# Patient Record
Sex: Female | Born: 1940 | Race: White | Hispanic: No | Smoking: Former smoker
Health system: Southern US, Community
[De-identification: ages and names within clinical notes are randomized; demographics above are authoritative.]

## PROBLEM LIST (undated history)

## (undated) DIAGNOSIS — I219 Acute myocardial infarction, unspecified: Secondary | ICD-10-CM

## (undated) DIAGNOSIS — I10 Essential (primary) hypertension: Secondary | ICD-10-CM

## (undated) DIAGNOSIS — E785 Hyperlipidemia, unspecified: Secondary | ICD-10-CM

## (undated) DIAGNOSIS — T8859XA Other complications of anesthesia, initial encounter: Secondary | ICD-10-CM

## (undated) DIAGNOSIS — M1990 Unspecified osteoarthritis, unspecified site: Secondary | ICD-10-CM

## (undated) DIAGNOSIS — E039 Hypothyroidism, unspecified: Secondary | ICD-10-CM

## (undated) DIAGNOSIS — C801 Malignant (primary) neoplasm, unspecified: Secondary | ICD-10-CM

## (undated) DIAGNOSIS — I2510 Atherosclerotic heart disease of native coronary artery without angina pectoris: Secondary | ICD-10-CM

## (undated) HISTORY — DX: Essential (primary) hypertension: I10

## (undated) HISTORY — DX: Hyperlipidemia, unspecified: E78.5

## (undated) HISTORY — DX: Acute myocardial infarction, unspecified: I21.9

## (undated) HISTORY — PX: EYE SURGERY: SHX253

## (undated) HISTORY — PX: CORONARY ANGIOPLASTY: SHX604

## (undated) HISTORY — PX: TONSILLECTOMY: SUR1361

## (undated) HISTORY — PX: TUBAL LIGATION: SHX77

---

## 1996-08-29 HISTORY — PX: CARPAL TUNNEL RELEASE: SHX101

## 1998-07-27 ENCOUNTER — Ambulatory Visit (HOSPITAL_COMMUNITY): Admission: RE | Admit: 1998-07-27 | Discharge: 1998-07-27 | Payer: Self-pay | Admitting: Obstetrics and Gynecology

## 2004-06-30 ENCOUNTER — Ambulatory Visit (HOSPITAL_BASED_OUTPATIENT_CLINIC_OR_DEPARTMENT_OTHER): Admission: RE | Admit: 2004-06-30 | Discharge: 2004-06-30 | Payer: Self-pay | Admitting: *Deleted

## 2004-06-30 ENCOUNTER — Ambulatory Visit (HOSPITAL_COMMUNITY): Admission: RE | Admit: 2004-06-30 | Discharge: 2004-06-30 | Payer: Self-pay | Admitting: *Deleted

## 2005-08-09 ENCOUNTER — Ambulatory Visit: Payer: Self-pay | Admitting: Sports Medicine

## 2008-08-29 HISTORY — PX: FOOT TENDON SURGERY: SHX958

## 2013-03-13 ENCOUNTER — Telehealth: Payer: Self-pay | Admitting: Cardiology

## 2013-03-13 ENCOUNTER — Other Ambulatory Visit: Payer: Self-pay | Admitting: Cardiology

## 2013-03-13 NOTE — Telephone Encounter (Signed)
Note made in error 03/13/13.  Corine Shelter PA-C 03/13/2013 1:58 PM

## 2017-05-29 ENCOUNTER — Ambulatory Visit (INDEPENDENT_AMBULATORY_CARE_PROVIDER_SITE_OTHER): Payer: Medicare Other | Admitting: Sports Medicine

## 2017-05-29 ENCOUNTER — Encounter: Payer: Self-pay | Admitting: Sports Medicine

## 2017-05-29 DIAGNOSIS — R269 Unspecified abnormalities of gait and mobility: Secondary | ICD-10-CM | POA: Diagnosis not present

## 2017-05-29 DIAGNOSIS — M2170 Unequal limb length (acquired), unspecified site: Secondary | ICD-10-CM | POA: Diagnosis not present

## 2017-05-29 DIAGNOSIS — M4120 Other idiopathic scoliosis, site unspecified: Secondary | ICD-10-CM | POA: Diagnosis not present

## 2017-05-29 NOTE — Progress Notes (Signed)
   Subjective:    Patient ID: Jessica [name], female    DOB: 14-Sep-1940, 76 y.o.   MRN: [id number]  HPI Patient known to this office, previously evaluated for scoliosis and back pain. Now, after seeing PT for several visits, and being diligent with 6 months with back exercises (1 hr per day x 6 days per week x 6 months), she feels like her gait is "off." She does have a known leg length discrepancy, with L longer than R. She notes some L knee lateral knee pain, as well as R ankle weakness, which is not new and she attributes to her hammer toe surgery in the past. She walked with her friend who is a doctor of PT, who noted that the patient was "limping." She has custom lifts on all of her shoes, reports that these were made to correct 3/4".    Feels gait is off Lateral pain on left hip  Review of Systems    no sciatica No back pain Objective:   Physical Exam BP 132/70   Ht 5\' 1"  (1.549 m)   Wt 104 lb (47.2 kg)   BMI 19.65 kg/m  Pleasant very fit older lady Back: scoliosis with curve to patient's right, L SI joint sits higher than R at rest without shoes.  Anterior pelvic rotation on L, posterior rotation on R pelvis.  Leg length: ASIS to med malleolus on L 85.25cm, R 84cm. Gait: L trendenlenberg gait (L hip drops on ambulation) with shoes on (shoes with existing lift).  Note her shift with the correction she has is to her long leg     Assessment & Plan:  Leg length discrepancy Likely 2/2 scoliosis. R leg shortened by approx 1cm. Given corrective lift insoles (patient will need to purchase new shoes without lift) to correct 3/8". Patient to try this with tennis shoes x 1 month and return for additional corrective insoles if this improves gait. Also given strengthening exercises (standing hip rotation, lateral and cross over step ups, and lying lateral leg raises) to strengthen hips.    Ralene Ok, MD  I observed and examined the patient with the resident and agree with  assessment and plan.  Note reviewed and modified by me. Stefanie Libel, MD

## 2017-05-29 NOTE — Assessment & Plan Note (Signed)
Likely 2/2 scoliosis. R leg shortened by approx 1cm. Given corrective lift insoles (patient will need to purchase new shoes without lift) to correct 3/8". Patient to try this with tennis shoes x 1 month and return for additional corrective insoles if this improves gait. Also given strengthening exercises (standing hip rotation, lateral and cross over step ups, and lying lateral leg raises) to strengthen hips.

## 2017-06-29 ENCOUNTER — Ambulatory Visit (INDEPENDENT_AMBULATORY_CARE_PROVIDER_SITE_OTHER): Payer: Medicare Other | Admitting: Sports Medicine

## 2017-06-29 ENCOUNTER — Encounter: Payer: Self-pay | Admitting: Sports Medicine

## 2017-06-29 DIAGNOSIS — M2170 Unequal limb length (acquired), unspecified site: Secondary | ICD-10-CM | POA: Diagnosis not present

## 2017-06-29 DIAGNOSIS — R269 Unspecified abnormalities of gait and mobility: Secondary | ICD-10-CM

## 2017-06-29 NOTE — Progress Notes (Signed)
   HPI  CC: Follow-up scoliosis and leg length discrepancy Patient is presenting today for follow-up regarding her scoliosis and gait abnormality secondary to her leg length discrepancy.  She states that she has had some improvement with the shoe inserts that she was provided at the last visit.  She states that the new shoes that she purchased have begun to push her "outward" which ultimately forced her to add some additional padding to the lateral aspect of 1 of her inserts.  Ultimately she states that she has had some improvement since her last visit but still does not "walk like I used to".  She denies any new injury, trauma, or falls.  No weakness, numbness, or paresthesias.  Medications/Interventions Tried: Shoe inserts and home exercises  See HPI and/or previous note for associated ROS.  ROS No sciatica No numbness ovr feet  Objective: There were no vitals taken for this visit. YDX:AJOINOMV W Ft. NAD, well groomed, a/o x3, normal affect.  CV: Well-perfused. Warm.  Resp: Non-labored.  Neuro: Sensation intact throughout. No foot drop gross coordination deficits.  Obvious scoliosis with good compensation Gait: without correction she has a trendelenbrug gait., broad base gait with no correction; unremarkable stride without signs of limp or balance issues.  Patient was fitted for a : standard, cushioned, semi-rigid orthotic. The orthotic was heated and afterward the patient stood on the orthotic blank positioned on the orthotic stand. The patient was positioned in subtalar neutral position and 10 degrees of ankle dorsiflexion in a weight bearing stance. After completion of molding, a stable base was applied to the orthotic blank. The blank was ground to a stable position for weight bearing. Size: 7 red EVA Base: On RT we used blue medium density EVA On left/ black thinner EVA that is firm and has a taper Posting:  none Additional orthotic padding:  Post orthotic gait  Now able to  walk with better comfort and no trendelenburg  Assessment and plan:   See problem list   Elberta Leatherwood, MD,MS [facility] 06/29/2017 10:32 AM

## 2017-06-29 NOTE — Assessment & Plan Note (Signed)
Custom orthotics made today The sports insoles helped but she feels much more support in the custom orthotics  Use thes for regular exercise and walking  We may need to add a lift to dress shoes

## 2020-05-19 ENCOUNTER — Ambulatory Visit (INDEPENDENT_AMBULATORY_CARE_PROVIDER_SITE_OTHER): Payer: Medicare Other | Admitting: Sports Medicine

## 2020-05-19 ENCOUNTER — Other Ambulatory Visit: Payer: Self-pay

## 2020-05-19 ENCOUNTER — Ambulatory Visit: Payer: Self-pay

## 2020-05-19 ENCOUNTER — Encounter: Payer: Self-pay | Admitting: Sports Medicine

## 2020-05-19 VITALS — BP 102/74 | Ht 61.0 in | Wt 100.0 lb

## 2020-05-19 DIAGNOSIS — M79662 Pain in left lower leg: Secondary | ICD-10-CM

## 2020-05-19 DIAGNOSIS — M79605 Pain in left leg: Secondary | ICD-10-CM

## 2020-05-19 DIAGNOSIS — M2170 Unequal limb length (acquired), unspecified site: Secondary | ICD-10-CM

## 2020-05-19 NOTE — Assessment & Plan Note (Addendum)
Right leg length is shorter and scoliosis amplifies this - added heel lift to right orthotic with a wedge to outside Walking gait improved with this

## 2020-05-19 NOTE — Assessment & Plan Note (Addendum)
[place] scan consistent with peroneus longus tendon/muscle tear, associated with moderate effusion. Given more weight transfer to left LE due to leg discrepancy in setting of scoliosis, considered fibular stress fracture in differential prior to US imaging  - patient to be placed in air cast for 6 weeks  - rest from lifting and pushing/pulling  Ice massage Wear shoes that support LLI Reck 4 weeks with repeat scan  Note anticoagulation may have increased the bleeding from this

## 2020-05-19 NOTE — Progress Notes (Signed)
PCP: Patient, No Pcp Per  Subjective:   HPI: Patient is a 79 y.o. female here for left lateral leg pain that has been present for about 3 weeks.  Patient reports that she was moving throughout the summer with a lot of lifting and pushing.  Patient denies any sensation of pop or sudden onset of pain but does reports pain that was described as "searing" and radiating from the arch of her left foot up to the middle of her calf muscle.  She reports that this pain quickly resolved but has been experiencing soreness on the lateral aspect of her left lower extremity since the initial searing pain episode.  She also reports that she noticed a change in her leg length discrepancy on the right and was under the impression that it had improved.  She reports that she has pain with crossing her leg to the ankle.  She has been able to ambulate normally but has not been wearing her shoes with custom orthotics from about 4 years ago.  ROM Increasing pain on short walks and in the mornings Pain that was severe at night and wakened her Localized to lateral left calf Denies osteoporotic fractures in past      Objective:  Physical Exam:  Gen: awake, alert, NAD, comfortable in exam room Pulm: breathing unlabored Back/Hip:   + scoliosis.  With right hip positioned lower than left  Post. Shift of pelvis TTP at junction of peroneus longus muscle with tendon on left lower extremity.   Full ROM.  Tenderness with dorsiflexion, palpable edema in area of tenderness  Strength LEs 5/5 all muscle groups.   Sensation intact to light touch bilaterally.  Patient with compensated gait for scoliosis She trendelenburg's to Right - her shorter leg She is walking with flat foot strike on left  Ultrasound of left lateral calf There is a high grad tear of the peroneus longus muscle at the musculotendinous junction of the left calf demonstrated by: Marked hypoechoic change and edema in muscle Increased doppler flow Stump  sign with retraction Scanning below level of tear shows some hypoechoic change without disruption and at the lateral malleolus the tendon appears split Fibula does not show a clear cortical disruption or increased hypoechoic change or doppler flow  Impression: 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 Fields, MD   Assessment & Plan:    Leg pain, lateral, left [place] scan consistent with peroneus longus tendon/muscle tear, associated with moderate effusion. Given more weight transfer to left LE due to leg discrepancy in setting of scoliosis, considered fibular stress fracture in differential prior to US imaging  - patient to be placed in air cast for 6 weeks  - rest from lifting and pushing/pulling    Leg length discrepancy Right leg length discrepancy - added heel lift to right orthotic     Orders Placed This Encounter  Procedures   [place] LIMITED JOINT SPACE STRUCTURES LOW LEFT    Standing Status:   Future    Number of Occurrences:   1    Standing Expiration Date:   05/19/2021    Order Specific Question:   Reason for Exam (SYMPTOM  OR DIAGNOSIS REQUIRED)    Answer:   left lower leg pain    Order Specific Question:   Preferred imaging location?    Answer:   Internal    No orders of the defined types were placed in this encounter.  Riki Sheer  Simmons-Robinson, MD [facility], PGY2 05/19/2020 10:33 AM

## 2020-05-19 NOTE — Patient Instructions (Signed)
Peroneus Longus Tendon/Muscle Rupture   You will need to wear the cast on your left leg to assist with healing for the next few weeks and follow up with Dr. Oneida Alar in 4 weeks.   Please purchase a Wrangler compression sleeve to wear on your left leg.   We have also made adjustments to your orthotics to help with walking in the setting of your leg length discrepancy and scoliosis.

## 2020-06-16 ENCOUNTER — Ambulatory Visit: Payer: Medicare PPO | Admitting: Sports Medicine

## 2020-06-16 ENCOUNTER — Other Ambulatory Visit: Payer: Self-pay

## 2020-06-16 VITALS — BP 139/67 | Ht 60.0 in | Wt 100.0 lb

## 2020-06-16 DIAGNOSIS — G8929 Other chronic pain: Secondary | ICD-10-CM

## 2020-06-16 DIAGNOSIS — M25571 Pain in right ankle and joints of right foot: Secondary | ICD-10-CM | POA: Diagnosis not present

## 2020-06-16 DIAGNOSIS — M79662 Pain in left lower leg: Secondary | ICD-10-CM

## 2020-06-16 NOTE — Progress Notes (Signed)
Jessica [name] is a 79 y.o. female who presents to [facility] today for the following:  Left leg pain Patient was seen on 9/21 and found to have high-grade tear of the peroneus longus muscle at musculotendinous junction of the left calf She was placed in an Aircast for 6 weeks and advised to rest from lifting and pushing/pulling She also has a right leg length discrepancy and a heel lift was added to her right orthotic Overall, she feels that she has been having improvement She has been using her Aircast Minimal pain on left with walking now No pain out of aircast today (4 weeks)  Right Ankle Pain and Weakness Reports that within the last month of being here, she has felt that her right foot has had decreased strength Reports that she had an inversion injury of her right ankle when she was walking [place] Reports that many years ago, she had a hammertoe/bunion surgery repair and thinks that ever since then she has been supinating her right foot more so than her left She states that she feels that she needs to give herself more support on the lateral edge of her foot to keep it from supinating and that when she has done this it has helped her Even reports that she feels unstable when walking the dog   PMH reviewed.  ROS as above. Medications reviewed.  Exam:  BP 139/67   Ht 5' (1.524 m)   Wt 100 lb (45.4 kg)   BMI 19.53 kg/m  Gen: Well NAD MSK:  Right Ankle: - Inspection: pes cavus b/l, no obvious swelling or skin changes - Palpation: No TTP at MT heads, no TTP at base of 5th MT, no TTP over cuboid, no tenderness over navicular prominence, no TTP over lateral or medial malleolus b/l.  - Strength: 4/5 strength in dorsiflexion, plantar flexion, inversion, and eversion on right. - ROM: Full ROM b/l - Neuro/vasc: NV intact distally b/l - Special Tests: Negative anterior drawer.  Negative syndesmotic compression.  She has increased prominence of the lateral  portion of her talar done with inversion on right compared to left. She has overall increase in motion with PF and inversion - Gait: hyper-supination of right foot during gait with lateral rocking  Left Leg: Inspection: Slight swelling over left lateral leg and region of peroneus longus. Palpation: Mild tenderness to palpation along peroneus longus muscle and tendon region on lateral left leg/foot. Strength: 5/5 strength in all fields on left foot. ROM: Full ROM bilaterally  Left lateral leg ultrasound Hypoechoic collection within peroneus longus muscle at proximal aspect of muscle belly, decreased from previous ultrasound.   Peroneal tendons at lateral malleolus visualized with decreased size of left peroneus longus tendon compared to right. Only limited tendon fibers noted at SAX behind lateral malleolus and very    Indicates healing tear of left peroneus longus with some retraction from tendon insertion near the lateral malleolus.  Ultrasound and interpretation by Wolfgang Phoenix. Fields, MD and [place] Constable, DO  No results found.   Assessment and Plan: 1) Pain in left lower leg She is healing from her peroneus longus tendon/muscle tear well.  Effusion is improving on ultrasound.  She is also not having as much pain.  We will have her use Aircast as needed for the next 3 weeks.  Advised to walk as she is able.  Chronic pain of right ankle Her pain is most likely secondary to decreased ability of talar  ligaments given that she has significant talar prominence with inversion of her right foot and decreased ability.  With her gait, she does have some supination of her right foot more so than her left, therefore her heel lift was removed from her right orthotic and a lateral heel wedge with a lateral post was placed.  Patient ambulated in the hallway of clinic with improvement in supination and felt improved stability.  She will see how this does over the next 3 weeks, at that time if she feels  as though she is doing well with this and has more stability, she will bring in just she is to be fitted for green insoles that have similar heel wedge and posting.   [place] Constable, D.O.  PGY-3 Family Medicine  06/16/2020 4:31 PM   I observed and examined the patient with the resident and agree with assessment and plan.  Note reviewed and modified by me.  We can also consider ankle support if the insole corrections and orthotics do not stabilize RT ankle Ila Mcgill, MD

## 2020-06-16 NOTE — Assessment & Plan Note (Addendum)
Her pain is most likely secondary to decreased ability of talar ligaments given that she has significant talar prominence with inversion of her right foot and decreased ability.  With her gait, she does have some supination of her right foot more so than her left, therefore her heel lift was removed from her right orthotic and a lateral heel wedge with a lateral post was placed.  Patient ambulated in the hallway of clinic with improvement in supination and felt improved stability.  She will see how this does over the next 3 weeks, at that time if she feels as though she is doing well with this and has more stability, she will bring in dress shoes is to be fitted for green insoles that have similar heel wedge and posting.

## 2020-06-16 NOTE — Assessment & Plan Note (Signed)
She is healing from her peroneus longus tendon/muscle tear well.  Effusion is improving on ultrasound.  She is also not having as much pain.  We will have her use Aircast as needed for the next 3 weeks.  Advised to walk as she is able.

## 2020-07-07 ENCOUNTER — Other Ambulatory Visit: Payer: Self-pay

## 2020-07-07 ENCOUNTER — Ambulatory Visit: Payer: Medicare PPO | Admitting: Sports Medicine

## 2020-07-07 DIAGNOSIS — M79662 Pain in left lower leg: Secondary | ICD-10-CM | POA: Diagnosis not present

## 2020-07-07 DIAGNOSIS — R269 Unspecified abnormalities of gait and mobility: Secondary | ICD-10-CM | POA: Diagnosis not present

## 2020-07-07 NOTE — Assessment & Plan Note (Signed)
F/U ultrasounds have shown complete tear of peroneus longus She has recovered well Limited pain and no major weakness  We will follow

## 2020-07-07 NOTE — Progress Notes (Signed)
CC; RT foot pain/  F/U peroneus longus rupture  On last visit we added lateral wedge and padding to RT orthotic This clearly helped but she feels that she could have more support Also feels that she gets too much pressure to outside of left foot and wonders if padding would help Walking regularly with less foor pain  Peroneus longus tear quit hurting within days of using long air splint No real pain or swelling now  ROS Does regular exercise for her scoliosis No back pain  PE Thin W F in NAD BP 102/62   Ht 5' (1.524 m)   Wt 100 lb (45.4 kg)   BMI 19.53 kg/m  [place] [facility] Exercise 06/16/2020  Frequency of aerobic exercise (# of days/week) 5  Average time in minutes 30  Frequency of strengthening activities (# of days/week) 2   Patient with obvious scoliosis Gait is quite supinated bilaterally but more on RT Seems steady and turns easily Additon of more lateral padding on RT orthotic  Addition of heel wedge on left orthotic After this she showed improved gait with less supination  Non tender over peroneal tendons No TTP over lateral fibular area  Ultrasound of Peroneal tendons At proximal MSK-tendinous attachment of peroneus longus there remains hypoechoic change, stump sign and retraction distally of tendon on SAX and LAX At malleolus the distal PL tendon appears absent Followed to peroneal tubercle and only minimal tendon fibers noted Hypoechoic seroma in cuboid groove  Impression: complete tear of peronusu longus distally and at proximal M-T attachment  Ultrasound and interpretation by Peterson Ao B. Oneida Alar, MD

## 2020-07-07 NOTE — Assessment & Plan Note (Signed)
With additional lateral wedging and posting she has improved gait Less supination Will try these corrections and follow up

## 2020-07-14 ENCOUNTER — Ambulatory Visit: Payer: Medicare PPO | Admitting: Sports Medicine

## 2020-08-20 ENCOUNTER — Emergency Department
Admission: EM | Admit: 2020-08-20 | Discharge: 2020-08-20 | Disposition: A | Discharge status: Home / Self Care | Payer: Medicare PPO | Attending: Emergency Medicine | Admitting: Emergency Medicine

## 2020-08-20 ENCOUNTER — Emergency Department: Payer: Medicare PPO

## 2020-08-20 DIAGNOSIS — R079 Chest pain, unspecified: Secondary | ICD-10-CM

## 2020-08-20 DIAGNOSIS — G459 Transient cerebral ischemic attack, unspecified: Secondary | ICD-10-CM | POA: Insufficient documentation

## 2020-08-20 DIAGNOSIS — I639 Cerebral infarction, unspecified: Secondary | ICD-10-CM

## 2020-08-20 HISTORY — DX: Cerebral infarction, unspecified: I63.9

## 2020-08-20 LAB — CBC AND DIFFERENTIAL
Absolute NRBC: 0 10*3/uL (ref 0.00–0.00)
Basophils Absolute Automated: 0.03 10*3/uL (ref 0.00–0.08)
Basophils Automated: 0.5 %
Eosinophils Absolute Automated: 0.13 10*3/uL (ref 0.00–0.44)
Eosinophils Automated: 2.4 %
Hematocrit: 47.9 % — ABNORMAL HIGH (ref 34.7–43.7)
Hgb: 15.3 g/dL — ABNORMAL HIGH (ref 11.4–14.8)
Immature Granulocytes Absolute: 0.02 10*3/uL (ref 0.00–0.07)
Immature Granulocytes: 0.4 %
Lymphocytes Absolute Automated: 1.03 10*3/uL (ref 0.42–3.22)
Lymphocytes Automated: 18.8 %
MCH: 29.9 pg (ref 25.1–33.5)
MCHC: 31.9 g/dL (ref 31.5–35.8)
MCV: 93.7 fL (ref 78.0–96.0)
MPV: 10.8 fL (ref 8.9–12.5)
Monocytes Absolute Automated: 0.54 10*3/uL (ref 0.21–0.85)
Monocytes: 9.9 %
Neutrophils Absolute: 3.72 10*3/uL (ref 1.10–6.33)
Neutrophils: 68 %
Nucleated RBC: 0 /100 WBC (ref 0.0–0.0)
Platelets: 266 10*3/uL (ref 142–346)
RBC: 5.11 10*6/uL — ABNORMAL HIGH (ref 3.90–5.10)
RDW: 13 % (ref 11–15)
WBC: 5.47 10*3/uL (ref 3.10–9.50)

## 2020-08-20 LAB — COMPREHENSIVE METABOLIC PANEL
ALT: 19 U/L (ref 0–55)
AST (SGOT): 25 U/L (ref 5–34)
Albumin/Globulin Ratio: 1.4 (ref 0.9–2.2)
Albumin: 4.6 g/dL (ref 3.5–5.0)
Alkaline Phosphatase: 96 U/L (ref 37–106)
Anion Gap: 11 (ref 5.0–15.0)
BUN: 12 mg/dL (ref 7.0–19.0)
Bilirubin, Total: 0.8 mg/dL (ref 0.2–1.2)
CO2: 22 mEq/L (ref 22–29)
Calcium: 10.5 mg/dL — ABNORMAL HIGH (ref 7.9–10.2)
Chloride: 108 mEq/L (ref 100–111)
Creatinine: 0.7 mg/dL (ref 0.6–1.0)
Globulin: 3.3 g/dL (ref 2.0–3.6)
Glucose: 92 mg/dL (ref 70–100)
Potassium: 4.3 mEq/L (ref 3.5–5.1)
Protein, Total: 7.9 g/dL (ref 6.0–8.3)
Sodium: 141 mEq/L (ref 136–145)

## 2020-08-20 LAB — TROPONIN I: Troponin I: <0.01 ng/mL (ref 0.00–0.05)

## 2020-08-20 LAB — PT/INR
PT INR: 1 (ref 0.9–1.1)
PT: 11.4 s (ref 10.1–12.9)

## 2020-08-20 LAB — GFR: EGFR: >60

## 2020-08-20 NOTE — ED Provider Notes (Signed)
[facility] EMERGENCY DEPARTMENT H&P      Visit date: 08/20/2020      CLINICAL SUMMARY          Diagnosis:    .     Final diagnoses:   TIA (transient ischemic attack)         MDM Notes:      Pt with 10 seconds expressive aphasia today  Has RF for CVA  HCT neg    Pt from out of town   Very much does not want to be in the hospital  Is aware of risks    Have discussed with pt and son who have involved her physician daughter  Will [place], pt to return with any new sx, and pt to follow up with neuro at home if she remains asymptomatic  Pt to start ASA      Disposition:         Discharge           I was present for the bedside discharge alongside the patient's ED nurse. Course of visit in the ED and discharge instructions were reviewed with patient and they were given the opportunity to ask any questions regarding their care today. Patient and/ or patient's family verbalized understanding of, and comfort with, instructions and plan.          Discharge Prescriptions     None                        CLINICAL INFORMATION        HPI:      Chief Complaint: Aphasia  .    [place] Rahi Chandonnet is a 79 y.o. female who presents with speech changes  [place] the mall [place] and could not get her works out for < 1 minute  No associated weakness, no changes in balance  Has mild headache  Had heart attack 18 years ago  Not currently on ASA  Was in ED 6 weeks ago for CP, had neg trop x2 and was [place] home  Recently had echo which had some abnormality, but unclear what it was    History obtained from: Patient          ROS:      Positive and negative ROS elements as per HPI.  All other systems reviewed and negative.      Physical Exam:      Pulse 77   BP 168/86   Resp 18   SpO2 99 %   Temp 97.6 F (36.4 C)    Physical Exam  Vitals and nursing note reviewed.   Constitutional:       General: She is not in acute distress.     Appearance: She is well-developed.   HENT:      Head: Normocephalic and atraumatic.   Eyes:      General: No scleral  icterus.     Conjunctiva/sclera: Conjunctivae normal.      Pupils: Pupils are equal, round, and reactive to light.   Cardiovascular:      Rate and Rhythm: Normal rate and regular rhythm.      Heart sounds: Normal heart sounds. No murmur heard.  No friction rub. No gallop.    Pulmonary:      Effort: Pulmonary effort is normal. No respiratory distress.      Breath sounds: Normal breath sounds. No wheezing or rales.   Abdominal:      General: Bowel sounds are normal.  There is no distension.      Palpations: Abdomen is soft.      Tenderness: There is no abdominal tenderness. There is no guarding or rebound.   Musculoskeletal:         General: No deformity. Normal range of motion.      Cervical back: Normal range of motion and neck supple.   Skin:     General: Skin is warm and dry.      Findings: No rash.   Neurological:      Mental Status: She is alert and oriented to person, place, and time.      Cranial Nerves: No cranial nerve deficit.      Comments: Full strength in all extremities, sensation intact throughout  F->N intact  H->S intact  Normal gait, normal tandem gait  No pronator  Nl Romberg                       PAST HISTORY        Primary Care Provider: Pcp, None, MD        PMH/PSH:    .     Past Medical History:   Diagnosis Date    Heart attack     Hyperlipidemia     Hypertension        She has a past surgical history that includes Carpal tunnel release.      Social/Family History:      She reports that she has quit smoking. She does not have any smokeless tobacco history on file. She reports current alcohol use. She reports that she does not use drugs.    History reviewed. No pertinent family history.      Listed Medications on Arrival:    .     Home Medications     Med List Status: In Progress Set By: Jarrett Soho, RN at 08/20/2020  4:40 PM                levothyroxine (SYNTHROID) 100 MCG tablet     Take 100 mcg by mouth Once a day at 6:00am     metoprolol tartrate (LOPRESSOR) 25 MG tablet     Take 25  mg by mouth 2 (two) times daily     ramipril (ALTACE) 10 MG capsule     Take 10 mg by mouth daily         Allergies: She has No Known Allergies.            VISIT INFORMATION        Clinical Course in the ED:            Medications Given in the ED:    .     ED Medication Orders (From admission, onward)    None            Procedures:      Procedures      Interpretations:      Monitor -         interpreted by me: sinus 80s  O2 sat-                   saturation: 100 %; Oxygen use: room air; Interpretation: Normal                  RESULTS        Lab Results:      Results     Procedure Component Value Units Date/Time    Troponin I [[id number]]  Collected: 08/20/20 1642    Specimen: Blood Updated: 08/20/20 1737     Troponin I <0.01 ng/mL     Comprehensive metabolic panel [[id number]]  (Abnormal) Collected: 08/20/20 1642    Specimen: Blood Updated: 08/20/20 1731     Glucose 92 mg/dL      BUN 32.4 mg/dL      Creatinine 0.7 mg/dL      Sodium 401 mEq/L      Potassium 4.3 mEq/L      Chloride 108 mEq/L      CO2 22 mEq/L      Calcium 10.5 mg/dL      Protein, Total 7.9 g/dL      Albumin 4.6 g/dL      AST (SGOT) 25 U/L      ALT 19 U/L      Alkaline Phosphatase 96 U/L      Bilirubin, Total 0.8 mg/dL      Globulin 3.3 g/dL      Albumin/Globulin Ratio 1.4     Anion Gap 11.0    GFR [[id number]] Collected: 08/20/20 1642     Updated: 08/20/20 1731     EGFR >60.0    Prothrombin time/INR [[id number]] Collected: 08/20/20 1642    Specimen: Blood Updated: 08/20/20 1725     PT 11.4 sec      PT INR 1.0    CBC and differential [[id number]]  (Abnormal) Collected: 08/20/20 1642    Specimen: Blood Updated: 08/20/20 1718     WBC 5.47 x10 3/uL      Hgb 15.3 g/dL      Hematocrit 32.9 %      Platelets 266 x10 3/uL      RBC 5.11 x10 6/uL      MCV 93.7 fL      MCH 29.9 pg      MCHC 31.9 g/dL      RDW 13 %      MPV 10.8 fL      Neutrophils 68.0 %      Lymphocytes Automated 18.8 %      Monocytes 9.9 %      Eosinophils Automated 2.4 %      Basophils Automated 0.5  %      Immature Granulocytes 0.4 %      Nucleated RBC 0.0 /100 WBC      Neutrophils Absolute 3.72 x10 3/uL      Lymphocytes Absolute Automated 1.03 x10 3/uL      Monocytes Absolute Automated 0.54 x10 3/uL      Eosinophils Absolute Automated 0.13 x10 3/uL      Basophils Absolute Automated 0.03 x10 3/uL      Immature Granulocytes Absolute 0.02 x10 3/uL      Absolute NRBC 0.00 x10 3/uL               Radiology Results:      CT Head without Contrast   Final Result      1.  No CT evidence of acute territorial infarction or hemorrhage.   2.  Moderate chronic appearing microvascular ischemic changes. There   remains clinical suspicion for acute component, consider MRI.      Garnette Gunner, MD    08/20/2020 3:54 PM                  Scribe Attestation:      No scribe involved in the care of this patient          Randolph Bing, MD  08/21/20 1239

## 2020-08-20 NOTE — Discharge Instructions (Signed)
Dear Amber Franklin:    Thank you for choosing the [facility] Emergency Department, the premier emergency department in the [place] area.  I hope your visit today was EXCELLENT. You will receive a survey via text message that will give you the opportunity to provide feedback to your team about your visit. Please do not hesitate to reach out with any questions!    Specific instructions for your visit today:    You appear to have had a mini stoke or a TIA.  You should start taking a baby aspirin.  Your head CT and labs were reassuring.  You need to follow up with a neurologist when you get home.  Please return to the Emergency Department if you are worse in any way.    IF YOU DO NOT CONTINUE TO IMPROVE OR YOUR CONDITION WORSENS, PLEASE CONTACT YOUR DOCTOR OR RETURN IMMEDIATELY TO THE EMERGENCY DEPARTMENT.    Sincerely,  Raisha Brabender, Alben Spittle, MD  Attending Emergency Physician  [facility] Emergency Department    ONSITE PHARMACY  Our full service onsite pharmacy is located in the ER waiting room.  Open 7 days a week from 9 am to 9 pm.  We accept all major insurances and prices are competitive with major retailers.  Ask your provider to print your prescriptions down to the pharmacy to speed you on your way home.    OBTAINING A PRIMARY CARE APPOINTMENT    Primary care physicians (PCPs, also known as primary care doctors) are either internists or family medicine doctors. Both types of PCPs focus on health promotion, disease prevention, patient education and counseling, and treatment of acute and chronic medical conditions.    If you need a primary care doctor, please call the below number and ask who is receiving new patients.     [facility]  Telephone:  [phone]  [email or web address]    DOCTOR REFERRALS  Call [phone] (available 24 hours a day, 7 days a week) if you need any further referrals and we can help you find a primary care doctor or specialist.  Also, available online at:   [email or web address]    YOUR CONTACT INFORMATION  Before leaving please check with registration to make sure we have an up-to-date contact number.  You can call registration at [phone] to update your information.  For questions about your hospital bill, please call [phone].  For questions about your Emergency Dept Physician bill please call [phone].      FREE HEALTH SERVICES  If you need help with health or social services, please call 2-1-1 for a free referral to resources in your area.  2-1-1 is a free service connecting people with information on health insurance, free clinics, pregnancy, mental health, dental care, food assistance, housing, and substance abuse counseling.  Also, available online at:  http://www.[facility].org    MEDICAL RECORDS AND TESTS  Certain laboratory test results do not come back the same day, for example urine cultures.   We will contact you if other important findings are noted.  Radiology films are often reviewed again to ensure accuracy.  If there is any discrepancy, we will notify you.      Please call [phone] to pick up a complimentary CD of any radiology studies performed.  If you or your doctor would like to request a copy of your medical records, please call [phone].      ORTHOPEDIC INJURY   Please know that significant injuries can exist even when an initial x-ray is  read as normal or negative.  This can occur because some fractures (broken bones) are not initially visible on x-rays.  For this reason, close outpatient follow-up with your primary care doctor or bone specialist (orthopedist) is required.    MEDICATIONS AND FOLLOWUP  Please be aware that some prescription medications can cause drowsiness.  Use caution when driving or operating machinery.    The examination and treatment you have received in our Emergency Department is provided on an emergency basis, and is not intended to be a substitute for your primary care physician.   It is important that your doctor checks you again and that you report any new or remaining problems at that time.      [place]  The nearest 24 hour pharmacy is:    [facility] [place], [place] [address]  [place] [facility]  [facility])  Call to start or finish an application, compare plans, enroll or ask a question.  [place]: [phone]  Web:  Healthcare.gov    Help Enrolling in [place]  586-319-2376 (TOLL-FREE)  [phone] (TTY)  Web:  Http://www.coverva.org    Local Help Enrolling in the [place]  774-389-9541 (MAIN)  Email:  health-help'@nvfs'$ .org  Web:  http://lewis-perez.info/  Address:  [id number] [place], [place] [address]    SEDATING MEDICATIONS  Sedating medications include strong pain medications (e.g. narcotics), muscle relaxers, benzodiazepines (used for anxiety and as muscle relaxers), Benadryl/diphenhydramine and other antihistamines for allergic reactions/itching, and other medications.  If you are unsure if you have received a sedating medication, please ask your physician or nurse.  If you received a sedating medication: DO NOT drive a car. DO NOT operate machinery. DO NOT perform jobs where you need to be alert.  DO NOT drink alcoholic beverages while taking this medicine.     If you get dizzy, sit or lie down at the first signs. Be careful going up and down stairs.  Be extra careful to prevent falls.     Never give this medicine to others.     Keep this medicine out of reach of children.     Do not take or save old medicines. Throw them away when outdated.     Keep all medicines in a cool, dry place. DO NOT keep them in your bathroom medicine cabinet or in a cabinet above the stove.    MEDICATION REFILLS  Please be aware that we cannot refill any prescriptions through the ER. If you need further treatment from what is provided at your  ER visit, please follow up with your primary care doctor or your pain management specialist.    [place]  Did you know [facility] has two freestanding ERs located just a few miles away?  [place] [facility] [place] [facility] [place] have short wait times, easy free parking directly in front of the building and top patient satisfaction scores - and the same Board Certified Emergency Medicine doctors as [facility].

## 2020-08-22 LAB — ECG 12-LEAD
Atrial Rate: 58 {beats}/min
P Axis: 25 degrees
P-R Interval: 146 ms
Q-T Interval: 438 ms
QRS Duration: 90 ms
QTC Calculation (Bezet): 429 ms
R Axis: 8 degrees
T Axis: 140 degrees
Ventricular Rate: 58 {beats}/min

## 2021-10-28 DIAGNOSIS — H401231 Low-tension glaucoma, bilateral, mild stage: Secondary | ICD-10-CM | POA: Diagnosis not present

## 2021-10-28 DIAGNOSIS — R7303 Prediabetes: Secondary | ICD-10-CM | POA: Diagnosis not present

## 2021-10-28 DIAGNOSIS — Z961 Presence of intraocular lens: Secondary | ICD-10-CM | POA: Diagnosis not present

## 2021-11-02 ENCOUNTER — Ambulatory Visit: Payer: Medicare PPO | Admitting: Sports Medicine

## 2021-11-02 DIAGNOSIS — R269 Unspecified abnormalities of gait and mobility: Secondary | ICD-10-CM

## 2021-11-02 DIAGNOSIS — M4120 Other idiopathic scoliosis, site unspecified: Secondary | ICD-10-CM

## 2021-11-02 NOTE — Assessment & Plan Note (Signed)
Patient was fitted for a : standard, cushioned, semi-rigid orthotic. ?The orthotic was heated and afterward the patient stood on the orthotic blank positioned on the orthotic stand. ?The patient was positioned in subtalar neutral position and 10 degrees of ankle dorsiflexion in a weight bearing stance. ?After completion of molding, a stable base was applied to the orthotic blank. ?The blank was ground to a stable position for weight bearing. ?Size: 7 red cambray ?Base: blue med. EVA ?Lateral heel wedge with firm black EVA bilat ?Posting: lateral ray post bilaterally ?Additional orthotic padding: none ? ?At Conclusion patients gait was still supinated but partially corrected and no trendelenburg ?She felt better comfort ? ?

## 2021-11-02 NOTE — Assessment & Plan Note (Signed)
We gave her posture exercises particulalry for neck position ? ?Restart scoliosis series with rotation holding a pole and lateral bending ? ?Gait correction will help lessen her muscle tightness as well ?

## 2021-11-02 NOTE — Progress Notes (Signed)
PCP: Ramonita Lab, NP-C ? ?Subjective:  ? ?HPI: ?Patient is a 81 y.o. female here for orthotics adjustment and back muscle tightness. ? ?Patient with history of scoliosis that has been conservatively managed reports some back muscle tightness for several weeks for which she has been trying home stretching exercises for this but does note some ongoing muscle tightness, most significant on the right lower back. She denies any other concerns and reports that she is active with her gardening and walking her dog daily.  ?She does note that her orthotics have been working well for her in her daily shoes; however, they have worn down and she feels like her foot is rolling outward. She denies any pain at this time but would like to get new orthotics.  ? ?No past medical history on file. ? ?Current Outpatient Medications on File Prior to Visit  ?Medication Sig Dispense Refill  ? budesonide (ENTOCORT EC) 3 MG 24 hr capsule TAKE 3 CAPSULES BY MOUTH EVERY MORNING FOR 6 WEEKS, 2 CAPS FOR 2 WEEKS, 1 CAPSULE FOR 2 WEEKS, THEN STOP  0  ? levothyroxine (SYNTHROID, LEVOTHROID) 100 MCG tablet Take 100 mcg by mouth daily.  2  ? metoprolol succinate (TOPROL-XL) 25 MG 24 hr tablet TAKE 1 2 TABLET BY MOUTH EVERY DAY  2  ? ramipril (ALTACE) 5 MG capsule Take 5 mg by mouth 2 (two) times daily.  2  ? rosuvastatin (CRESTOR) 20 MG tablet Take 20 mg by mouth daily.  2  ? TRAVATAN Z 0.004 % SOLN ophthalmic solution PLACE 1 DROP IN BOTH EYES NIGHTLY AT BEDTIME  6  ? ?No current facility-administered medications on file prior to visit.  ? ? ?No past surgical history on file. ? ?Allergies  ?Allergen Reactions  ? Codeine   ? ? ?BP 138/84   Ht 5' (1.524 m)   Wt 100 lb (45.4 kg)   BMI 19.53 kg/m?  ? ?[place] Adult Exercise 06/16/2020  ?Frequency of aerobic exercise (# of days/week) 5  ?Average time in minutes 30  ?Frequency of strengthening activities (# of days/week) 2  ? ? ?No flowsheet data found. ? ?    ?Objective:  ?Physical  Exam: ? ?Gen: NAD, comfortable in exam room ?MSK:  ?Lumbar spine: Scoliosis present with right hip positioned lower than left. + Pelvis shift.  No TTP over the spinous processes or SI joint; mild paraspinal muscle tenderness extending to right flank. Full active ROM of lumbar spine in flexion and extension without pain. Strength 5/5 in bilateral lower extremities and sensation to light touch intact.  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 No edema or ecchymosis. High arch. No edema or ecchymosis noted. No tenderness to palpation over bone noted. No plantar fascia tenderness. Sensation intact  ?Standing and walking show a supinated gait with increased callus and wear pattern on outside of foot ? ?Cervical spine: head forward 5 deg.  Shoulders protracted ?This is passively correctable ?  ?Assessment & Plan:  ?1. Scoliosis, chronic  ?2. Chronic orthotics use for gait abnormality ? ?Patient is presenting with concerns of right lower muscle tightness in setting of scoliosis. Mild paraspinal and right lower back tenderness without any restricted range of motion on exam. Patient advised for stretching exercises ?She was also fitted for new orthotics during this visit with Dr Oneida Alar  ? ?I observed and examined the patient with the resident and agree with assessment and plan.  Note  reviewed and modified by me. ?Ila Mcgill, MD ?

## 2021-11-12 ENCOUNTER — Other Ambulatory Visit: Payer: Self-pay | Admitting: Family Medicine

## 2021-11-12 DIAGNOSIS — L97929 Non-pressure chronic ulcer of unspecified part of left lower leg with unspecified severity: Secondary | ICD-10-CM

## 2021-11-12 DIAGNOSIS — L03116 Cellulitis of left lower limb: Secondary | ICD-10-CM | POA: Diagnosis not present

## 2021-11-15 ENCOUNTER — Ambulatory Visit
Admission: RE | Admit: 2021-11-15 | Discharge: 2021-11-15 | Disposition: A | Discharge status: Home / Self Care | Payer: Medicare PPO | Source: Ambulatory Visit | Attending: Family Medicine | Admitting: Family Medicine

## 2021-11-15 ENCOUNTER — Other Ambulatory Visit: Payer: Self-pay

## 2021-11-15 DIAGNOSIS — L97929 Non-pressure chronic ulcer of unspecified part of left lower leg with unspecified severity: Secondary | ICD-10-CM | POA: Diagnosis not present

## 2021-11-15 DIAGNOSIS — I998 Other disorder of circulatory system: Secondary | ICD-10-CM | POA: Diagnosis not present

## 2021-11-25 ENCOUNTER — Encounter: Payer: Medicare PPO | Attending: Physician Assistant | Admitting: Physician Assistant

## 2021-11-25 DIAGNOSIS — Z87891 Personal history of nicotine dependence: Secondary | ICD-10-CM | POA: Diagnosis not present

## 2021-11-25 DIAGNOSIS — I10 Essential (primary) hypertension: Secondary | ICD-10-CM | POA: Diagnosis not present

## 2021-11-25 DIAGNOSIS — L97822 Non-pressure chronic ulcer of other part of left lower leg with fat layer exposed: Secondary | ICD-10-CM | POA: Diagnosis not present

## 2021-11-26 NOTE — Progress Notes (Signed)
Jessica, TEEM B. ([id number]) ?Visit Report for 11/25/2021 ?Allergy List Details ?Patient Name: Jessica [name], Jessica B. ?Date of Service: 11/25/2021 8:45 AM ?Medical Record Number: [id number] ?Patient Account Number: [id number] ?Date of Birth/Sex: 1941-06-07 (81 y.o. F) ?Treating RN: Carlene Coria ?Primary Care Klark Vanderhoef: Early Osmond Other Clinician: ?Referring Cleon Thoma: Gaynelle Arabian ?Treating Zaxton Angerer/Extender: Jeri Cos ?Weeks in Treatment: 0 ?Allergies ?Active Allergies ?No Known Allergies ?Allergy Notes ?Electronic Signature(s) ?Signed: 11/26/2021 4:01:15 PM By: Carlene Coria RN ?Entered By: Carlene Coria on 11/25/2021 09:01:09 ?LAVERTA, HARNISCH B. ([id number]) ?-------------------------------------------------------------------------------- ?Arrival Information Details ?Patient Name: Jessica, PARMER B. ?Date of Service: 11/25/2021 8:45 AM ?Medical Record Number: [id number] ?Patient Account Number: [id number] ?Date of Birth/Sex: 12-20-1940 (81 y.o. F) ?Treating RN: Carlene Coria ?Primary Care Daeveon Zweber: Early Osmond Other Clinician: ?Referring Jearlene Bridwell: Gaynelle Arabian ?Treating Keanon Bevins/Extender: Jeri Cos ?Weeks in Treatment: 0 ?Visit Information ?Patient Arrived: Ambulatory ?Arrival Time: 08:58 ?Accompanied By: self ?Transfer Assistance: None ?Patient Identification Verified: Yes ?Secondary Verification Process Completed: Yes ?Patient Requires Transmission-Based No ?Precautions: ?Patient Has Alerts: Yes ?Patient Alerts: ABI L1.1 TBI .72 11-15-21 ?ABI R 1.4 TBI .72 3-20- ?23 ?Electronic Signature(s) ?Signed: 11/26/2021 4:01:15 PM By: Carlene Coria RN ?Entered By: Carlene Coria on 11/25/2021 09:17:07 ?DIVYA, MUNSHI B. ([id number]) ?-------------------------------------------------------------------------------- ?Clinic Level of Care Assessment Details ?Patient Name: Jessica, TRELOAR B. ?Date of Service: 11/25/2021 8:45 AM ?Medical Record Number: [id number] ?Patient Account Number: [id number] ?Date of  Birth/Sex: October 02, 1940 (81 y.o. F) ?Treating RN: Carlene Coria ?Primary Care Audric Venn: Early Osmond Other Clinician: ?Referring Keelynn Furgerson: Gaynelle Arabian ?Treating Demani Mcbrien/Extender: Jeri Cos ?Weeks in Treatment: 0 ?Clinic Level of Care Assessment Items ?TOOL 2 Quantity Score ?X - Use when only an EandM is performed on the INITIAL visit 1 0 ?ASSESSMENTS - Nursing Assessment / Reassessment ?X - General Physical Exam (combine w/ comprehensive assessment (listed just below) when performed on new ?1 20 ?pt. evals) ?X- 1 25 ?Comprehensive Assessment (HX, ROS, Risk Assessments, Wounds Hx, etc.) ?ASSESSMENTS - Wound and Skin Assessment / Reassessment ?X - Simple Wound Assessment / Reassessment - one wound 1 5 ?'[]'$  - 0 ?Complex Wound Assessment / Reassessment - multiple wounds ?'[]'$  - 0 ?Dermatologic / Skin Assessment (not related to wound area) ?ASSESSMENTS - Ostomy and/or Continence Assessment and Care ?'[]'$  - Incontinence Assessment and Management 0 ?'[]'$  - 0 ?Ostomy Care Assessment and Management (repouching, etc.) ?PROCESS - Coordination of Care ?X - Simple Patient / Family Education for ongoing care 1 15 ?'[]'$  - 0 ?Complex (extensive) Patient / Family Education for ongoing care ?X- 1 10 ?Staff obtains Consents, Records, Test Results / Process Orders ?'[]'$  - 0 ?Staff telephones HHA, Nursing Homes / Clarify orders / etc ?'[]'$  - 0 ?Routine Transfer to another Facility (non-emergent condition) ?'[]'$  - 0 ?Routine Hospital Admission (non-emergent condition) ?'[]'$  - 0 ?New Admissions / Biomedical engineer / Ordering NPWT, Apligraf, etc. ?'[]'$  - 0 ?Emergency Hospital Admission (emergent condition) ?X- 1 10 ?Simple Discharge Coordination ?'[]'$  - 0 ?Complex (extensive) Discharge Coordination ?PROCESS - Special Needs ?'[]'$  - Pediatric / Minor Patient Management 0 ?'[]'$  - 0 ?Isolation Patient Management ?'[]'$  - 0 ?Hearing / Language / Visual special needs ?'[]'$  - 0 ?Assessment of Community assistance (transportation, D/C planning, etc.) ?'[]'$  -  0 ?Additional assistance / Altered mentation ?'[]'$  - 0 ?Support Surface(s) Assessment (bed, cushion, seat, etc.) ?INTERVENTIONS - Wound Cleansing / Measurement ?X - Wound Imaging (photographs - any number of wounds) 1 5 ?'[]'$  - 0 ?Wound Tracing (instead of photographs) ?X- 1 5 ?Simple Wound Measurement - one wound ?'[]'$  - 0 ?Complex  Wound Measurement - multiple wounds ?CAMIE, HAUSS B. ([id number]) ?'[]'$  - 0 ?Simple Wound Cleansing - one wound ?'[]'$  - 0 ?Complex Wound Cleansing - multiple wounds ?INTERVENTIONS - Wound Dressings ?X - Small Wound Dressing one or multiple wounds 1 10 ?'[]'$  - 0 ?Medium Wound Dressing one or multiple wounds ?'[]'$  - 0 ?Large Wound Dressing one or multiple wounds ?'[]'$  - 0 ?Application of Medications - injection ?INTERVENTIONS - Miscellaneous ?'[]'$  - External ear exam 0 ?'[]'$  - 0 ?Specimen Collection (cultures, biopsies, blood, body fluids, etc.) ?'[]'$  - 0 ?Specimen(s) / Culture(s) sent or taken to Lab for analysis ?'[]'$  - 0 ?Patient Transfer (multiple staff / Civil Service fast streamer / Similar devices) ?'[]'$  - 0 ?Simple Staple / Suture removal (25 or less) ?'[]'$  - 0 ?Complex Staple / Suture removal (26 or more) ?'[]'$  - 0 ?Hypo / Hyperglycemic Management (close monitor of Blood Glucose) ?'[]'$  - 0 ?Ankle / Brachial Index (ABI) - do not check if billed separately ?Has the patient been seen at the hospital within the last three years: Yes ?Total Score: 105 ?Level Of Care: New/Established - Level ?3 ?Electronic Signature(s) ?Signed: 11/26/2021 4:01:15 PM By: Carlene Coria RN ?Entered ByCarlene Coria on 11/25/2021 09:33:42 ?KAELEY, VINJE B. ([id number]) ?-------------------------------------------------------------------------------- ?Encounter Discharge Information Details ?Patient Name: Jessica, PEYSER B. ?Date of Service: 11/25/2021 8:45 AM ?Medical Record Number: [id number] ?Patient Account Number: [id number] ?Date of Birth/Sex: 08/07/1941 (81 y.o. F) ?Treating RN: Carlene Coria ?Primary Care Jarrod Bodkins: Early Osmond Other  Clinician: ?Referring Angelino Rumery: Gaynelle Arabian ?Treating Lisha Vitale/Extender: Jeri Cos ?Weeks in Treatment: 0 ?Encounter Discharge Information Items Post Procedure Vitals ?Discharge Condition: Stable ?Temperature (?F): 98 ?Ambulatory Status: Ambulatory ?Pulse (bpm): 52 ?Discharge Destination: Home ?Respiratory Rate (breaths/min): 18 ?Transportation: Private Auto ?Blood Pressure (mmHg): 158/77 ?Accompanied By: self ?Schedule Follow-up Appointment: Yes ?Clinical Summary of Care: Patient Declined ?Electronic Signature(s) ?Signed: 11/26/2021 4:01:15 PM By: Carlene Coria RN ?Entered By: Carlene Coria on 11/25/2021 09:48:01 ?ELIZZIE, WESTERGARD B. ([id number]) ?-------------------------------------------------------------------------------- ?Lower Extremity Assessment Details ?Patient Name: MALLORIE, NORROD B. ?Date of Service: 11/25/2021 8:45 AM ?Medical Record Number: [id number] ?Patient Account Number: [id number] ?Date of Birth/Sex: 12-May-1941 (81 y.o. F) ?Treating RN: Carlene Coria ?Primary Care Nazar Kuan: Early Osmond Other Clinician: ?Referring Caidence Higashi: Gaynelle Arabian ?Treating Varun Jourdan/Extender: Jeri Cos ?Weeks in Treatment: 0 ?Edema Assessment ?Assessed: [Left: No] [Right: No] ?Edema: [Left: N] [Right: o] ?Calf ?Left: Right: ?Point of Measurement: 32 cm From Medial Instep 29 cm ?Ankle ?Left: Right: ?Point of Measurement: 11 cm From Medial Instep 17 cm ?Knee To Floor ?Left: Right: ?From Medial Instep 44 cm ?Vascular Assessment ?Pulses: ?Dorsalis Pedis ?Palpable: [Left:Yes] ?Electronic Signature(s) ?Signed: 11/26/2021 4:01:15 PM By: Carlene Coria RN ?Entered By: Carlene Coria on 11/25/2021 09:15:30 ?ELIANY, MCCARTER B. ([id number]) ?-------------------------------------------------------------------------------- ?Multi Wound Chart Details ?Patient Name: BERENIS, CORTER B. ?Date of Service: 11/25/2021 8:45 AM ?Medical Record Number: [id number] ?Patient Account Number: [id number] ?Date of Birth/Sex: Nov 09, 1940 (81 y.o.  F) ?Treating RN: Carlene Coria ?Primary Care Leniya Breit: Early Osmond Other Clinician: ?Referring Trinita Devlin: Gaynelle Arabian ?Treating Israel Wunder/Extender: Jeri Cos ?Weeks in Treatment: 0 ?Vital Signs ?Height(in): 60 ?Pulse(bp

## 2021-11-26 NOTE — Progress Notes (Signed)
Jessica [name], Jessica B. ([id number]) ?Visit Report for 11/25/2021 ?Abuse Risk Screen Details ?Patient Name: Jessica [name], Jessica B. ?Date of Service: 11/25/2021 8:45 AM ?Medical Record Number: [id number] ?Patient Account Number: [id number] ?Date of Birth/Sex: [birth year] (81 y.o. F) ?Treating RN: Carlene Coria ?Primary Care Cinzia Devos: Early Osmond Other Clinician: ?Referring Verlyn Dannenberg: Gaynelle Arabian ?Treating Sonya Pucci/Extender: Jeri Cos ?Weeks in Treatment: 0 ?Abuse Risk Screen Items ?Answer ?ABUSE RISK SCREEN: ?Has anyone close to you tried to hurt or harm you recentlyo No ?Do you feel uncomfortable with anyone in your familyo No ?Has anyone forced you do things that you didnot want to doo No ?Electronic Signature(s) ?Signed: 11/26/2021 4:01:15 PM By: Carlene Coria RN ?Entered By: Carlene Coria on 11/25/2021 09:03:41 ?Jessica [name], Jessica B. ([id number]) ?-------------------------------------------------------------------------------- ?Activities of Daily Living Details ?Patient Name: Jessica [name], Jessica B. ?Date of Service: 11/25/2021 8:45 AM ?Medical Record Number: [id number] ?Patient Account Number: [id number] ?Date of Birth/Sex: 1940/11/04 (81 y.o. F) (81 y.o. F) ?Treating RN: Carlene Coria ?Primary Care Dontavius Keim: Early Osmond Other Clinician: ?Referring Gedalya Jim: Gaynelle Arabian ?Treating Keaundra Stehle/Extender: Jeri Cos ?Weeks in Treatment: 0 ?Activities of Daily Living Items ?Answer ?Activities of Daily Living (Please select one for each item) ?[place] ?Take Medications Completely Able ?Use Telephone Completely Able ?Care for Appearance Completely Able ?Use Toilet Completely Able ?Bath / Shower Completely Able ?Dress Self Completely Able ?Feed Self Completely Able ?Walk Completely Able ?Get In / Out Bed Completely Able ?Housework Completely Able ?Prepare Meals Completely Able ?Handle Money Completely Able ?Shop for Self Completely Able ?Electronic Signature(s) ?Signed: 11/26/2021 4:01:15 PM By: Carlene Coria  RN ?Entered By: Carlene Coria on 11/25/2021 09:04:07 ?Jessica [name], Jessica B. ([id number]) ?-------------------------------------------------------------------------------- ?Education Screening Details ?Patient Name: Jessica [name], Jessica B. ?Date of Service: 11/25/2021 8:45 AM ?Medical Record Number: [id number] ?Patient Account Number: [id number] ?Date of Birth/Sex: 01-Oct-1940 (81 y.o. F) ?Treating RN: Carlene Coria ?Primary Care Alaynah Schutter: Early Osmond Other Clinician: ?Referring Marieanne Marxen: Gaynelle Arabian ?Treating Cougar Imel/Extender: Jeri Cos ?Weeks in Treatment: 0 ?Primary Learner Assessed: Patient ?Learning Preferences/Education Level/Primary Language ?Learning Preference: Explanation ?Highest Education Level: College or Above ?Preferred Language: English ?Cognitive Barrier ?Language Barrier: No ?Translator Needed: No ?Memory Deficit: No ?Emotional Barrier: No ?Cultural/Religious Beliefs Affecting Medical Care: No ?Physical Barrier ?Impaired Vision: No ?Impaired Hearing: No ?Decreased Hand dexterity: No ?Knowledge/Comprehension ?Knowledge Level: Medium ?Comprehension Level: High ?Ability to understand written instructions: High ?Ability to understand verbal instructions: High ?Motivation ?Anxiety Level: Anxious ?Cooperation: Cooperative ?Education Importance: Acknowledges Need ?Interest in Health Problems: Asks Questions ?Perception: Coherent ?Willingness to Engage in Self-Management ?High ?Activities: ?Readiness to Engage in Self-Management ?High ?Activities: ?Electronic Signature(s) ?Signed: 11/26/2021 4:01:15 PM By: Carlene Coria RN ?Entered ByCarlene Coria on 11/25/2021 09:04:54 ?Jessica [name], Jessica B. ([id number]) ?-------------------------------------------------------------------------------- ?Fall Risk Assessment Details ?Patient Name: Jessica [name], Jessica B. ?Date of Service: 11/25/2021 8:45 AM ?Medical Record Number: [id number] ?Patient Account Number: [id number] ?Date of Birth/Sex: 04-Jul-1941 (81 y.o. F) ?Treating RN:  Carlene Coria ?Primary Care Ricca Melgarejo: Early Osmond Other Clinician: ?Referring Rudy Luhmann: Gaynelle Arabian ?Treating Donavin Audino/Extender: Jeri Cos ?Weeks in Treatment: 0 ?Fall Risk Assessment Items ?Have you had 2 or more falls in the last 12 monthso 0 No ?Have you had any fall that resulted in injury in the last 12 monthso 0 No ?FALLS RISK SCREEN ?History of falling - immediate or within 3 months 0 No ?Secondary diagnosis (Do you have 2 or more medical diagnoseso) 0 No ?Ambulatory aid ?None/bed rest/wheelchair/nurse 0 No ?Crutches/cane/walker 0 No ?Furniture 0 No ?Intravenous therapy Access/Saline/Heparin Lock 0 No ?Gait/Transferring ?Normal/ bed rest/ wheelchair 0 No ?Weak (short steps with  or without shuffle, stooped but able to lift head while walking, may ?0 No ?seek support from furniture) ?Impaired (short steps with shuffle, may have difficulty arising from chair, head down, impaired ?0 No ?balance) ?Mental Status ?Oriented to own ability 0 No ?Electronic Signature(s) ?Signed: 11/26/2021 4:01:15 PM By: Carlene Coria RN ?Entered By: Carlene Coria on 11/25/2021 09:05:08 ?Jessica [name], Jessica B. ([id number]) ?-------------------------------------------------------------------------------- ?Foot Assessment Details ?Patient Name: Jessica [name], Jessica B. ?Date of Service: 11/25/2021 8:45 AM ?Medical Record Number: [id number] ?Patient Account Number: [id number] ?Date of Birth/Sex: 07-Feb-1941 (81 y.o. F) ?Treating RN: Carlene Coria ?Primary Care Lavontay Kirk: Early Osmond Other Clinician: ?Referring Kymani Shimabukuro: Gaynelle Arabian ?Treating Lamonica Trueba/Extender: Jeri Cos ?Weeks in Treatment: 0 ?Foot Assessment Items ?Site Locations ?+ = Sensation present, - = Sensation absent, C = Callus, U = Ulcer ?R = Redness, W = Warmth, M = Maceration, PU = Pre-ulcerative lesion ?F = Fissure, S = Swelling, D = Dryness ?Assessment ?Right: Left: ?Other Deformity: No No ?Prior Foot Ulcer: No No ?Prior Amputation: No No ?Charcot Joint: No No ?Ambulatory  Status: Ambulatory Without Help ?Gait: Steady ?Electronic Signature(s) ?Signed: 11/26/2021 4:01:15 PM By: Carlene Coria RN ?Entered By: Carlene Coria on 11/25/2021 09:07:05 ?Jessica [name], Jessica B. ([id number]) ?-------------------------------------------------------------------------------- ?Nutrition Risk Screening Details ?Patient Name: RANEEN, JAFFER B. ?Date of Service: 11/25/2021 8:45 AM ?Medical Record Number: [id number] ?Patient Account Number: [id number] ?Date of Birth/Sex: March 12, 1941 (81 y.o. F) ?Treating RN: Carlene Coria ?Primary Care Estalene Bergey: Early Osmond Other Clinician: ?Referring Chaddrick Brue: Gaynelle Arabian ?Treating Jannat Rosemeyer/Extender: Jeri Cos ?Weeks in Treatment: 0 ?Height (in): 60 ?Weight (lbs): 100 ?Body Mass Index (BMI): 19.5 ?Nutrition Risk Screening Items ?Score Screening ?NUTRITION RISK SCREEN: ?I have an illness or condition that made me change the kind and/or amount of food I eat 0 No ?I eat fewer than two meals per day 0 No ?I eat few fruits and vegetables, or milk products 0 No ?I have three or more drinks of beer, liquor or wine almost every day 0 No ?I have tooth or mouth problems that make it hard for me to eat 0 No ?I don't always have enough money to buy the food I need 0 No ?I eat alone most of the time 0 No ?I take three or more different prescribed or over-the-counter drugs a day 1 Yes ?Without wanting to, I have lost or gained 10 pounds in the last six months 0 No ?I am not always physically able to shop, cook and/or feed myself 0 No ?Nutrition Protocols ?Good Risk Protocol 0 No interventions needed ?Moderate Risk Protocol ?High Risk Proctocol ?Risk Level: Good Risk ?Score: 1 ?Electronic Signature(s) ?Signed: 11/26/2021 4:01:15 PM By: Carlene Coria RN ?Entered By: Carlene Coria on 11/25/2021 09:05:41 ?

## 2021-11-26 NOTE — Progress Notes (Signed)
AIYANNA, AWTREY B. ([id number]) ?Visit Report for 11/25/2021 ?Chief Complaint Document Details ?Patient Name: Jessica [name], Jessica B. ?Date of Service: 11/25/2021 8:45 AM ?Medical Record Number: [id number] ?Patient Account Number: [id number] ?Date of Birth/Sex: 21-Nov-1940 (81 y.o. F) ?Treating RN: Carlene Coria ?Primary Care Provider: Early Osmond Other Clinician: ?Referring Provider: Gaynelle Arabian ?Treating Provider/Extender: Jeri Cos ?Weeks in Treatment: 0 ?Information Obtained from: Patient ?Chief Complaint ?Left LE Ulcer ?Electronic Signature(s) ?Signed: 11/25/2021 10:54:42 AM By: Worthy Keeler PA-C ?Entered By: Worthy Keeler on 11/25/2021 10:54:42 ?Jessica [name], Jessica B. ([id number]) ?-------------------------------------------------------------------------------- ?Debridement Details ?Patient Name: Jessica [name], Jessica B. ?Date of Service: 11/25/2021 8:45 AM ?Medical Record Number: [id number] ?Patient Account Number: [id number] ?Date of Birth/Sex: 1941/03/24 (81 y.o. F) ?Treating RN: Carlene Coria ?Primary Care Provider: Early Osmond Other Clinician: ?Referring Provider: Gaynelle Arabian ?Treating Provider/Extender: Jeri Cos ?Weeks in Treatment: 0 ?Debridement Performed for ?Wound #1 Left Lower Leg ?Assessment: ?Performed By: Physician Tommie Sams., PA-C ?Debridement Type: Chemical/Enzymatic/Mechanical ?Agent Used: Saline and gauze ?Level of Consciousness (Pre- ?Awake and Alert ?procedure): ?Pre-procedure Verification/Time Out ?Yes - 09:38 ?Taken: ?Start Time: 09:38 ?Pain Control: Lidocaine 4% Topical Solution ?Instrument: ?Other : saline and gauze ?Bleeding: Minimum ?End Time: 09:39 ?Procedural Pain: 0 ?Post Procedural Pain: 0 ?Response to Treatment: Procedure was tolerated well ?Level of Consciousness (Post- ?Awake and Alert ?procedure): ?Post Debridement Measurements of Total Wound ?Length: (cm) 1.4 ?Width: (cm) 1.1 ?Depth: (cm) 0.1 ?Volume: (cm?) 0.121 ?Character of Wound/Ulcer Post Debridement:  Improved ?Post Procedure Diagnosis ?Same as Pre-procedure ?Electronic Signature(s) ?Signed: 11/25/2021 4:23:05 PM By: Worthy Keeler PA-C ?Signed: 11/26/2021 4:01:15 PM By: Carlene Coria RN ?Entered ByCarlene Coria on 11/25/2021 09:38:51 ?Jessica [name], Jessica B. ([id number]) ?-------------------------------------------------------------------------------- ?HPI Details ?Patient Name: Jessica [name], Jessica B. ?Date of Service: 11/25/2021 8:45 AM ?Medical Record Number: [id number] ?Patient Account Number: [id number] ?Date of Birth/Sex: Mar 12, 1941 (81 y.o. F) ?Treating RN: Carlene Coria ?Primary Care Provider: Early Osmond Other Clinician: ?Referring Provider: Gaynelle Arabian ?Treating Provider/Extender: Jeri Cos ?Weeks in Treatment: 0 ?History of Present Illness ?HPI Description: 11/25/2021 upon evaluation today patient presents for initial inspection here in our clinic concerning a wound that actually first ?occurred in October 2022 this was when she got stuck by something when she fell and again this was on the more posterior aspect of her left leg. ?Subsequently she ended up having a bandage that was on this and when she pulled the bandage off she states it pulled off some skin along the ?edge. The original wound has closed and unfortunately this area where she pulled off skin has continued to be an issue. Fortunately I do not see ?any signs of active infection at this time although there is some inflammation and irritation I am concerned about the possibility of pyoderma. ?There was some eschar/nonviable tissue covering the surface of the wound that was carefully removed with saline and gauze today after the ?lidocaine had been in place for some time. Nonetheless no sharp debridement was performed per patient request and to be honest I do not think ?it was absolutely necessary based on how the wound appeared today. ?The patient does have a history of hypertension but otherwise has no major medical problems she is not  significantly swollen no signs of edema ?whatsoever. ?Electronic Signature(s) ?Signed: 11/25/2021 11:03:07 AM By: Worthy Keeler PA-C ?Entered By: Worthy Keeler on 11/25/2021 11:03:07 ?Jessica [name], Jessica B. ([id number]) ?-------------------------------------------------------------------------------- ?Physical Exam Details ?Patient Name: Jessica [name], Jessica B. ?Date of Service: 11/25/2021 8:45 AM ?Medical Record Number: [id number] ?Patient Account Number: [id number] ?Date of  Birth/Sex: Jul 29, 1941 (81 y.o. F) ?Treating RN: Carlene Coria ?Primary Care Provider: Early Osmond Other Clinician: ?Referring Provider: Gaynelle Arabian ?Treating Provider/Extender: Jeri Cos ?Weeks in Treatment: 0 ?Constitutional ?patient is hypertensive.. pulse regular and within target range for patient.Marland Kitchen respirations regular, non-labored and within target range for patient.. ?temperature within target range for patient.. Well-nourished and well-hydrated in no acute distress. ?Eyes ?conjunctiva clear no eyelid edema noted. pupils equal round and reactive to light and accommodation. ?Ears, Nose, Mouth, and Throat ?no gross abnormality of ear auricles or external auditory canals. normal hearing noted during conversation. mucus membranes moist. ?Respiratory ?normal breathing without difficulty. ?Cardiovascular ?2+ dorsalis pedis/posterior tibialis pulses. no clubbing, cyanosis, significant edema, <3 sec cap refill. ?Psychiatric ?this patient is able to make decisions and demonstrates good insight into disease process. Alert and Oriented x 3. pleasant and cooperative. ?Notes ?Upon inspection patient's wound bed actually showed signs of good granulation and epithelization at this point. Fortunately I do not see any ?evidence of infection at this time which is good I am concerned about the possibility of something along the lines of pyoderma based on what I ?see however. Overall I think that the patient is likely in the right place I think we can  get this healed she has good blood flow which is excellent as ?well her resting ABI was 1.4 on the right with a 0.82 TBI which is excellent. Obviously I do not see any signs of vascular compromise at this time. ?On the left it was 1.1 from the ABI with a TBI of 0.72 which is also excellent. ?Electronic Signature(s) ?Signed: 11/25/2021 11:04:36 AM By: Worthy Keeler PA-C ?Entered By: Worthy Keeler on 11/25/2021 11:04:35 ?Jessica [name], Jessica B. ([id number]) ?-------------------------------------------------------------------------------- ?Physician Orders Details ?Patient Name: Jessica [name], Jessica B. ?Date of Service: 11/25/2021 8:45 AM ?Medical Record Number: [id number] ?Patient Account Number: [id number] ?Date of Birth/Sex: [birth year] (81 y.o. F) ?Treating RN: Carlene Coria ?Primary Care Provider: Early Osmond Other Clinician: ?Referring Provider: Gaynelle Arabian ?Treating Provider/Extender: Jeri Cos ?Weeks in Treatment: 0 ?Verbal / Phone Orders: No ?Diagnosis Coding ?Follow-up Appointments ?o Return Appointment in 1 week. ?Edema Control - Lymphedema / Segmental Compressive Device / Other ?o Elevate, Exercise Daily and Avoid Standing for Long Periods of Time. ?o Elevate legs to the level of the heart and pump ankles as often as possible ?o Elevate leg(s) parallel to the floor when sitting. ?Wound Treatment ?Wound #1 - Lower Leg Wound Laterality: Left ?Cleanser: [employer]ram Ancillary Kit - 15 Day Supply (DME) (Generic) 3 x Per Week/30 Days ?Discharge Instructions: Use supplies as instructed; Kit contains: (15) Saline Bullets; (15) 3x3 Gauze; 15 pr Gloves ?Topical: Triamcinolone Acetonide Cream, 0.1%, 15 (g) tube 3 x Per Week/30 Days ?Discharge Instructions: periwound and wound ?Primary Dressing: Hydrofera Blue Ready Transfer Foam, 2.5x2.5 (in/in) (DME) (Generic) 3 x Per Week/30 Days ?Discharge Instructions: Apply Hydrofera Blue Ready to wound bed as directed ?Secondary Dressing: (SILICONE BORDER) Zetuvit Plus  SILICONE BORDER Dressing 4x4 (in/in) (DME) (Generic) 3 x Per Week/30 Days ?Discharge Instructions: Please do not put silicone bordered dressings under wraps. Use non-bordered dressing only. ?Patient Medications ?Allergies:

## 2021-12-06 ENCOUNTER — Encounter: Payer: Medicare PPO | Attending: Physician Assistant | Admitting: Physician Assistant

## 2021-12-06 DIAGNOSIS — I10 Essential (primary) hypertension: Secondary | ICD-10-CM | POA: Insufficient documentation

## 2021-12-06 DIAGNOSIS — L988 Other specified disorders of the skin and subcutaneous tissue: Secondary | ICD-10-CM | POA: Diagnosis not present

## 2021-12-06 DIAGNOSIS — L97822 Non-pressure chronic ulcer of other part of left lower leg with fat layer exposed: Secondary | ICD-10-CM | POA: Insufficient documentation

## 2021-12-06 NOTE — Progress Notes (Addendum)
Jessica [name], Jessica B. ([id number]) ?Visit Report for 12/06/2021 ?Chief Complaint Document Details ?Patient Name: Jessica [name], Jessica B. ?Date of Service: 12/06/2021 10:15 AM ?Medical Record Number: [id number] ?Patient Account Number: [id number] ?Date of Birth/Sex: [birth year] (81 y.o. F) ?Treating RN: Carlene Coria ?Primary Care Provider: Early Osmond Other Clinician: ?Referring Provider: Early Osmond ?Treating Provider/Extender: Jeri Cos ?Weeks in Treatment: 1 ?Information Obtained from: Patient ?Chief Complaint ?Left LE Ulcer ?Electronic Signature(s) ?Signed: 12/06/2021 10:21:04 AM By: Worthy Keeler PA-C ?Entered By: Worthy Keeler on 12/06/2021 10:21:04 ?ARFA, LAMARCA B. ([id number]) ?-------------------------------------------------------------------------------- ?Debridement Details ?Patient Name: Jessica [name], Jessica B. ?Date of Service: 12/06/2021 10:15 AM ?Medical Record Number: [id number] ?Patient Account Number: [id number] ?Date of Birth/Sex: May 11, 1941 (81 y.o. F) ?Treating RN: Carlene Coria ?Primary Care Provider: Early Osmond Other Clinician: ?Referring Provider: Early Osmond ?Treating Provider/Extender: Jeri Cos ?Weeks in Treatment: 1 ?Debridement Performed for ?Wound #1 Left Lower Leg ?Assessment: ?Performed By: Physician Tommie Sams., PA-C ?Debridement Type: Debridement ?Level of Consciousness (Pre- ?Awake and Alert ?procedure): ?Pre-procedure Verification/Time Out ?Yes - 10:30 ?Taken: ?Start Time: 10:30 ?Pain Control: Lidocaine 4% Topical Solution ?Total Area Debrided (L x W): 2 (cm) x 1 (cm) = 2 (cm?) ?Tissue and other material ?Viable, Non-Viable, Slough, Subcutaneous, Skin: Dermis , Skin: Epidermis, Biofilm, Slough ?debrided: ?Level: Skin/Subcutaneous Tissue ?Debridement Description: Excisional ?Instrument: Curette ?Bleeding: Minimum ?Hemostasis Achieved: Pressure ?End Time: 10:34 ?Procedural Pain: 0 ?Post Procedural Pain: 0 ?Response to Treatment: Procedure was tolerated well ?Level of  Consciousness (Post- ?Awake and Alert ?procedure): ?Post Debridement Measurements of Total Wound ?Length: (cm) 2 ?Width: (cm) 1 ?Depth: (cm) 0.1 ?Volume: (cm?) 0.157 ?Character of Wound/Ulcer Post Debridement: Improved ?Post Procedure Diagnosis ?Same as Pre-procedure ?Electronic Signature(s) ?Signed: 12/06/2021 4:48:03 PM By: Worthy Keeler PA-C ?Signed: 12/07/2021 8:57:31 AM By: Carlene Coria RN ?Entered By: Carlene Coria on 12/06/2021 10:32:57 ?Jessica [name], Jessica B. ([id number]) ?-------------------------------------------------------------------------------- ?HPI Details ?Patient Name: Jessica [name], Jessica B. ?Date of Service: 12/06/2021 10:15 AM ?Medical Record Number: [id number] ?Patient Account Number: [id number] ?Date of Birth/Sex: 1941-01-16 (81 y.o. F) ?Treating RN: Carlene Coria ?Primary Care Provider: Early Osmond Other Clinician: ?Referring Provider: Early Osmond ?Treating Provider/Extender: Jeri Cos ?Weeks in Treatment: 1 ?History of Present Illness ?HPI Description: 11/25/2021 upon evaluation today patient presents for initial inspection here in our clinic concerning a wound that actually first ?occurred in October 2022 this was when she got stuck by something when she fell and again this was on the more posterior aspect of her left leg. ?Subsequently she ended up having a bandage that was on this and when she pulled the bandage off she states it pulled off some skin along the ?edge. The original wound has closed and unfortunately this area where she pulled off skin has continued to be an issue. Fortunately I do not see ?any signs of active infection at this time although there is some inflammation and irritation I am concerned about the possibility of pyoderma. ?There was some eschar/nonviable tissue covering the surface of the wound that was carefully removed with saline and gauze today after the ?lidocaine had been in place for some time. Nonetheless no sharp debridement was performed per patient request  and to be honest I do not think ?it was absolutely necessary based on how the wound appeared today. ?The patient does have a history of hypertension but otherwise has no major medical problems she is not significantly swollen no signs of edema ?whatsoever. ?12-06-2021 upon evaluation today patient appears to be doing well with regard to her wound. Actually do feel  like that the [facility] with ?triamcinolone has helped significantly with the inflammation that we were seeing. Things are doing much better in that regard. With that being ?said I do not see any signs of infection at this time which is great news. Overall I think she may actually benefit from the use of collagen based on ?what I am seeing. ?Electronic Signature(s) ?Signed: 12/06/2021 11:52:10 AM By: Worthy Keeler PA-C ?Entered By: Worthy Keeler on 12/06/2021 11:52:10 ?Jessica [name], Jessica B. ([id number]) ?-------------------------------------------------------------------------------- ?Physical Exam Details ?Patient Name: Jessica [name], Jessica B. ?Date of Service: 12/06/2021 10:15 AM ?Medical Record Number: [id number] ?Patient Account Number: [id number] ?Date of Birth/Sex: [birth year] (81 y.o. F) ?Treating RN: Carlene Coria ?Primary Care Provider: Early Osmond Other Clinician: ?Referring Provider: Early Osmond ?Treating Provider/Extender: Jeri Cos ?Weeks in Treatment: 1 ?Constitutional ?Well-nourished and well-hydrated in no acute distress. ?Respiratory ?normal breathing without difficulty. ?Psychiatric ?this patient is able to make decisions and demonstrates good insight into disease process. Alert and Oriented x 3. pleasant and cooperative. ?Notes ?Upon inspection patient's wound bed actually showed signs of significant improvement with regard to the overall inflammatory appearance that we ?were noting that the last visit. I am actually very pleased with what I am seeing today and I do believe that the wound though not significantly ?smaller is  definitely showing signs of less irritation in general. We have also gotten all the necrotic tissue off completely which is great news. ?Subsequently I do believe that silver collagen may be a very good option for her at this time. ?Electronic Signature(s) ?Signed: 12/06/2021 11:52:54 AM By: Worthy Keeler PA-C ?Entered By: Worthy Keeler on 12/06/2021 11:52:54 ?Jessica [name], Jessica B. ([id number]) ?-------------------------------------------------------------------------------- ?Physician Orders Details ?Patient Name: Jessica [name], Jessica B. ?Date of Service: 12/06/2021 10:15 AM ?Medical Record Number: [id number] ?Patient Account Number: [id number] ?Date of Birth/Sex: May 21, 1941 (81 y.o. F) ?Treating RN: Carlene Coria ?Primary Care Provider: Early Osmond Other Clinician: ?Referring Provider: Early Osmond ?Treating Provider/Extender: Jeri Cos ?Weeks in Treatment: 1 ?Verbal / Phone Orders: No ?Diagnosis Coding ?Follow-up Appointments ?o Return Appointment in 1 week. ?Edema Control - Lymphedema / Segmental Compressive Device / Other ?o Elevate, Exercise Daily and Avoid Standing for Long Periods of Time. ?o Elevate legs to the level of the heart and pump ankles as often as possible ?o Elevate leg(s) parallel to the floor when sitting. ?Wound Treatment ?Wound #1 - Lower Leg Wound Laterality: Left ?Cleanser: Byram Ancillary Kit - 15 Day Supply (Generic) 3 x Per Week/30 Days ?Discharge Instructions: Use supplies as instructed; Kit contains: (15) Saline Bullets; (15) 3x3 Gauze; 15 pr Gloves ?Primary Dressing: Hydrofera Blue Ready Transfer Foam, 2.5x2.5 (in/in) (Generic) 3 x Per Week/30 Days ?Discharge Instructions: Apply Hydrofera Blue Ready to wound bed as directed ?Secondary Dressing: (SILICONE BORDER) Zetuvit Plus SILICONE BORDER Dressing 4x4 (in/in) (Generic) 3 x Per Week/30 Days ?Discharge Instructions: Please do not put silicone bordered dressings under wraps. Use non-bordered dressing only. ?Electronic  Signature(s) ?Signed: 12/06/2021 4:48:03 PM By: Worthy Keeler PA-C ?Signed: 12/07/2021 8:57:31 AM By: Carlene Coria RN ?Entered By: Carlene Coria on 12/06/2021 10:34:43 ?Jessica [name], Jessica B. ([id number]) ?------------

## 2021-12-07 NOTE — Progress Notes (Signed)
Jessica [name], Jessica B. ([id number]) ?Visit Report for 12/06/2021 ?Arrival Information Details ?Patient Name: Jessica [name], Jessica B. ?Date of Service: 12/06/2021 10:15 AM ?Medical Record Number: [id number] ?Patient Account Number: [id number] ?Date of Birth/Sex: [birth year] (81 y.o. F) ?Treating RN: Carlene Coria ?Primary Care [place]ya Point: Early Osmond Other Clinician: ?Referring Corby Vandenberghe: Early Osmond ?Treating Morganne Haile/Extender: Jeri Cos ?Weeks in Treatment: 1 ?Visit Information History Since Last Visit ?All ordered tests and consults were completed: No ?Patient Arrived: Ambulatory ?Added or deleted any medications: No ?Arrival Time: 10:05 ?Any new allergies or adverse reactions: No ?Accompanied By: self ?Had a fall or experienced change in No ?Transfer Assistance: None ?activities of daily living that may affect ?Patient Identification Verified: Yes ?risk of falls: ?Secondary Verification Process Completed: Yes ?Signs or symptoms of abuse/neglect since last visito No ?Patient Requires Transmission-Based No ?Hospitalized since last visit: No ?Precautions: ?Implantable device outside of the clinic excluding No ?Patient Has Alerts: Yes ?cellular tissue based products placed in the center ?Patient Alerts: 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 ([id number]) ?-------------------------------------------------------------------------------- ?Clinic Level of Care Assessment Details ?Patient Name: Jessica [name], Jessica B. ?Date of Service: 12/06/2021 10:15 AM ?Medical Record Number: [id number] ?Patient Account Number: [id number] ?Date of Birth/Sex: 11/28/1940 (81 y.o. F) ?Treating RN: Carlene Coria ?Primary Care Bryden Darden: Early Osmond Other Clinician: ?Referring Maxey Ransom: Early Osmond ?Treating Elena Cothern/Extender: Jeri Cos ?Weeks in Treatment: 1 ?Clinic Level of Care Assessment Items ?TOOL 4 Quantity Score ?'[]'$  - Use when only an EandM is performed on FOLLOW-UP visit 0 ?ASSESSMENTS - Nursing Assessment / Reassessment ?'[]'$  - Reassessment of Co-morbidities (includes updates in patient status) 0 ?'[]'$  - 0 ?Reassessment of Adherence to Treatment Plan ?ASSESSMENTS - Wound and Skin Assessment / Reassessment ?'[]'$  - Simple Wound Assessment / Reassessment - one wound 0 ?'[]'$  - 0 ?Complex Wound Assessment / Reassessment - multiple wounds ?'[]'$  - 0 ?Dermatologic / Skin Assessment (not related to wound area) ?ASSESSMENTS - Focused Assessment ?'[]'$  - Circumferential Edema Measurements - multi extremities 0 ?'[]'$  - 0 ?Nutritional Assessment / Counseling / Intervention ?'[]'$  - 0 ?Lower Extremity Assessment (monofilament, tuning fork, pulses) ?'[]'$  - 0 ?Peripheral Arterial Disease Assessment (using hand held doppler) ?ASSESSMENTS - Ostomy and/or Continence Assessment and Care ?'[]'$  - Incontinence Assessment and Management 0 ?'[]'$  - 0 ?Ostomy Care Assessment and Management (repouching, etc.) ?PROCESS - Coordination of Care ?'[]'$  - Simple Patient / Family Education for ongoing care 0 ?'[]'$  - 0 ?Complex (extensive) Patient / Family Education for ongoing care ?'[]'$  - 0 ?Staff obtains Consents, Records, Test Results / Process Orders ?'[]'$  - 0 ?Staff telephones HHA, Nursing Homes / Clarify orders / etc ?'[]'$  - 0 ?Routine Transfer to another Facility (non-emergent condition) ?'[]'$  - 0 ?Routine Hospital Admission (non-emergent condition) ?'[]'$  - 0 ?New Admissions / Biomedical engineer / Ordering NPWT, Apligraf, etc. ?'[]'$  - 0 ?Emergency Hospital Admission (emergent condition) ?'[]'$  - 0 ?Simple Discharge Coordination ?'[]'$  - 0 ?Complex (extensive) Discharge Coordination ?PROCESS - Special Needs ?'[]'$  - Pediatric / Minor Patient Management 0 ?'[]'$  - 0 ?Isolation Patient Management ?'[]'$  - 0 ?Hearing / Language / Visual special needs ?'[]'$  - 0 ?Assessment of  Community assistance (transportation, D/C planning, etc.) ?'[]'$  - 0 ?Additional assistance / Altered mentation ?'[]'$  - 0 ?Support Surface(s) Assessment (bed, cushion, seat, etc.) ?INTERVENTIONS - Wound Cleansing / Measurement ?  Jessica [name], Jessica B. ([id number]) ?'[]'$  - 0 ?Simple Wound Cleansing - one wound ?'[]'$  - 0 ?Complex Wound Cleansing - multiple wounds ?'[]'$  - 0 ?Wound Imaging (photographs - any number of wounds) ?'[]'$  - 0 ?Wound Tracing (instead of photographs) ?'[]'$  - 0 ?Simple Wound Measurement - one wound ?'[]'$  - 0 ?Complex Wound Measurement - multiple wounds ?INTERVENTIONS - Wound Dressings ?'[]'$  - Small Wound Dressing one or multiple wounds 0 ?'[]'$  - 0 ?Medium Wound Dressing one or multiple wounds ?'[]'$  - 0 ?Large Wound Dressing one or multiple wounds ?'[]'$  - 0 ?Application of Medications - topical ?'[]'$  - 0 ?Application of Medications - injection ?INTERVENTIONS - Miscellaneous ?'[]'$  - External ear exam 0 ?'[]'$  - 0 ?Specimen Collection (cultures, biopsies, blood, body fluids, etc.) ?'[]'$  - 0 ?Specimen(s) / Culture(s) sent or taken to Lab for analysis ?'[]'$  - 0 ?Patient Transfer (multiple staff / Civil Service fast streamer / Similar devices) ?'[]'$  - 0 ?Simple Staple / Suture removal (25 or less) ?'[]'$  - 0 ?Complex Staple / Suture removal (26 or more) ?'[]'$  - 0 ?Hypo / Hyperglycemic Management (close monitor of Blood Glucose) ?'[]'$  - 0 ?Ankle / Brachial Index (ABI) - do not check if billed separately ?'[]'$  - 0 ?Vital Signs ?Has the patient been seen at the hospital within the last three years: Yes ?Total Score: 0 ?Level Of Care: ____ ?Electronic Signature(s) ?Signed: 12/07/2021 8:57:31 AM By: Carlene Coria RN ?Entered By: Carlene Coria on 12/06/2021 10:33:20 ?Jessica [name], Jessica B. ([id number]) ?-------------------------------------------------------------------------------- ?Encounter Discharge Information Details ?Patient Name: Jessica [name], Jessica B. ?Date of Service: 12/06/2021 10:15 AM ?Medical Record Number: [id number] ?Patient Account Number: [id number] ?Date of  Birth/Sex: Jul 20, 1941 (81 y.o. F) ?Treating RN: Carlene Coria ?Primary Care Malayna Noori: Early Osmond Other Clinician: ?Referring Chauncey Bruno: Early Osmond ?Treating Tushar Enns/Extender: Jeri Cos ?Weeks in Treatment: 1 ?Encounter Discharge Information Items Post Procedure Vitals ?Discharge Condition: Stable ?Temperature (?F): 98 ?Ambulatory Status: Ambulatory ?Pulse (bpm): 83 ?Discharge Destination: Home ?Respiratory Rate (breaths/min): 18 ?Transportation: Private Auto ?Blood Pressure (mmHg): 150/79 ?Accompanied By: self ?Schedule Follow-up Appointment: Yes ?Clinical Summary of Care: Patient Declined ?Electronic Signature(s) ?Signed: 12/07/2021 8:57:31 AM By: Carlene Coria RN ?Entered By: Carlene Coria on 12/06/2021 10:35:28 ?Jessica [name], Jessica B. ([id number]) ?-------------------------------------------------------------------------------- ?Lower Extremity Assessment Details ?Patient Name: Jessica [name], Jessica B. ?Date of Service: 12/06/2021 10:15 AM ?Medical Record Number: [id number] ?Patient Account Number: [id number] ?Date of Birth/Sex: 06-10-1941 (81 y.o. F) ?Treating RN: Carlene Coria ?Primary Care Michon Kaczmarek: Early Osmond Other Clinician: ?Referring Isais Klipfel: Early Osmond ?Treating Dunia Pringle/Extender: Jeri Cos ?Weeks in Treatment: 1 ?Edema Assessment ?Assessed: [Left: No] [Right: No] ?Edema: [Left: N] [Right: o] ?Calf ?Left: Right: ?Point of Measurement: 32 cm From Medial Instep 29 cm ?Ankle ?Left: Right: ?Point of Measurement: 11 cm From Medial Instep 17 cm ?Vascular Assessment ?Pulses: ?Dorsalis Pedis ?Palpable: [Left:Yes] ?Electronic Signature(s) ?Signed: 12/07/2021 8:57:31 AM By: Carlene Coria RN ?Entered By: Carlene Coria on 12/06/2021 10:15:03 ?Jessica [name], Jessica B. ([id number]) ?-------------------------------------------------------------------------------- ?Multi Wound Chart Details ?Patient Name: Jessica [name], Jessica B. ?Date of Service: 12/06/2021 10:15 AM ?Medical Record Number: [id number] ?Patient Account Number:  [id number] ?Date of Birth/Sex: 14-Aug-1941 (81 y.o. F) ?Treating RN: Carlene Coria ?Primary Care Chou Busler: Early Osmond Other Clinician: ?Referring Laityn Bensen: Early Osmond ?Treating Judah Carchi/Extender: Jeri Cos ?Weeks

## 2021-12-13 ENCOUNTER — Encounter: Payer: Medicare PPO | Admitting: Physician Assistant

## 2021-12-13 DIAGNOSIS — L97822 Non-pressure chronic ulcer of other part of left lower leg with fat layer exposed: Secondary | ICD-10-CM | POA: Diagnosis not present

## 2021-12-13 DIAGNOSIS — L988 Other specified disorders of the skin and subcutaneous tissue: Secondary | ICD-10-CM | POA: Diagnosis not present

## 2021-12-13 DIAGNOSIS — I10 Essential (primary) hypertension: Secondary | ICD-10-CM | POA: Diagnosis not present

## 2021-12-14 NOTE — Progress Notes (Signed)
AAMNA, MALLOZZI B. ([id number]) ?Visit Report for 12/13/2021 ?Chief Complaint Document Details ?Patient Name: Jessica [name], Jessica B. ?Date of Service: 12/13/2021 10:15 AM ?Medical Record Number: [id number] ?Patient Account Number: [id number] ?Date of Birth/Sex: [birth year] (81 y.o. F) ?Treating RN: Carlene Coria ?Primary Care Provider: Early Osmond Other Clinician: ?Referring Provider: Early Osmond ?Treating Provider/Extender: Jeri Cos ?Weeks in Treatment: 2 ?Information Obtained from: Patient ?Chief Complaint ?Left LE Ulcer ?Electronic Signature(s) ?Signed: 12/13/2021 10:22:22 AM By: Worthy Keeler PA-C ?Entered By: Worthy Keeler on 12/13/2021 10:22:22 ?CELICA, KOTOWSKI B. ([id number]) ?-------------------------------------------------------------------------------- ?Debridement Details ?Patient Name: [name], Jessica B. ?Date of Service: 12/13/2021 10:15 AM ?Medical Record Number: [id number] ?Patient Account Number: [id number] ?Date of Birth/Sex: 09/14/1940 (81 y.o. F) ?Treating RN: Carlene Coria ?Primary Care Provider: Early Osmond Other Clinician: ?Referring Provider: Early Osmond ?Treating Provider/Extender: Jeri Cos ?Weeks in Treatment: 2 ?Debridement Performed for ?Wound #1 Left Lower Leg ?Assessment: ?Performed By: Physician Tommie Sams., PA-C ?Debridement Type: Debridement ?Level of Consciousness (Pre- ?Awake and Alert ?procedure): ?Pre-procedure Verification/Time Out ?Yes - 10:25 ?Taken: ?Start Time: 10:25 ?Pain Control: Lidocaine 4% Topical Solution ?Total Area Debrided (L x W): 1.7 (cm) x 1 (cm) = 1.7 (cm?) ?Tissue and other material ?Viable, Non-Viable, Slough, Subcutaneous, Skin: Dermis , Skin: Epidermis, Biofilm, Slough ?debrided: ?Level: Skin/Subcutaneous Tissue ?Debridement Description: Excisional ?Instrument: Curette ?Bleeding: Moderate ?Hemostasis Achieved: Pressure ?End Time: 10:30 ?Procedural Pain: 0 ?Post Procedural Pain: 0 ?Response to Treatment: Procedure was tolerated well ?Level  of Consciousness (Post- ?Awake and Alert ?procedure): ?Post Debridement Measurements of Total Wound ?Length: (cm) 1.7 ?Width: (cm) 1 ?Depth: (cm) 0.1 ?Volume: (cm?) 0.134 ?Character of Wound/Ulcer Post Debridement: Improved ?Post Procedure Diagnosis ?Same as Pre-procedure ?Electronic Signature(s) ?Signed: 12/13/2021 4:40:44 PM By: Worthy Keeler PA-C ?Signed: 12/14/2021 9:16:22 AM By: Carlene Coria RN ?Entered By: Carlene Coria on 12/13/2021 10:28:59 ?SADIE, HAZELETT B. ([id number]) ?-------------------------------------------------------------------------------- ?HPI Details ?Patient Name: [name], Jessica B. ?Date of Service: 12/13/2021 10:15 AM ?Medical Record Number: [id number] ?Patient Account Number: [id number] ?Date of Birth/Sex: [birth year] (81 y.o. F) ?Treating RN: Carlene Coria ?Primary Care Provider: Early Osmond Other Clinician: ?Referring Provider: Early Osmond ?Treating Provider/Extender: Jeri Cos ?Weeks in Treatment: 2 ?History of Present Illness ?HPI Description: 11/25/2021 upon evaluation today patient presents for initial inspection here in our clinic concerning a wound that actually first ?occurred in October 2022 this was when she got stuck by something when she fell and again this was on the more posterior aspect of her left leg. ?Subsequently she ended up having a bandage that was on this and when she pulled the bandage off she states it pulled off some skin along the ?edge. The original wound has closed and unfortunately this area where she pulled off skin has continued to be an issue. Fortunately I do not see ?any signs of active infection at this time although there is some inflammation and irritation I am concerned about the possibility of pyoderma. ?There was some eschar/nonviable tissue covering the surface of the wound that was carefully removed with saline and gauze today after the ?lidocaine had been in place for some time. Nonetheless no sharp debridement was performed per patient  request and to be honest I do not think ?it was absolutely necessary based on how the wound appeared today. ?The patient does have a history of hypertension but otherwise has no major medical problems she is not significantly swollen no signs of edema ?whatsoever. ?12-06-2021 upon evaluation today patient appears to be doing well with regard to her wound. Actually do feel  like that the [facility] with ?triamcinolone has helped significantly with the inflammation that we were seeing. Things are doing much better in that regard. With that being ?said I do not see any signs of infection at this time which is great news. Overall I think she may actually benefit from the use of collagen based on ?what I am seeing. ?12-13-2021 upon evaluation today patient appears to be doing well with regard to her wound. This is measuring a little bit better which is great ?news and overall I do not see any signs of active infection locally nor systemically which is great news. ?Electronic Signature(s) ?Signed: 12/13/2021 1:25:28 PM By: Worthy Keeler PA-C ?Entered By: Worthy Keeler on 12/13/2021 13:25:27 ?LAVERGNE, HILTUNEN B. ([id number]) ?-------------------------------------------------------------------------------- ?Physical Exam Details ?Patient Name: [name], Jessica B. ?Date of Service: 12/13/2021 10:15 AM ?Medical Record Number: [id number] ?Patient Account Number: [id number] ?Date of Birth/Sex: February 18, 1941 (81 y.o. F) ?Treating RN: Carlene Coria ?Primary Care Provider: Early Osmond Other Clinician: ?Referring Provider: Early Osmond ?Treating Provider/Extender: Jeri Cos ?Weeks in Treatment: 2 ?Constitutional ?Well-nourished and well-hydrated in no acute distress. ?Respiratory ?normal breathing without difficulty. ?Psychiatric ?this patient is able to make decisions and demonstrates good insight into disease process. Alert and Oriented x 3. pleasant and cooperative. ?Notes ?Upon inspection patient's wound bed did require  some sharp debridement clear away some of the necrotic debris she tolerated that debridement ?today without complication and postdebridement the wound bed appears to be doing much better. ?Electronic Signature(s) ?Signed: 12/13/2021 1:25:43 PM By: Worthy Keeler PA-C ?Entered By: Worthy Keeler on 12/13/2021 13:25:43 ?JAKEISHA, STRICKER B. ([id number]) ?-------------------------------------------------------------------------------- ?Physician Orders Details ?Patient Name: [name], Jessica B. ?Date of Service: 12/13/2021 10:15 AM ?Medical Record Number: [id number] ?Patient Account Number: [id number] ?Date of Birth/Sex: Dec 09, 1940 (81 y.o. F) ?Treating RN: Carlene Coria ?Primary Care Provider: Early Osmond Other Clinician: ?Referring Provider: Early Osmond ?Treating Provider/Extender: Jeri Cos ?Weeks in Treatment: 2 ?Verbal / Phone Orders: No ?Diagnosis Coding ?Follow-up Appointments ?o Return Appointment in 1 week. ?Edema Control - Lymphedema / Segmental Compressive Device / Other ?o Elevate, Exercise Daily and Avoid Standing for Long Periods of Time. ?o Elevate legs to the level of the heart and pump ankles as often as possible ?o Elevate leg(s) parallel to the floor when sitting. ?Wound Treatment ?Wound #1 - Lower Leg Wound Laterality: Left ?Cleanser: Byram Ancillary Kit - 15 Day Supply (Generic) 3 x Per Week/30 Days ?Discharge Instructions: Use supplies as instructed; Kit contains: (15) Saline Bullets; (15) 3x3 Gauze; 15 pr Gloves ?Primary Dressing: Prisma 4.34 (in) 3 x Per Week/30 Days ?Discharge Instructions: Moisten w/normal saline or sterile water; Cover wound as directed. Do not remove from wound bed. ?Secondary Dressing: (SILICONE BORDER) Zetuvit Plus SILICONE BORDER Dressing 4x4 (in/in) (Generic) 3 x Per Week/30 Days ?Discharge Instructions: Please do not put silicone bordered dressings under wraps. Use non-bordered dressing only. ?Electronic Signature(s) ?Signed: 12/13/2021 4:40:44 PM By: Worthy Keeler PA-C ?Signed: 12/14/2021 9:16:22 AM By: Carlene Coria RN ?Entered By: Carlene Coria on 12/13/2021 10:18:07 ?BATYA, CITRON B. ([id number]) ?-----------------------------------------------------

## 2021-12-14 NOTE — Progress Notes (Signed)
LASHUNA, TAMASHIRO B. ([id number]) ?Visit Report for 12/13/2021 ?Arrival Information Details ?Patient Name: Jessica [name], Jessica B. ?Date of Service: 12/13/2021 10:15 AM ?Medical Record Number: [id number] ?Patient Account Number: [id number] ?Date of Birth/Sex: 1941/04/01 (81 y.o. F) ?Treating RN: Carlene Coria ?Primary Care Meleny Tregoning: Early Osmond Other Clinician: ?Referring Tyr Franca: Early Osmond ?Treating Ladd Cen/Extender: Jeri Cos ?Weeks in Treatment: 2 ?Visit Information History Since Last Visit ?All ordered tests and consults were completed: No ?Patient Arrived: Ambulatory ?Added or deleted any medications: No ?Arrival Time: 10:05 ?Any new allergies or adverse reactions: No ?Accompanied By: self ?Had a fall or experienced change in No ?Transfer Assistance: None ?activities of daily living that may affect ?Patient Identification Verified: Yes ?risk of falls: ?Secondary Verification Process Completed: Yes ?Signs or symptoms of abuse/neglect since last visito No ?Patient Requires Transmission-Based No ?Hospitalized since last visit: No ?Precautions: ?Implantable device outside of the clinic excluding No ?Patient Has Alerts: Yes ?cellular tissue based products placed in the center ?Patient Alerts: ABI L1.1 TBI .72 3-20- ?since last visit: ?23 ?Has Dressing in Place as Prescribed: Yes ?ABI R 1.4 TBI .72 3-20- ?Pain Present Now: No ?23 ?Electronic Signature(s) ?Signed: 12/14/2021 9:16:22 AM By: Carlene Coria RN ?Entered By: Carlene Coria on 12/13/2021 10:09:16 ?CHIANNE, BYRNS B. ([id number]) ?-------------------------------------------------------------------------------- ?Encounter Discharge Information Details ?Patient Name: Jessica [name], Jessica B. ?Date of Service: 12/13/2021 10:15 AM ?Medical Record Number: [id number] ?Patient Account Number: [id number] ?Date of Birth/Sex: [birth year] (80 y.o. F) ?Treating RN: Carlene Coria ?Primary Care Aydian Dimmick: Early Osmond Other Clinician: ?Referring Salome Hautala: Early Osmond ?Treating Vicci Reder/Extender: Jeri Cos ?Weeks in Treatment: 2 ?Encounter Discharge Information Items Post Procedure Vitals ?Discharge Condition: Stable ?Temperature (?F): 98.1 ?Ambulatory Status: Ambulatory ?Pulse (bpm): 58 ?Discharge Destination: Home ?Respiratory Rate (breaths/min): 18 ?Transportation: Private Auto ?Blood Pressure (mmHg): 117/63 ?Accompanied By: self ?Schedule Follow-up Appointment: Yes ?Clinical Summary of Care: Patient Declined ?Electronic Signature(s) ?Signed: 12/14/2021 9:16:22 AM By: Carlene Coria RN ?Entered By: Carlene Coria on 12/13/2021 10:37:17 ?RANEEM, MENDOLIA B. ([id number]) ?-------------------------------------------------------------------------------- ?Lower Extremity Assessment Details ?Patient Name: Jessica [name], Jessica B. ?Date of Service: 12/13/2021 10:15 AM ?Medical Record Number: [id number] ?Patient Account Number: [id number] ?Date of Birth/Sex: Apr 12, 1941 (81 y.o. F) ?Treating RN: Carlene Coria ?Primary Care Senetra Dillin: Early Osmond Other Clinician: ?Referring Cain Fitzhenry: Early Osmond ?Treating Corrie Brannen/Extender: Jeri Cos ?Weeks in Treatment: 2 ?Edema Assessment ?Assessed: [Left: No] [Right: No] ?Edema: [Left: Ye] [Right: s] ?Calf ?Left: Right: ?Point of Measurement: 32 cm From Medial Instep 29 cm ?Ankle ?Left: Right: ?Point of Measurement: 11 cm From Medial Instep 17 cm ?Vascular Assessment ?Pulses: ?Dorsalis Pedis ?Palpable: [Left:Yes] ?Electronic Signature(s) ?Signed: 12/14/2021 9:16:22 AM By: Carlene Coria RN ?Entered By: Carlene Coria on 12/13/2021 10:15:34 ?Jessica [name], Jessica B. ([id number]) ?-------------------------------------------------------------------------------- ?Multi Wound Chart Details ?Patient Name: Jessica [name], Jessica B. ?Date of Service: 12/13/2021 10:15 AM ?Medical Record Number: [id number] ?Patient Account Number: [id number] ?Date of Birth/Sex: [birth year] (81 y.o. F) ?Treating RN: Carlene Coria ?Primary Care Gazelle Towe: Early Osmond Other  Clinician: ?Referring Gladies Sofranko: Early Osmond ?Treating Kyleena Scheirer/Extender: Jeri Cos ?Weeks in Treatment: 2 ?Vital Signs ?Height(in): 60 ?Pulse(bpm): 58 ?Weight(lbs): 100 ?Blood Pressure(mmHg): 117/63 ?Body Mass Index(BMI): 19.5 ?Temperature(??F): 98.1 ?Respiratory Rate(breaths/min): 18 ?Photos: [N/A:N/A] ?Wound Location: Left Lower Leg N/A N/A ?Wounding Event: Trauma N/A N/A ?Primary Etiology: Atypical N/A N/A ?Comorbid History: Hypertension N/A N/A ?Date Acquired: 05/29/2021 N/A N/A ?Weeks of Treatment: 2 N/A N/A ?Wound Status: Open N/A N/A ?Wound Recurrence: No N/A N/A ?Measurements L x W x D (cm) 1.7x1x0.1 N/A N/A ?Area (cm?) : 1.335 N/A N/A ?Volume (cm?) : 0.134 N/A N/A ?%  Reduction in Area: -10.30% N/A N/A ?% Reduction in Volume: -10.70% N/A N/A ?Classification: Full Thickness Without Exposed N/A N/A ?Support Structures ?Exudate Amount: Medium N/A N/A ?Exudate Type: Serosanguineous N/A N/A ?Exudate Color: red, brown N/A N/A ?Granulation Amount: Small (1-33%) N/A N/A ?Necrotic Amount: Large (67-100%) N/A N/A ?Necrotic Tissue: Eschar, Adherent Slough N/A N/A ?Exposed Structures: ?Fat Layer (Subcutaneous Tissue): N/A N/A ?Yes ?Fascia: No ?Tendon: No ?Muscle: No ?Joint: No ?Bone: No ?Epithelialization: None N/A N/A ?Treatment Notes ?Electronic Signature(s) ?Signed: 12/14/2021 9:16:22 AM By: Carlene Coria RN ?Entered By: Carlene Coria on 12/13/2021 10:15:57 ?Jessica [name], Jessica B. ([id number]) ?-------------------------------------------------------------------------------- ?Multi-Disciplinary Care Plan Details ?Patient Name: Jessica [name], Jessica B. ?Date of Service: 12/13/2021 10:15 AM ?Medical Record Number: [id number] ?Patient Account Number: [id number] ?Date of Birth/Sex: October 09, 1940 (81 y.o. F) ?Treating RN: Carlene Coria ?Primary Care Nita Whitmire: Early Osmond Other Clinician: ?Referring Julieann Drummonds: Early Osmond ?Treating Adaliah Hiegel/Extender: Jeri Cos ?Weeks in Treatment: 2 ?Active Inactive ?Wound/Skin  Impairment ?Nursing Diagnoses: ?Knowledge deficit related to ulceration/compromised skin integrity ?Goals: ?Patient/caregiver will verbalize understanding of skin care regimen ?Date Initiated: 11/25/2021 ?Target Resolution Date: 12/26/2021 ?Goal Status: Active ?Ulcer/skin breakdown will have a volume reduction of 30% by week 4 ?Date Initiated: 11/25/2021 ?Target Resolution Date: 12/26/2021 ?Goal Status: Active ?Ulcer/skin breakdown will have a volume reduction of 50% by week 8 ?Date Initiated: 11/25/2021 ?Target Resolution Date: 01/25/2022 ?Goal Status: Active ?Ulcer/skin breakdown will have a volume reduction of 80% by week 12 ?Date Initiated: 11/25/2021 ?Target Resolution Date: 02/25/2022 ?Goal Status: Active ?Ulcer/skin breakdown will heal within 14 weeks ?Date Initiated: 11/25/2021 ?Target Resolution Date: 03/27/2022 ?Goal Status: Active ?Interventions: ?Assess patient/caregiver ability to obtain necessary supplies ?Assess patient/caregiver ability to perform ulcer/skin care regimen upon admission and as needed ?Assess ulceration(s) every visit ?Notes: ?Electronic Signature(s) ?Signed: 12/14/2021 9:16:22 AM By: Carlene Coria RN ?Entered By: Carlene Coria on 12/13/2021 10:15:42 ?Jessica [name], Jessica B. ([id number]) ?-------------------------------------------------------------------------------- ?Pain Assessment Details ?Patient Name: Jessica [name], Jessica B. ?Date of Service: 12/13/2021 10:15 AM ?Medical Record Number: [id number] ?Patient Account Number: [id number] ?Date of Birth/Sex: Jul 19, 1941 (81 y.o. F) ?Treating RN: Carlene Coria ?Primary Care Reed Eifert: Early Osmond Other Clinician: ?Referring Jadi Deyarmin: Early Osmond ?Treating Otha Monical/Extender: Jeri Cos ?Weeks in Treatment: 2 ?Active Problems ?Location of Pain Severity and Description of Pain ?Patient Has Paino No ?Site Locations ?Pain Management and Medication ?Current Pain Management: ?Electronic Signature(s) ?Signed: 12/14/2021 9:16:22 AM By: Carlene Coria RN ?Entered By:  Carlene Coria on 12/13/2021 10:10:37 ?Jessica [name], Jessica B. ([id number]) ?-------------------------------------------------------------------------------- ?Wound Assessment Details ?Patient Name: ANJULI, GEMMILL B. ?Date of Service: 12/13/2021 10:15

## 2021-12-20 ENCOUNTER — Encounter: Payer: Medicare PPO | Admitting: Physician Assistant

## 2021-12-20 DIAGNOSIS — L97822 Non-pressure chronic ulcer of other part of left lower leg with fat layer exposed: Secondary | ICD-10-CM | POA: Diagnosis not present

## 2021-12-20 DIAGNOSIS — I10 Essential (primary) hypertension: Secondary | ICD-10-CM | POA: Diagnosis not present

## 2021-12-20 DIAGNOSIS — L988 Other specified disorders of the skin and subcutaneous tissue: Secondary | ICD-10-CM | POA: Diagnosis not present

## 2021-12-20 NOTE — Progress Notes (Addendum)
ALYNAH, SCHONE B. ([id number]) ?Visit Report for 12/20/2021 ?Arrival Information Details ?Patient Name: Jessica [name], Jessica B. ?Date of Service: 12/20/2021 10:15 AM ?Medical Record Number: [id number] ?Patient Account Number: [id number] ?Date of Birth/Sex: 04-Apr-1941 (81 y.o. F) ?Treating RN: Carlene Coria ?Primary Care Gisel Vipond: Early Osmond Other Clinician: ?Referring Caela Huot: Early Osmond ?Treating Kempton Milne/Extender: Jeri Cos ?Weeks in Treatment: 3 ?Visit Information History Since Last Visit ?All ordered tests and consults were completed: No ?Patient Arrived: Ambulatory ?Added or deleted any medications: No ?Arrival Time: 10:26 ?Any new allergies or adverse reactions: No ?Accompanied By: self ?Had a fall or experienced change in No ?Transfer Assistance: None ?activities of daily living that may affect ?Patient Identification Verified: Yes ?risk of falls: ?Secondary Verification Process Completed: Yes ?Signs or symptoms of abuse/neglect since last visito No ?Patient Requires Transmission-Based No ?Hospitalized since last visit: No ?Precautions: ?Implantable device outside of the clinic excluding No ?Patient Has Alerts: Yes ?cellular tissue based products placed in the center ?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 ([id number]) ?-------------------------------------------------------------------------------- ?Clinic Level of Care Assessment Details ?Patient Name: Jessica [name], Jessica B. ?Date of Service: 12/20/2021 10:15 AM ?Medical Record Number: [id number] ?Patient Account Number: [id number] ?Date of Birth/Sex: 1941-01-20 (81 y.o. F) ?Treating RN: Carlene Coria ?Primary Care Kylle Lall: Early Osmond Other Clinician: ?Referring Chenel Wernli: Early Osmond ?Treating Sita Mangen/Extender: Jeri Cos ?Weeks in Treatment: 3 ?Clinic Level of Care Assessment Items ?TOOL 4 Quantity Score ?X - Use when only an EandM is performed on FOLLOW-UP visit 1 0 ?ASSESSMENTS - Nursing Assessment / Reassessment ?X - Reassessment of Co-morbidities (includes updates in patient status) 1 10 ?X- 1 5 ?Reassessment of Adherence to Treatment Plan ?ASSESSMENTS - Wound and Skin Assessment / Reassessment ?X - Simple Wound Assessment / Reassessment - one wound 1 5 ?'[]'$  - 0 ?Complex Wound Assessment / Reassessment - multiple wounds ?'[]'$  - 0 ?Dermatologic / Skin Assessment (not related to wound area) ?ASSESSMENTS - Focused Assessment ?'[]'$  - Circumferential Edema Measurements - multi extremities 0 ?'[]'$  - 0 ?Nutritional Assessment / Counseling / Intervention ?'[]'$  - 0 ?Lower Extremity Assessment (monofilament, tuning fork, pulses) ?'[]'$  - 0 ?Peripheral Arterial Disease Assessment (using hand held doppler) ?ASSESSMENTS - Ostomy and/or Continence Assessment and Care ?'[]'$  - Incontinence Assessment and Management 0 ?'[]'$  - 0 ?Ostomy Care Assessment and Management (repouching, etc.) ?PROCESS - Coordination of Care ?X - Simple Patient / Family Education for ongoing care 1 15 ?'[]'$  - 0 ?Complex (extensive) Patient / Family Education for ongoing care ?'[]'$  - 0 ?Staff obtains Consents, Records, Test Results / Process Orders ?'[]'$  - 0 ?Staff telephones HHA, Nursing Homes / Clarify orders / etc ?'[]'$  - 0 ?Routine Transfer to another Facility (non-emergent condition) ?'[]'$  - 0 ?Routine Hospital Admission (non-emergent condition) ?'[]'$  - 0 ?New Admissions / Biomedical engineer / Ordering NPWT, Apligraf, etc. ?'[]'$  - 0 ?Emergency Hospital Admission (emergent condition) ?X- 1 10 ?Simple Discharge Coordination ?'[]'$  - 0 ?Complex (extensive) Discharge Coordination ?PROCESS - Special Needs ?'[]'$  - Pediatric / Minor Patient Management 0 ?'[]'$  - 0 ?Isolation Patient Management ?'[]'$  - 0 ?Hearing / Language / Visual special needs ?'[]'$  -  0 ?Assessment of Community assistance (transportation, D/C planning, etc.) ?'[]'$  - 0 ?Additional assistance / Altered mentation ?'[]'$  - 0 ?Support Surface(s) Assessment (bed, cushion, seat, etc.) ?INTERVENTIONS -  Wound Cleansing / Measurement ?Jessica [name], Jessica B. ([id number]) ?X- 1 5 ?Simple Wound Cleansing - one wound ?'[]'$  - 0 ?Complex Wound Cleansing - multiple wounds ?'[]'$  - 0 ?Wound Imaging (photographs - any number of wounds) ?'[]'$  - 0 ?Wound Tracing (instead of photographs) ?X- 1 5 ?Simple Wound Measurement - one wound ?'[]'$  - 0 ?Complex Wound Measurement - multiple wounds ?INTERVENTIONS - Wound Dressings ?X - Small Wound Dressing one or multiple wounds 1 10 ?'[]'$  - 0 ?Medium Wound Dressing one or multiple wounds ?'[]'$  - 0 ?Large Wound Dressing one or multiple wounds ?'[]'$  - 0 ?Application of Medications - topical ?'[]'$  - 0 ?Application of Medications - injection ?INTERVENTIONS - Miscellaneous ?'[]'$  - External ear exam 0 ?'[]'$  - 0 ?Specimen Collection (cultures, biopsies, blood, body fluids, etc.) ?'[]'$  - 0 ?Specimen(s) / Culture(s) sent or taken to Lab for analysis ?'[]'$  - 0 ?Patient Transfer (multiple staff / Civil Service fast streamer / Similar devices) ?'[]'$  - 0 ?Simple Staple / Suture removal (25 or less) ?'[]'$  - 0 ?Complex Staple / Suture removal (26 or more) ?'[]'$  - 0 ?Hypo / Hyperglycemic Management (close monitor of Blood Glucose) ?'[]'$  - 0 ?Ankle / Brachial Index (ABI) - do not check if billed separately ?X- 1 5 ?Vital Signs ?Has the patient been seen at the hospital within the last three years: Yes ?Total Score: 70 ?Level Of Care: New/Established - Level ?2 ?Electronic Signature(s) ?Signed: 12/22/2021 8:47:39 AM By: Carlene Coria RN ?Entered By: Carlene Coria on 12/20/2021 10:40:28 ?Jessica [name], Jessica B. ([id number]) ?-------------------------------------------------------------------------------- ?Encounter Discharge Information Details ?Patient Name: Jessica [name], Jessica B. ?Date of Service: 12/20/2021 10:15 AM ?Medical Record Number: [id number] ?Patient  Account Number: [id number] ?Date of Birth/Sex: Nov 17, 1940 (81 y.o. F) ?Treating RN: Carlene Coria ?Primary Care Elvin Mccartin: Early Osmond Other Clinician: ?Referring Luria Rosario: Early Osmond ?Treating Bryanne Riquelme/Extender: Jeri Cos ?Weeks in Treatment: 3 ?Encounter Discharge Information Items ?Discharge Condition: Stable ?Ambulatory Status: Ambulatory ?Discharge Destination: Home ?Transportation: Private Auto ?Accompanied By: self ?Schedule Follow-up Appointment: Yes ?Clinical Summary of Care: Patient Declined ?Electronic Signature(s) ?Signed: 12/22/2021 8:47:39 AM By: Carlene Coria RN ?Entered By: Carlene Coria on 12/20/2021 10:43:56 ?Jessica [name], Jessica B. ([id number]) ?-------------------------------------------------------------------------------- ?Lower Extremity Assessment Details ?Patient Name: Jessica [name], Jessica B. ?Date of Service: 12/20/2021 10:15 AM ?Medical Record Number: [id number] ?Patient Account Number: [id number] ?Date of Birth/Sex: 08-16-1941 (81 y.o. F) ?Treating RN: Carlene Coria ?Primary Care Tristen Pennino: Early Osmond Other Clinician: ?Referring Tiffancy Moger: Early Osmond ?Treating Sophie Quiles/Extender: Jeri Cos ?Weeks in Treatment: 3 ?Edema Assessment ?Assessed: [Left: No] [Right: No] ?Edema: [Left: Ye] [Right: s] ?Calf ?Left: Right: ?Point of Measurement: 32 cm From Medial Instep 29 cm ?Ankle ?Left: Right: ?Point of Measurement: 11 cm From Medial Instep 17 cm ?Vascular Assessment ?Pulses: ?Dorsalis Pedis ?Palpable: [Left:Yes] ?Electronic Signature(s) ?Signed: 12/22/2021 8:47:39 AM By: Carlene Coria RN ?Entered By: Carlene Coria on 12/20/2021 10:35:31 ?Jessica [name], Jessica B. ([id number]) ?-------------------------------------------------------------------------------- ?Multi Wound Chart Details ?Patient Name: Jessica [name], Jessica B. ?Date of Service: 12/20/2021 10:15 AM ?Medical Record Number: [id number] ?Patient Account Number: [id number] ?Date of Birth/Sex: Sep 21, 1940 (81 y.o. F) ?Treating RN: Carlene Coria ?Primary Care  Lillyona Polasek: Early Osmond Other Clinician: ?Referring Glorian Mcdonell: Early Osmond ?Treating Maurizio Geno/Extender: Jeri Cos ?Weeks in Treatment: 3 ?Vital Signs ?Height(in): 60 ?Pulse(bpm): 64 ?Weight(lbs): 100 ?Blood Pressure(

## 2021-12-20 NOTE — Progress Notes (Addendum)
NEIDA, ELLEGOOD B. ([id number]) ?Visit Report for 12/20/2021 ?Chief Complaint Document Details ?Patient Name: Jessica [name], Jessica B. ?Date of Service: 12/20/2021 10:15 AM ?Medical Record Number: [id number] ?Patient Account Number: [id number] ?Date of Birth/Sex: 15-Nov-1940 (81 y.o. F) ?Treating RN: Carlene Coria ?Primary Care Provider: Early Osmond Other Clinician: ?Referring Provider: Early Osmond ?Treating Provider/Extender: Jeri Cos ?Weeks in Treatment: 3 ?Information Obtained from: Patient ?Chief Complaint ?Left LE Ulcer ?Electronic Signature(s) ?Signed: 12/20/2021 10:28:58 AM By: Worthy Keeler PA-C ?Entered By: Worthy Keeler on 12/20/2021 10:28:57 ?CHRISTY, EHRSAM B. ([id number]) ?-------------------------------------------------------------------------------- ?HPI Details ?Patient Name: Jessica [name], Jessica B. ?Date of Service: 12/20/2021 10:15 AM ?Medical Record Number: [id number] ?Patient Account Number: [id number] ?Date of Birth/Sex: [birth year] (81 y.o. F) ?Treating RN: Carlene Coria ?Primary Care Provider: Early Osmond Other Clinician: ?Referring Provider: Early Osmond ?Treating Provider/Extender: Jeri Cos ?Weeks in Treatment: 3 ?History of Present Illness ?HPI Description: 11/25/2021 upon evaluation today patient presents for initial inspection here in our clinic concerning a wound that actually first ?occurred in October 2022 this was when she got stuck by something when she fell and again this was on the more posterior aspect of her left leg. ?Subsequently she ended up having a bandage that was on this and when she pulled the bandage off she states it pulled off some skin along the ?edge. The original wound has closed and unfortunately this area where she pulled off skin has continued to be an issue. Fortunately I do not see ?any signs of active infection at this time although there is some inflammation and irritation I am concerned about the possibility of pyoderma. ?There was some  eschar/nonviable tissue covering the surface of the wound that was carefully removed with saline and gauze today after the ?lidocaine had been in place for some time. Nonetheless no sharp debridement was performed per patient request and to be honest I do not think ?it was absolutely necessary based on how the wound appeared today. ?The patient does have a history of hypertension but otherwise has no major medical problems she is not significantly swollen no signs of edema ?whatsoever. ?12-06-2021 upon evaluation today patient appears to be doing well with regard to her wound. Actually do feel like that the [facility] with ?triamcinolone has helped significantly with the inflammation that we were seeing. Things are doing much better in that regard. With that being ?said I do not see any signs of infection at this time which is great news. Overall I think she may actually benefit from the use of collagen based on ?what I am seeing. ?12-13-2021 upon evaluation today patient appears to be doing well with regard to her wound. This is measuring a little bit better which is great ?news and overall I do not see any signs of active infection locally nor systemically which is great news. ?12-20-2021 upon evaluation today patient appears to be doing well currently in regard to her wound. This is showing signs of improvement and ?week by week we definitely are seeing improvements in the overall size. I feel like that she is on the right track here. There is minimal discomfort ?she tells me. ?Electronic Signature(s) ?Signed: 12/20/2021 2:04:04 PM By: Worthy Keeler PA-C ?Entered By: Worthy Keeler on 12/20/2021 14:04:04 ?EESHA, SCHMALTZ B. ([id number]) ?-------------------------------------------------------------------------------- ?Physical Exam Details ?Patient Name: Jessica [name], Jessica B. ?Date of Service: 12/20/2021 10:15 AM ?Medical Record Number: [id number] ?Patient Account Number: [id number] ?Date of Birth/Sex:  [birth year] (81 y.o. F) ?Treating RN: Carlene Coria ?Primary Care Provider: Early Osmond Other Clinician: ?  Referring Provider: Early Osmond ?Treating Provider/Extender: Jeri Cos ?Weeks in Treatment: 3 ?Constitutional ?Well-nourished and well-hydrated in no acute distress. ?Respiratory ?normal breathing without difficulty. ?Psychiatric ?this patient is able to make decisions and demonstrates good insight into disease process. Alert and Oriented x 3. pleasant and cooperative. ?Notes ?Upon inspection patient's wound bed showed signs of good granulation and epithelization no significant slough buildup noted and no sharp ?debridement necessary. Overall I think she is doing quite well. ?Electronic Signature(s) ?Signed: 12/20/2021 2:04:36 PM By: Worthy Keeler PA-C ?Entered By: Worthy Keeler on 12/20/2021 14:04:36 ?TEANNA, ELEM B. ([id number]) ?-------------------------------------------------------------------------------- ?Physician Orders Details ?Patient Name: Jessica [name], Jessica B. ?Date of Service: 12/20/2021 10:15 AM ?Medical Record Number: [id number] ?Patient Account Number: [id number] ?Date of Birth/Sex: [birth year] (81 y.o. 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 ?Edema Control - Lymphedema / Segmental Compressive Device / Other ?o Elevate, Exercise Daily and Avoid Standing for Long Periods of Time. ?o Elevate legs to the level of the heart and pump ankles as often as possible ?o Elevate leg(s) parallel to the floor when  sitting. ?Wound Treatment ?Wound #1 - Lower Leg Wound Laterality: Left ?Cleanser: Byram Ancillary Kit - 15 Day Supply (Generic) 3 x Per Week/30 Days ?Discharge Instructions: Use supplies as instructed; Kit contains: (15) Saline Bullets; (15) 3x3 Gauze; 15 pr Gloves ?Primary Dressing: Prisma 4.34 (in) 3 x Per Week/30 Days ?Discharge Instructions: Moisten w/normal saline or sterile water; Cover wound as directed. Do not remove from wound bed. ?Secondary Dressing: (SILICONE BORDER) Zetuvit Plus SILICONE BORDER Dressing 4x4 (in/in) (Generic) 3 x Per Week/30 Days ?Discharge Instructions: Please do not put silicone bordered dressings under wraps. Use non-bordered dressing only. ?Electronic Signature(s) ?Signed: 12/20/2021 5:11:41 PM By: Worthy Keeler PA-C ?Signed: 12/22/2021 8:47:39 AM By: Carlene Coria RN ?Entered By: Carlene Coria on 12/20/2021 10:39:59 ?EMMAGENE, ORTNER B. ([id number]) ?-------------------------------------------------------------------------------- ?Problem List Details ?Patient Name: Jessica [name], Jessica B. ?Date of Service: 12/20/2021 10:15 AM ?Medical Record Number: [id number] ?Patient Account Number: [id number] ?Date of Birth/Sex: 10/17/1940 (81 y.o. 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  ([id number]) ?-------------------------------------------------------------------------------- ?Progress Note Details ?Patient Name: Jessica [name], Jessica B. ?Date of Service: 12/20/2021 10:15 AM ?Medical Record Number: [id number] ?Patient Account Number: [id number] ?Date of Birth/Sex: 1941-06-13

## 2021-12-27 ENCOUNTER — Encounter: Payer: Medicare PPO | Attending: Physician Assistant | Admitting: Physician Assistant

## 2021-12-27 DIAGNOSIS — L97822 Non-pressure chronic ulcer of other part of left lower leg with fat layer exposed: Secondary | ICD-10-CM | POA: Diagnosis not present

## 2021-12-27 DIAGNOSIS — L988 Other specified disorders of the skin and subcutaneous tissue: Secondary | ICD-10-CM | POA: Insufficient documentation

## 2021-12-27 DIAGNOSIS — I10 Essential (primary) hypertension: Secondary | ICD-10-CM | POA: Diagnosis not present

## 2021-12-27 NOTE — Progress Notes (Signed)
RENAY, CRAMMER B. ([id number]) ?Visit Report for 12/27/2021 ?Arrival Information Details ?Patient Name: Jessica [name], Jessica B. ?Date of Service: 12/27/2021 10:15 AM ?Medical Record Number: [id number] ?Patient Account Number: [id number] ?Date of Birth/Sex: 1941-08-19 (81 y.o. F) ?Treating RN: Donnamarie Poag ?Primary Care Marylouise Mallet: Early Osmond Other Clinician: ?Referring Miriah Maruyama: Early Osmond ?Treating Rebacca Votaw/Extender: Jeri Cos ?Weeks in Treatment: 4 ?Visit Information History Since Last Visit ?Added or deleted any medications: No ?Patient Arrived: Ambulatory ?Had a fall or experienced change in No ?Arrival Time: 10:29 ?activities of daily living that may affect ?Accompanied By: self ?risk of falls: ?Transfer Assistance: None ?Hospitalized since last visit: No ?Patient Identification Verified: Yes ?Has Dressing in Place as Prescribed: Yes ?Secondary Verification Process Completed: Yes ?Pain Present Now: No ?Patient Requires Transmission-Based No ?Precautions: ?Patient Has Alerts: Yes ?Patient Alerts: 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 ([id number]) ?-------------------------------------------------------------------------------- ?Clinic Level of Care Assessment Details ?Patient Name: Jessica [name], Jessica B. ?Date of Service: 12/27/2021 10:15 AM ?Medical Record Number: [id number] ?Patient Account Number: [id number] ?Date of Birth/Sex: 09/25/1940 (81 y.o. F) ?Treating RN: Donnamarie Poag ?Primary Care Zerina Hallinan: Early Osmond Other Clinician: ?Referring Keyonia Gluth: Early Osmond ?Treating Consuelo Suthers/Extender: Jeri Cos ?Weeks in Treatment: 4 ?Clinic Level of Care Assessment Items ?TOOL 4 Quantity Score ?'[]'$  - Use when only an EandM is performed on FOLLOW-UP visit 0 ?ASSESSMENTS - Nursing Assessment / Reassessment ?'[]'$  - Reassessment of Co-morbidities (includes updates in  patient status) 0 ?'[]'$  - 0 ?Reassessment of Adherence to Treatment Plan ?ASSESSMENTS - Wound and Skin Assessment / Reassessment ?X - Simple Wound Assessment / Reassessment - one wound 1 5 ?'[]'$  - 0 ?Complex Wound Assessment / Reassessment - multiple wounds ?'[]'$  - 0 ?Dermatologic / Skin Assessment (not related to wound area) ?ASSESSMENTS - Focused Assessment ?'[]'$  - Circumferential Edema Measurements - multi extremities 0 ?'[]'$  - 0 ?Nutritional Assessment / Counseling / Intervention ?'[]'$  - 0 ?Lower Extremity Assessment (monofilament, tuning fork, pulses) ?'[]'$  - 0 ?Peripheral Arterial Disease Assessment (using hand held doppler) ?ASSESSMENTS - Ostomy and/or Continence Assessment and Care ?'[]'$  - Incontinence Assessment and Management 0 ?'[]'$  - 0 ?Ostomy Care Assessment and Management (repouching, etc.) ?PROCESS - Coordination of Care ?X - Simple Patient / Family Education for ongoing care 1 15 ?'[]'$  - 0 ?Complex (extensive) Patient / Family Education for ongoing care ?'[]'$  - 0 ?Staff obtains Consents, Records, Test Results / Process Orders ?'[]'$  - 0 ?Staff telephones HHA, Nursing Homes / Clarify orders / etc ?'[]'$  - 0 ?Routine Transfer to another Facility (non-emergent condition) ?'[]'$  - 0 ?Routine Hospital Admission (non-emergent condition) ?'[]'$  - 0 ?New Admissions / Biomedical engineer / Ordering NPWT, Apligraf, etc. ?'[]'$  - 0 ?Emergency Hospital Admission (emergent condition) ?X- 1 10 ?Simple Discharge Coordination ?'[]'$  - 0 ?Complex (extensive) Discharge Coordination ?PROCESS - Special Needs ?'[]'$  - Pediatric / Minor Patient Management 0 ?'[]'$  - 0 ?Isolation Patient Management ?'[]'$  - 0 ?Hearing / Language / Visual special needs ?'[]'$  - 0 ?Assessment of Community assistance (transportation, D/C planning, etc.) ?'[]'$  - 0 ?Additional assistance / Altered mentation ?'[]'$  - 0 ?Support Surface(s) Assessment (bed, cushion, seat, etc.) ?INTERVENTIONS - Wound Cleansing / Measurement ?PERLA, ECHAVARRIA B. ([id number]) ?X- 1 5 ?Simple Wound Cleansing - one  wound ?'[]'$  - 0 ?Complex Wound Cleansing - multiple wounds ?X- 1 5 ?Wound Imaging (photographs - any number of wounds) ?'[]'$  - 0 ?Wound Tracing (instead of photographs) ?X-  1 5 ?Simple Wound Measurement - one wound ?'[]'$  - 0 ?Complex Wound Measurement - multiple wounds ?INTERVENTIONS - Wound Dressings ?X - Small Wound Dressing one or multiple wounds 1 10 ?'[]'$  - 0 ?Medium Wound Dressing one or multiple wounds ?'[]'$  - 0 ?Large Wound Dressing one or multiple wounds ?X- 1 5 ?Application of Medications - topical ?'[]'$  - 0 ?Application of Medications - injection ?INTERVENTIONS - Miscellaneous ?'[]'$  - External ear exam 0 ?'[]'$  - 0 ?Specimen Collection (cultures, biopsies, blood, body fluids, etc.) ?'[]'$  - 0 ?Specimen(s) / Culture(s) sent or taken to Lab for analysis ?'[]'$  - 0 ?Patient Transfer (multiple staff / Civil Service fast streamer / Similar devices) ?'[]'$  - 0 ?Simple Staple / Suture removal (25 or less) ?'[]'$  - 0 ?Complex Staple / Suture removal (26 or more) ?'[]'$  - 0 ?Hypo / Hyperglycemic Management (close monitor of Blood Glucose) ?'[]'$  - 0 ?Ankle / Brachial Index (ABI) - do not check if billed separately ?X- 1 5 ?Vital Signs ?Has the patient been seen at the hospital within the last three years: Yes ?Total Score: 65 ?Level Of Care: New/Established - Level ?2 ?Electronic Signature(s) ?Signed: 12/27/2021 4:43:11 PM By: Donnamarie Poag ?Entered ByDonnamarie Poag on 12/27/2021 10:44:56 ?ALESSANDRA, SAWDEY B. ([id number]) ?-------------------------------------------------------------------------------- ?Encounter Discharge Information Details ?Patient Name: Jessica [name], Jessica B. ?Date of Service: 12/27/2021 10:15 AM ?Medical Record Number: [id number] ?Patient Account Number: [id number] ?Date of Birth/Sex: 25-Apr-1941 (81 y.o. F) ?Treating RN: Donnamarie Poag ?Primary Care Riccardo Holeman: Early Osmond Other Clinician: ?Referring Rene Sizelove: Early Osmond ?Treating Izzy Courville/Extender: Jeri Cos ?Weeks in Treatment: 4 ?Encounter Discharge Information Items ?Discharge Condition:  Stable ?Ambulatory Status: Ambulatory ?Discharge Destination: Home ?Transportation: Private Auto ?Accompanied By: self ?Schedule Follow-up Appointment: Yes ?Clinical Summary of Care: ?Electronic Signature(s) ?Signed: 12/27/2021 4:43:11 PM By: Donnamarie Poag ?Entered ByDonnamarie Poag on 12/27/2021 10:45:32 ?ALEATHIA, PURDY B. ([id number]) ?-------------------------------------------------------------------------------- ?Lower Extremity Assessment Details ?Patient Name: Jessica [name], Jessica B. ?Date of Service: 12/27/2021 10:15 AM ?Medical Record Number: [id number] ?Patient Account Number: [id number] ?Date of Birth/Sex: Nov 25, 1940 (81 y.o. F) ?Treating RN: Donnamarie Poag ?Primary Care Jaeli Grubb: Early Osmond Other Clinician: ?Referring Ivania Teagarden: Early Osmond ?Treating Juanette Urizar/Extender: Jeri Cos ?Weeks in Treatment: 4 ?Edema Assessment ?Assessed: [Left: Yes] [Right: No] ?Edema: [Left: N] [Right: o] ?Calf ?Left: Right: ?Point of Measurement: 32 cm From Medial Instep 29 cm ?Ankle ?Left: Right: ?Point of Measurement: 11 cm From Medial Instep 17 cm ?Vascular Assessment ?Pulses: ?Dorsalis Pedis ?Palpable: [Left:Yes] ?Electronic Signature(s) ?Signed: 12/27/2021 4:43:11 PM By: Donnamarie Poag ?Entered ByDonnamarie Poag on 12/27/2021 10:36:15 ?YAREXI, PAWLICKI B. ([id number]) ?-------------------------------------------------------------------------------- ?Multi Wound Chart Details ?Patient Name: Jessica [name], Jessica B. ?Date of Service: 12/27/2021 10:15 AM ?Medical Record Number: [id number] ?Patient Account Number: [id number] ?Date of Birth/Sex: 1940/12/31 (81 y.o. F) ?Treating RN: Donnamarie Poag ?Primary Care Breelynn Bankert: Early Osmond Other Clinician: ?Referring Westin Knotts: Early Osmond ?Treating Aeon Kessner/Extender: Jeri Cos ?Weeks in Treatment: 4 ?Vital Signs ?Height(in): 60 ?Pulse(bpm): 71 ?Weight(lbs): 100 ?Blood Pressure(mmHg): 132/74 ?Body Mass Index(BMI): 19.5 ?Temperature(??F): 97.6 ?Respiratory Rate(breaths/min): 16 ?Photos:  [N/A:N/A] ?Wound Location: Left Lower Leg N/A N/A ?Wounding Event: Trauma N/A N/A ?Primary Etiology: Atypical N/A N/A ?Comorbid History: Hypertension N/A N/A ?Date Acquired: 05/29/2021 N/A N/A ?Weeks of Treatment: 4 N/A N/A ?

## 2021-12-27 NOTE — Progress Notes (Addendum)
Jessica [name], Jessica B. ([id number]) ?Visit Report for 12/27/2021 ?Chief Complaint Document Details ?Patient Name: Jessica [name], Jessica B. ?Date of Service: 12/27/2021 10:15 AM ?Medical Record Number: [id number] ?Patient Account Number: [id number] ?Date of Birth/Sex: 07-21-1941 (81 y.o. F) ?Treating RN: Donnamarie Poag ?Primary Care Provider: Early Osmond Other Clinician: ?Referring Provider: Early Osmond ?Treating Provider/Extender: Jeri Cos ?Weeks in Treatment: 4 ?Information Obtained from: Patient ?Chief Complaint ?Left LE Ulcer ?Electronic Signature(s) ?Signed: 12/27/2021 10:39:55 AM By: Worthy Keeler PA-C ?Entered By: Worthy Keeler on 12/27/2021 10:39:55 ?MARYCLAIRE, STOECKER B. ([id number]) ?-------------------------------------------------------------------------------- ?HPI Details ?Patient Name: Jessica [name], Jessica B. ?Date of Service: 12/27/2021 10:15 AM ?Medical Record Number: [id number] ?Patient Account Number: [id number] ?Date of Birth/Sex: 01-25-1941 (81 y.o. F) ?Treating RN: Donnamarie Poag ?Primary Care Provider: Early Osmond Other Clinician: ?Referring Provider: Early Osmond ?Treating Provider/Extender: Jeri Cos ?Weeks in Treatment: 4 ?History of Present Illness ?HPI Description: 11/25/2021 upon evaluation today patient presents for initial inspection here in our clinic concerning a wound that actually first ?occurred in October 2022 this was when she got stuck by something when she fell and again this was on the more posterior aspect of her left leg. ?Subsequently she ended up having a bandage that was on this and when she pulled the bandage off she states it pulled off some skin along the ?edge. The original wound has closed and unfortunately this area where she pulled off skin has continued to be an issue. Fortunately I do not see ?any signs of active infection at this time although there is some inflammation and irritation I am concerned about the possibility of pyoderma. ?There was some eschar/nonviable  tissue covering the surface of the wound that was carefully removed with saline and gauze today after the ?lidocaine had been in place for some time. Nonetheless no sharp debridement was performed per patient request and to be honest I do not think ?it was absolutely necessary based on how the wound appeared today. ?The patient does have a history of hypertension but otherwise has no major medical problems she is not significantly swollen no signs of edema ?whatsoever. ?12-06-2021 upon evaluation today patient appears to be doing well with regard to her wound. Actually do feel like that the [facility] with ?triamcinolone has helped significantly with the inflammation that we were seeing. Things are doing much better in that regard. With that being ?said I do not see any signs of infection at this time which is great news. Overall I think she may actually benefit from the use of collagen based on ?what I am seeing. ?12-13-2021 upon evaluation today patient appears to be doing well with regard to her wound. This is measuring a little bit better which is great ?news and overall I do not see any signs of active infection locally nor systemically which is great news. ?12-20-2021 upon evaluation today patient appears to be doing well currently in regard to her wound. This is showing signs of improvement and ?week by week we definitely are seeing improvements in the overall size. I feel like that she is on the right track here. There is minimal discomfort ?she tells me. ?12-27-2021 upon evaluation today patient's wound does show some signs of being a little bit more moist than what we would like to see. ?Fortunately there does not appear to be any evidence of active infection locally or systemically which is great news. I do think we need to try to ?avoid putting this on as thickly and waiting it down as much I believe  there may be too much moisture. She also tells me that she took a shower ?with the dressing left in place  I think it might of gotten wet through the dressing as well which is also not good I did advise her if she takes a ?shower she needs to make sure to change the dressing. ?Electronic Signature(s) ?Signed: 12/27/2021 11:10:57 AM By: Worthy Keeler PA-C ?Entered By: Worthy Keeler on 12/27/2021 11:10:57 ?Jessica [name], Jessica B. ([id number]) ?-------------------------------------------------------------------------------- ?Physical Exam Details ?Patient Name: Jessica [name], Jessica B. ?Date of Service: 12/27/2021 10:15 AM ?Medical Record Number: [id number] ?Patient Account Number: [id number] ?Date of Birth/Sex: [birth year] (81 y.o. F) ?Treating RN: Donnamarie Poag ?Primary Care Provider: Early Osmond Other Clinician: ?Referring Provider: Early Osmond ?Treating Provider/Extender: Jeri Cos ?Weeks in Treatment: 4 ?Constitutional ?Well-nourished and well-hydrated in no acute distress. ?Respiratory ?normal breathing without difficulty. ?Psychiatric ?this patient is able to make decisions and demonstrates good insight into disease process. Alert and Oriented x 3. pleasant and cooperative. ?Notes ?Upon inspection patient's wound bed actually showed signs of good granulation and epithelization at this point. Fortunately I do not see any ?evidence of active infection locally or systemically which is great news. No fevers, chills, nausea, vomiting, or diarrhea. ?Electronic Signature(s) ?Signed: 12/27/2021 11:11:17 AM By: Worthy Keeler PA-C ?Entered By: Worthy Keeler on 12/27/2021 11:11:17 ?Jessica [name], Jessica B. ([id number]) ?-------------------------------------------------------------------------------- ?Physician Orders Details ?Patient Name: Jessica [name], Jessica B. ?Date of Service: 12/27/2021 10:15 AM ?Medical Record Number: [id number] ?Patient Account Number: [id number] ?Date of Birth/Sex: 1940/09/15 (81 y.o. 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 ?Bathing/ Shower/ Hygiene ?o May shower; gently cleanse wound with antibacterial soap, rinse and pat dry prior to dressing wounds - keep dressing dry or ?change after shower ?o No tub bath. ?Anesthetic (Use 'Patient Medications' Section for Anesthetic Order Entry) ?o Lidocaine applied to wound bed ?Edema Control - Lymphedema / Segmental Compressive Device / Other ?o Elevate, Exercise Daily and Avoid Standing for Long Periods of Time. ?o Elevate legs to the level of the heart and pump ankles as often as possible ?o Elevate leg(s) parallel to the floor when sitting. ?Additional Orders / Instructions ?o Follow Nutritious Diet and Increase Protein Intake ?Wound Treatment ?Wound #1 - Lower Leg Wound Laterality: Left ?Cleanser: Byram Ancillary Kit - 15 Day Supply (Generic) 3 x Per Week/30 Days ?Discharge Instructions: Use supplies as instructed; Kit contains: (15) Saline Bullets; (15) 3x3 Gauze; 15 pr Gloves ?Primary Dressing: Prisma 4.34 (in) 3 x Per Week/30 Days ?Discharge Instructions: Moisten w/normal saline or sterile water; Cover wound as directed. Do not remove from wound bed. ?Secondary Dressing: (SILICONE BORDER) Zetuvit Plus SILICONE BORDER Dressing 4x4 (in/in) (Generic) 3 x Per Week/30 Days ?Discharge Instructions: Please do not put silicone bordered dressings under wraps. Use non-bordered dressing only. ?Electronic Signature(s) ?Signed: 12/27/2021 11:45:50 AM By: Worthy Keeler PA-C ?Signed: 12/27/2021 4:43:11 PM By: Donnamarie Poag ?Entered ByDonnamarie Poag on 12/27/2021 10:44:35 ?Jessica [name], Jessica B.  ([id number]) ?-------------------------------------------------------------------------------- ?Problem List Details ?Patient Name: Jessica [name], Jessica B. ?Date of Service: 12/27/2021 10:15 AM ?Medical Record Number: [id number] ?P

## 2022-01-03 ENCOUNTER — Encounter: Payer: Medicare PPO | Admitting: Internal Medicine

## 2022-01-03 DIAGNOSIS — L97822 Non-pressure chronic ulcer of other part of left lower leg with fat layer exposed: Secondary | ICD-10-CM | POA: Diagnosis not present

## 2022-01-03 DIAGNOSIS — I10 Essential (primary) hypertension: Secondary | ICD-10-CM | POA: Diagnosis not present

## 2022-01-03 DIAGNOSIS — L988 Other specified disorders of the skin and subcutaneous tissue: Secondary | ICD-10-CM | POA: Diagnosis not present

## 2022-01-05 NOTE — Progress Notes (Signed)
Jessica [name], Jessica B. ([id number]) ?Visit Report for 01/03/2022 ?Arrival Information Details ?Patient Name: Jessica [name], Jessica B. ?Date of Service: 01/03/2022 10:15 AM ?Medical Record Number: [id number] ?Patient Account Number: [id number] ?Date of Birth/Sex: 1940-09-09 (81 y.o. F) ?Treating RN: Carlene Coria ?Primary Care Eurika Sandy: Early Osmond Other Clinician: ?Referring Rmani Kellogg: Early Osmond ?Treating Jareth Pardee/Extender: Ricard Dillon ?Weeks in Treatment: 5 ?Visit Information History Since Last Visit ?All ordered tests and consults were completed: No ?Patient Arrived: Ambulatory ?Added or deleted any medications: No ?Arrival Time: 10:14 ?Any new allergies or adverse reactions: No ?Accompanied By: self ?Had a fall or experienced change in No ?Transfer Assistance: None ?activities of daily living that may affect ?Patient Identification Verified: Yes ?risk of falls: ?Secondary Verification Process Completed: Yes ?Signs or symptoms of abuse/neglect since last visito No ?Patient Requires Transmission-Based No ?Hospitalized since last visit: No ?Precautions: ?Implantable device outside of the clinic excluding No ?Patient Has Alerts: Yes ?cellular tissue based products placed in the center ?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 ([id number]) ?-------------------------------------------------------------------------------- ?Clinic Level of Care Assessment Details ?Patient Name: Jessica [name], Jessica B. ?Date of Service: 01/03/2022 10:15 AM ?Medical Record Number: [id number] ?Patient Account Number: [id number] ?Date of Birth/Sex: 1941/07/03 (81 y.o. F) ?Treating RN: Carlene Coria ?Primary Care Sherman Donaldson: Early Osmond Other Clinician: ?Referring Sister Carbone: Early Osmond ?Treating Carollyn Etcheverry/Extender: Ricard Dillon ?Weeks in Treatment: 5 ?Clinic Level of Care Assessment Items ?TOOL 1 Quantity Score ?'[]'$  - Use when EandM and Procedure is performed on INITIAL visit 0 ?ASSESSMENTS - Nursing Assessment / Reassessment ?'[]'$  - General Physical Exam (combine w/ comprehensive assessment (listed just below) when performed on new ?0 ?pt. evals) ?'[]'$  - 0 ?Comprehensive Assessment (HX, ROS, Risk Assessments, Wounds Hx, etc.) ?ASSESSMENTS - Wound and Skin Assessment / Reassessment ?'[]'$  - Dermatologic / Skin Assessment (not related to wound area) 0 ?ASSESSMENTS - Ostomy and/or Continence Assessment and Care ?'[]'$  - Incontinence Assessment and Management 0 ?'[]'$  - 0 ?Ostomy Care Assessment and Management (repouching, etc.) ?PROCESS - Coordination of Care ?'[]'$  - Simple Patient / Family Education for ongoing care 0 ?'[]'$  - 0 ?Complex (extensive) Patient / Family Education for ongoing care ?'[]'$  - 0 ?Staff obtains Consents, Records, Test Results / Process Orders ?'[]'$  - 0 ?Staff telephones HHA, Nursing Homes / Clarify orders / etc ?'[]'$  - 0 ?Routine Transfer to another Facility (non-emergent condition) ?'[]'$  - 0 ?Routine Hospital Admission (non-emergent condition) ?'[]'$  - 0 ?New Admissions / Biomedical engineer / Ordering NPWT, Apligraf, etc. ?'[]'$  - 0 ?Emergency Hospital Admission (emergent condition) ?PROCESS - Special Needs ?'[]'$  - Pediatric / Minor Patient Management 0 ?'[]'$  - 0 ?Isolation Patient Management ?'[]'$  - 0 ?Hearing / Language / Visual special needs ?'[]'$  - 0 ?Assessment of Community assistance (transportation, D/C planning, etc.) ?'[]'$  - 0 ?Additional assistance / Altered mentation ?'[]'$  - 0 ?Support Surface(s) Assessment (bed, cushion, seat, etc.) ?INTERVENTIONS - Miscellaneous ?'[]'$  - External ear exam 0 ?'[]'$  - 0 ?Patient Transfer (multiple staff / Civil Service fast streamer / Similar devices) ?'[]'$  - 0 ?Simple Staple / Suture removal (25 or less) ?'[]'$  - 0 ?Complex Staple / Suture removal (26 or more) ?'[]'$  -  0 ?Hypo/Hyperglycemic Management (do not check if billed separately) ?'[]'$  - 0 ?Ankle / Brachial Index (ABI) - do not check if billed separately ?Has  the patient been seen at the hospital within the last three years: Yes ?Total Score: 0 ?Level Of Care: ____ ?Jessica [name], Jessica B. ([id number]) ?Electronic Signature(s) ?Signed: 01/05/2022 3:40:11 PM By: Carlene Coria RN ?Entered ByCarlene Coria on 01/03/2022 10:38:49 ?Jessica [name], Jessica B. ([id number]) ?-------------------------------------------------------------------------------- ?Encounter Discharge Information Details ?Patient Name: Jessica [name], Jessica B. ?Date of Service: 01/03/2022 10:15 AM ?Medical Record Number: [id number] ?Patient Account Number: [id number] ?Date of Birth/Sex: 10/26/1940 (81 y.o. F) ?Treating RN: Carlene Coria ?Primary Care Narek Kniss: Early Osmond Other Clinician: ?Referring Ashleigh Luckow: Early Osmond ?Treating Yenifer Saccente/Extender: Ricard Dillon ?Weeks in Treatment: 5 ?Encounter Discharge Information Items Post Procedure Vitals ?Discharge Condition: Stable ?Temperature (?F): 97.9 ?Ambulatory Status: Ambulatory ?Pulse (bpm): 64 ?Discharge Destination: Home ?Respiratory Rate (breaths/min): 18 ?Transportation: Private Auto ?Blood Pressure (mmHg): 155/79 ?Accompanied By: self ?Schedule Follow-up Appointment: Yes ?Clinical Summary of Care: Patient Declined ?Electronic Signature(s) ?Signed: 01/05/2022 3:40:11 PM By: Carlene Coria RN ?Entered By: Carlene Coria on 01/03/2022 10:39:57 ?Jessica [name], Jessica B. ([id number]) ?-------------------------------------------------------------------------------- ?Lower Extremity Assessment Details ?Patient Name: Jessica [name], Jessica B. ?Date of Service: 01/03/2022 10:15 AM ?Medical Record Number: [id number] ?Patient Account Number: [id number] ?Date of Birth/Sex: 1941-05-10 (81 y.o. F) ?Treating RN: Carlene Coria ?Primary Care Sally Menard: Early Osmond Other Clinician: ?Referring Jenea Dake: Early Osmond ?Treating Okema Rollinson/Extender:  Ricard Dillon ?Weeks in Treatment: 5 ?Edema Assessment ?Assessed: [Left: No] [Right: No] ?Edema: [Left: Ye] [Right: s] ?Calf ?Left: Right: ?Point of Measurement: 32 cm From Medial Instep 30 cm ?Ankle ?Left: Right: ?Point of Measurement: 11 cm From Medial Instep 1 cm ?Vascular Assessment ?Pulses: ?Dorsalis Pedis ?Palpable: [Left:Yes] ?Electronic Signature(s) ?Signed: 01/05/2022 3:40:11 PM By: Carlene Coria RN ?Entered By: Carlene Coria on 01/03/2022 10:19:38 ?Jessica [name], Jessica B. ([id number]) ?-------------------------------------------------------------------------------- ?Multi Wound Chart Details ?Patient Name: Jessica [name], Jessica B. ?Date of Service: 01/03/2022 10:15 AM ?Medical Record Number: [id number] ?Patient Account Number: [id number] ?Date of Birth/Sex: [birth year] (81 y.o. F) ?Treating RN: Carlene Coria ?Primary Care Darren Caldron: Early Osmond Other Clinician: ?Referring Ceirra Belli: Early Osmond ?Treating Regene Mccarthy/Extender: Ricard Dillon ?Weeks in Treatment: 5 ?Vital Signs ?Height(in): 60 ?Pulse(bpm): 60 ?Weight(lbs): 100 ?Blood Pressure(mmHg): 138/59 ?Body Mass Index(BMI): 19.5 ?Temperature(??F): 98.3 ?Respiratory Rate(breaths/min): 18 ?Photos: [N/A:N/A] ?Wound Location: Left Lower Leg N/A N/A ?Wounding Event: Trauma N/A N/A ?Primary Etiology: Atypical N/A N/A ?Comorbid History: Hypertension N/A N/A ?Date Acquired: 05/29/2021 N/A N/A ?Weeks of Treatment: 5 N/A N/A ?Wound Status: Open N/A N/A ?Wound Recurrence: No N/A N/A ?Measurements L x W x D (cm) 1.5x1x0.1 N/A N/A ?Area (cm?) : 1.178 N/A N/A ?Volume (cm?) : 0.118 N/A N/A ?% Reduction in Area: 2.60% N/A N/A ?% Reduction in Volume: 2.50% N/A N/A ?Classification: Full Thickness Without Exposed N/A N/A ?Support Structures ?Exudate Amount: Medium N/A N/A ?Exudate Type: Serosanguineous N/A N/A ?Exudate Color: red, brown N/A N/A ?Granulation Amount: Large (67-100%) N/A N/A ?Granulation Quality: Red, Pink N/A N/A ?Necrotic Amount: Small (1-33%) N/A N/A ?Exposed  Structures: ?Fat Layer (Subcutaneous Tissue): N/A N/A ?Yes ?Fascia: No ?Tendon: No ?Muscle: No ?Joint: No ?Bone: No ?Epithelialization: None N/A N/A ?Treatment Notes ?Electronic Signature(s) ?Signed: 01/05/2022 3:40:11 PM By: Carlene Coria

## 2022-01-05 NOTE — Progress Notes (Addendum)
HELI, DINO ([id number]) Visit Report for 01/03/2022 Debridement Details Patient Name: Jessica [name], Jessica B. Date of Service: 01/03/2022 10:15 AM Medical Record Number: [id number] Patient Account Number: [id number] Date of Birth/Sex: [birth year] (81 y.o. F) Treating RN: Carlene Coria Primary Care Provider: Early Osmond Other Clinician: Referring Provider: Early Osmond Treating Provider/Extender: Tito Dine in Treatment: 5 Debridement Performed for Wound #1 Left Lower Leg Assessment: Performed By: Physician Ricard Dillon, MD Debridement Type: Debridement Level of Consciousness (Pre- Awake and Alert procedure): Pre-procedure Verification/Time Out Yes - 10:35 Taken: Start Time: 10:35 Total Area Debrided (L x W): 1.5 (cm) x 1 (cm) = 1.5 (cm) Tissue and other material Viable, Non-Viable, Eschar, Slough, Slough debrided: Level: Non-Viable Tissue Debridement Description: Selective/Open Wound Instrument: Curette Bleeding: Minimum Hemostasis Achieved: Pressure End Time: 10:38 Procedural Pain: 0 Post Procedural Pain: 0 Response to Treatment: Procedure was tolerated well Level of Consciousness (Post- Awake and Alert procedure): Post Debridement Measurements of Total Wound Length: (cm) 1.5 Width: (cm) 1 Depth: (cm) 0.1 Volume: (cm) 0.118 Character of Wound/Ulcer Post Debridement: Improved Post Procedure Diagnosis Same as Pre-procedure Electronic Signature(s) Signed: 01/04/2022 6:42:33 AM By: Linton Ham MD Signed: 01/05/2022 3:40:11 PM By: Carlene Coria RN Entered By: Linton Ham on 01/03/2022 10:54:31 Jessica [name] ([id number]) -------------------------------------------------------------------------------- HPI Details Patient Name: Jessica Balzarine B. Date of Service: 01/03/2022 10:15 AM Medical Record Number: [id number] Patient Account Number: [id number] Date of Birth/Sex: 03-Jun-1941 (81 y.o. F) Treating RN: Carlene Coria Primary Care  Provider: Early Osmond Other Clinician: Referring Provider: Early Osmond Treating Provider/Extender: Tito Dine in Treatment: 5 History of Present Illness HPI Description: 11/25/2021 upon evaluation today patient presents for initial inspection here in our clinic concerning a wound that actually first occurred in October 2022 this was when she got stuck by something when she fell and again this was on the more posterior aspect of her left leg. Subsequently she ended up having a bandage that was on this and when she pulled the bandage off she states it pulled off some skin along the edge. The original wound has closed and unfortunately this area where she pulled off skin has continued to be an issue. Fortunately I do not see any signs of active infection at this time although there is some inflammation and irritation I am concerned about the possibility of pyoderma. There was some eschar/nonviable tissue covering the surface of the wound that was carefully removed with saline and gauze today after the lidocaine had been in place for some time. Nonetheless no sharp debridement was performed per patient request and to be honest I do not think it was absolutely necessary based on how the wound appeared today. The patient does have a history of hypertension but otherwise has no major medical problems she is not significantly swollen no signs of edema whatsoever. 12-06-2021 upon evaluation today patient appears to be doing well with regard to her wound. Actually do feel like that the [facility] with triamcinolone has helped significantly with the inflammation that we were seeing. Things are doing much better in that regard. With that being said I do not see any signs of infection at this time which is great news. Overall I think she may actually benefit from the use of collagen based on what I am seeing. 12-13-2021 upon evaluation today patient appears to be doing well with regard to  her wound. This is measuring a little bit better which is great news and overall I do not see any signs  of active infection locally nor systemically which is great news. 12-20-2021 upon evaluation today patient appears to be doing well currently in regard to her wound. This is showing signs of improvement and week by week we definitely are seeing improvements in the overall size. I feel like that she is on the right track here. There is minimal discomfort she tells me. 12-27-2021 upon evaluation today patient's wound does show some signs of being a little bit more moist than what we would like to see. Fortunately there does not appear to be any evidence of active infection locally or systemically which is great news. I do think we need to try to avoid putting this on as thickly and waiting it down as much I believe there may be too much moisture. She also tells me that she took a shower with the dressing left in place I think it might of gotten wet through the dressing as well which is also not good I did advise her if she takes a shower she needs to make sure to change the dressing. 5/8; left medial leg. Using Prisma as a 2 of it and border foam she is changing this every second day. This was initially traumatic she states from tearing a Band-Aid off of her original wound which was a little bit more posteriorly Electronic Signature(s) Signed: 01/04/2022 6:42:33 AM By: Linton Ham MD Entered By: Linton Ham on 01/03/2022 10:51:12 Jessica [name] ([id number]) -------------------------------------------------------------------------------- Physical Exam Details Patient Name: Jessica Balzarine B. Date of Service: 01/03/2022 10:15 AM Medical Record Number: [id number] Patient Account Number: [id number] Date of Birth/Sex: [birth year] (81 y.o. F) Treating RN: Carlene Coria Primary Care Provider: Early Osmond Other Clinician: Referring Provider: Early Osmond Treating Provider/Extender:  Tito Dine in Treatment: 5 Constitutional Sitting or standing Blood Pressure is within target range for patient.. Pulse regular and within target range for patient.Marland Kitchen Respirations regular, non- labored and within target range.. Temperature is normal and within the target range for the patient.Marland Kitchen appears in no distress. Notes Wound exam; small wound relatively benign looking. Under illumination some degree of a gritty looking surface. I used a #3 curette to gently debride this. There is a rim of epithelialization anteriorly Electronic Signature(s) Signed: 01/04/2022 6:42:33 AM By: Linton Ham MD Entered By: Linton Ham on 01/03/2022 10:52:13 Jessica [name] ([id number]) -------------------------------------------------------------------------------- Physician Orders Details Patient Name: Jessica Balzarine B. Date of Service: 01/03/2022 10:15 AM Medical Record Number: [id number] Patient Account Number: [id number] Date of Birth/Sex: [birth year] (81 y.o. F) Treating RN: Carlene Coria Primary Care Provider: Early Osmond Other Clinician: Referring Provider: Early Osmond Treating Provider/Extender: Tito Dine in Treatment: 5 Verbal / Phone Orders: No Diagnosis Coding Follow-up Appointments o Return Appointment in 1 week. Bathing/ Shower/ Hygiene o May shower; gently cleanse wound with antibacterial soap, rinse and pat dry prior to dressing wounds - keep dressing dry or change after shower o No tub bath. Anesthetic (Use 'Patient Medications' Section for Anesthetic Order Entry) o Lidocaine applied to wound bed Edema Control - Lymphedema / Segmental Compressive Device / Other o Elevate, Exercise Daily and Avoid Standing for Long Periods of Time. o Elevate legs to the level of the heart and pump ankles as often as possible o Elevate leg(s) parallel to the floor when sitting. Additional Orders / Instructions o Follow Nutritious Diet and  Increase Protein Intake Wound Treatment Wound #1 - Lower Leg Wound Laterality: Left Cleanser: Byram Ancillary Kit - 15 Day Supply (Generic) 3 x Per Week/30 Days  Discharge Instructions: Use supplies as instructed; Kit contains: (15) Saline Bullets; (15) 3x3 Gauze; 15 pr Gloves Primary Dressing: Hydrofera Blue Ready Transfer Foam, 2.5x2.5 (in/in) 3 x Per Week/30 Days Discharge Instructions: Apply Hydrofera Blue Ready to wound bed as directed Secondary Dressing: (SILICONE BORDER) Zetuvit Plus SILICONE BORDER Dressing 4x4 (in/in) (DME) (Generic) 3 x Per Week/30 Days Discharge Instructions: Please do not put silicone bordered dressings under wraps. Use non-bordered dressing only. Electronic Signature(s) Signed: 01/04/2022 6:42:33 AM By: Linton Ham MD Signed: 01/05/2022 3:40:11 PM By: Carlene Coria RN Entered By: Carlene Coria on 01/03/2022 10:38:43 Jessica [name] ([id number]) -------------------------------------------------------------------------------- Problem List Details Patient Name: Jessica [name], Jessica B. Date of Service: 01/03/2022 10:15 AM Medical Record Number: [id number] Patient Account Number: [id number] Date of Birth/Sex: November 14, 1940 (81 y.o. F) Treating RN: Carlene Coria Primary Care Provider: Early Osmond Other Clinician: Referring Provider: Early Osmond Treating Provider/Extender: Tito Dine in Treatment: 5 Active Problems ICD-10 Encounter Code Description Active Date MDM Diagnosis L98.8 Other specified disorders of the skin and subcutaneous tissue 11/25/2021 No Yes L97.822 Non-pressure chronic ulcer of other part of left lower leg with fat layer 11/25/2021 No Yes exposed [place] (primary) hypertension 11/25/2021 No Yes Inactive Problems Resolved Problems Electronic Signature(s) Signed: 01/04/2022 6:42:33 AM By: Linton Ham MD Entered By: Linton Ham on 01/03/2022 10:49:44 Jessica [name]  ([id number]) -------------------------------------------------------------------------------- Progress Note Details Patient Name: Jessica Balzarine B. Date of Service: 01/03/2022 10:15 AM Medical Record Number: [id number] Patient Account Number: [id number] Date of Birth/Sex: 08-10-1941 (81 y.o. F) Treating RN: Carlene Coria Primary Care Provider: Early Osmond Other Clinician: Referring Provider: Early Osmond Treating Provider/Extender: Tito Dine in Treatment: 5 Subjective History of Present Illness (HPI) 11/25/2021 upon evaluation today patient presents for initial inspection here in our clinic concerning a wound that actually first occurred in October 2022 this was when she got stuck by something when she fell and again this was on the more posterior aspect of her left leg. Subsequently she ended up having a bandage that was on this and when she pulled the bandage off she states it pulled off some skin along the edge. The original wound has closed and unfortunately this area where she pulled off skin has continued to be an issue. Fortunately I do not see any signs of active infection at this time although there is some inflammation and irritation I am concerned about the possibility of pyoderma. There was some eschar/nonviable tissue covering the surface of the wound that was carefully removed with saline and gauze today after the lidocaine had been in place for some time. Nonetheless no sharp debridement was performed per patient request and to be honest I do not think it was absolutely necessary based on how the wound appeared today. The patient does have a history of hypertension but otherwise has no major medical problems she is not significantly swollen no signs of edema whatsoever. 12-06-2021 upon evaluation today patient appears to be doing well with regard to her wound. Actually do feel like that the [facility] with triamcinolone has helped significantly with the  inflammation that we were seeing. Things are doing much better in that regard. With that being said I do not see any signs of infection at this time which is great news. Overall I think she may actually benefit from the use of collagen based on what I am seeing. 12-13-2021 upon evaluation today patient appears to be doing well with regard to her wound. This is measuring a little bit  better which is great news and overall I do not see any signs of active infection locally nor systemically which is great news. 12-20-2021 upon evaluation today patient appears to be doing well currently in regard to her wound. This is showing signs of improvement and week by week we definitely are seeing improvements in the overall size. I feel like that she is on the right track here. There is minimal discomfort she tells me. 12-27-2021 upon evaluation today patient's wound does show some signs of being a little bit more moist than what we would like to see. Fortunately there does not appear to be any evidence of active infection locally or systemically which is great news. I do think we need to try to avoid putting this on as thickly and waiting it down as much I believe there may be too much moisture. She also tells me that she took a shower with the dressing left in place I think it might of gotten wet through the dressing as well which is also not good I did advise her if she takes a shower she needs to make sure to change the dressing. 5/8; left medial leg. Using Prisma as a 2 of it and border foam she is changing this every second day. This was initially traumatic she states from tearing a Band-Aid off of her original wound which was a little bit more posteriorly Objective Constitutional Sitting or standing Blood Pressure is within target range for patient.. Pulse regular and within target range for patient.Marland Kitchen Respirations regular, non- labored and within target range.. Temperature is normal and within the target  range for the patient.Marland Kitchen appears in no distress. Vitals Time Taken: 10:15 AM, Height: 60 in, Weight: 100 lbs, BMI: 19.5, Temperature: 98.3 F, Pulse: 60 bpm, Respiratory Rate: 18 breaths/min, Blood Pressure: 138/59 mmHg. General Notes: Wound exam; small wound relatively benign looking. Under illumination some degree of a gritty looking surface. I used a #3 curette to gently debride this. There is a rim of epithelialization anteriorly Integumentary (Hair, Skin) Wound #1 status is Open. Original cause of wound was Trauma. The date acquired was: 05/29/2021. The wound has been in treatment 5 weeks. The wound is located on the Left Lower Leg. The wound measures 1.5cm length x 1cm width x 0.1cm depth; 1.178cm^2 area and 0.118cm^3 volume. There is Fat Layer (Subcutaneous Tissue) exposed. There is no tunneling or undermining noted. There is a medium amount of serosanguineous drainage noted. There is large (67-100%) red, pink granulation within the wound bed. There is a small (1-33%) amount of necrotic tissue within the wound bed including Adherent Slough. Jessica [name], Jessica [name] ([id number]) Assessment Active Problems ICD-10 Other specified disorders of the skin and subcutaneous tissue Non-pressure chronic ulcer of other part of left lower leg with fat layer exposed Essential (primary) hypertension Procedures Wound #1 Pre-procedure diagnosis of Wound #1 is an Atypical located on the Left Lower Leg . There was a Selective/Open Wound Non-Viable Tissue Debridement with a total area of 1.5 sq cm performed by Ricard Dillon, MD. With the following instrument(s): Curette to remove Viable and Non-Viable tissue/material. Material removed includes Eschar and Slough and. No specimens were taken. A time out was conducted at 10:35, prior to the start of the procedure. A Minimum amount of bleeding was controlled with Pressure. The procedure was tolerated well with a pain level of 0 throughout and a pain level of 0  following the procedure. Post Debridement Measurements: 1.5cm length x 1cm width x 0.1cm depth;  0.118cm^3 volume. Character of Wound/Ulcer Post Debridement is improved. Post procedure Diagnosis Wound #1: Same as Pre-Procedure Plan Follow-up Appointments: Return Appointment in 1 week. Bathing/ Shower/ Hygiene: May shower; gently cleanse wound with antibacterial soap, rinse and pat dry prior to dressing wounds - keep dressing dry or change after shower No tub bath. Anesthetic (Use 'Patient Medications' Section for Anesthetic Order Entry): Lidocaine applied to wound bed Edema Control - Lymphedema / Segmental Compressive Device / Other: Elevate, Exercise Daily and Avoid Standing for Long Periods of Time. Elevate legs to the level of the heart and pump ankles as often as possible Elevate leg(s) parallel to the floor when sitting. Additional Orders / Instructions: Follow Nutritious Diet and Increase Protein Intake WOUND #1: - Lower Leg Wound Laterality: Left Cleanser: Byram Ancillary Kit - 15 Day Supply (Generic) 3 x Per Week/30 Days Discharge Instructions: Use supplies as instructed; Kit contains: (15) Saline Bullets; (15) 3x3 Gauze; 15 pr Gloves Primary Dressing: Hydrofera Blue Ready Transfer Foam, 2.5x2.5 (in/in) 3 x Per Week/30 Days Discharge Instructions: Apply Hydrofera Blue Ready to wound bed as directed Secondary Dressing: (SILICONE BORDER) Zetuvit Plus SILICONE BORDER Dressing 4x4 (in/in) (DME) (Generic) 3 x Per Week/30 Days Discharge Instructions: Please do not put silicone bordered dressings under wraps. Use non-bordered dressing only. 1. I change the primary dressing to [facility] because at least half of the wound surface has a very fibrinous/sandpaper consistency covered to this. 2. No evidence of infection 3. Very thin rim anteriorly of epithelialization Electronic Signature(s) Signed: 01/14/2022 10:53:30 AM By: Gretta Cool, BSN, RN, CWS, Kim RN, BSN Signed: 01/14/2022 12:10:42  PM By: Linton Ham MD Previous Signature: 01/04/2022 6:42:33 AM Version By: Linton Ham MD Entered By: Gretta Cool, BSN, RN, CWS, Kim on 01/14/2022 10:53:30 Jessica [name] ([id number]) -------------------------------------------------------------------------------- SuperBill Details Patient Name: Jessica [name], Jessica B. Date of Service: 01/03/2022 Medical Record Number: [id number] Patient Account Number: [id number] Date of Birth/Sex: 1941/05/21 (81 y.o. F) Treating RN: Carlene Coria Primary Care Provider: Early Osmond Other Clinician: Referring Provider: Early Osmond Treating Provider/Extender: Tito Dine in Treatment: 5 Diagnosis Coding ICD-10 Codes Code Description L98.8 Other specified disorders of the skin and subcutaneous tissue L97.822 Non-pressure chronic ulcer of other part of left lower leg with fat layer exposed I10 Essential (primary) hypertension Facility Procedures CPT4 Code: [id number] Description: [phone] - DEBRIDE WOUND 1ST 20 SQ CM OR < Modifier: Quantity: 1 CPT4 Code: Description: ICD-10 Diagnosis Description L97.822 Non-pressure chronic ulcer of other part of left lower leg with fat layer Modifier: exposed Quantity: Physician Procedures CPT4 Code: [id number] Description: [id number] - WC PHYS DEBR WO ANESTH 20 SQ CM Modifier: Quantity: 1 CPT4 Code: Description: ICD-10 Diagnosis Description L97.822 Non-pressure chronic ulcer of other part of left lower leg with fat layer Modifier: exposed Quantity: Electronic Signature(s) Signed: 01/04/2022 6:42:33 AM By: Linton Ham MD Entered By: Linton Ham on 01/03/2022 10:55:09

## 2022-01-10 ENCOUNTER — Encounter: Payer: Medicare PPO | Admitting: Physician Assistant

## 2022-01-10 DIAGNOSIS — L97822 Non-pressure chronic ulcer of other part of left lower leg with fat layer exposed: Secondary | ICD-10-CM | POA: Diagnosis not present

## 2022-01-10 DIAGNOSIS — L988 Other specified disorders of the skin and subcutaneous tissue: Secondary | ICD-10-CM | POA: Diagnosis not present

## 2022-01-10 DIAGNOSIS — I10 Essential (primary) hypertension: Secondary | ICD-10-CM | POA: Diagnosis not present

## 2022-01-10 NOTE — Progress Notes (Addendum)
CAPITOLA, LADSON B. ([id number]) ?Visit Report for 01/10/2022 ?Chief Complaint Document Details ?Patient Name: Jessica [name], Jessica B. ?Date of Service: 01/10/2022 10:15 AM ?Medical Record Number: [id number] ?Patient Account Number: [id number] ?Date of Birth/Sex: [birth year] (81 y.o. F) ?Treating RN: Carlene Coria ?Primary Care Provider: Early Osmond Other Clinician: ?Referring Provider: Early Osmond ?Treating Provider/Extender: Jeri Cos ?Weeks in Treatment: 6 ?Information Obtained from: Patient ?Chief Complaint ?Left LE Ulcer ?Electronic Signature(s) ?Signed: 01/10/2022 10:22:14 AM By: Worthy Keeler PA-C ?Entered By: Worthy Keeler on 01/10/2022 10:22:14 ?Jessica [name], Jessica B. ([id number]) ?-------------------------------------------------------------------------------- ?HPI Details ?Patient Name: Jessica [name], Jessica B. ?Date of Service: 01/10/2022 10:15 AM ?Medical Record Number: [id number] ?Patient Account Number: [id number] ?Date of Birth/Sex: [birth year] (81 y.o. F) ?Treating RN: Carlene Coria ?Primary Care Provider: Early Osmond Other Clinician: ?Referring Provider: Early Osmond ?Treating Provider/Extender: Jeri Cos ?Weeks in Treatment: 6 ?History of Present Illness ?HPI Description: 11/25/2021 upon evaluation today patient presents for initial inspection here in our clinic concerning a wound that actually first ?occurred in October 2022 this was when she got stuck by something when she fell and again this was on the more posterior aspect of her left leg. ?Subsequently she ended up having a bandage that was on this and when she pulled the bandage off she states it pulled off some skin along the ?edge. The original wound has closed and unfortunately this area where she pulled off skin has continued to be an issue. Fortunately I do not see ?any signs of active infection at this time although there is some inflammation and irritation I am concerned about the possibility of pyoderma. ?There was some  eschar/nonviable tissue covering the surface of the wound that was carefully removed with saline and gauze today after the ?lidocaine had been in place for some time. Nonetheless no sharp debridement was performed per patient request and to be honest I do not think ?it was absolutely necessary based on how the wound appeared today. ?The patient does have a history of hypertension but otherwise has no major medical problems she is not significantly swollen no signs of edema ?whatsoever. ?12-06-2021 upon evaluation today patient appears to be doing well with regard to her wound. Actually do feel like that the [facility] with ?triamcinolone has helped significantly with the inflammation that we were seeing. Things are doing much better in that regard. With that being ?said I do not see any signs of infection at this time which is great news. Overall I think she may actually benefit from the use of collagen based on ?what I am seeing. ?12-13-2021 upon evaluation today patient appears to be doing well with regard to her wound. This is measuring a little bit better which is great ?news and overall I do not see any signs of active infection locally nor systemically which is great news. ?12-20-2021 upon evaluation today patient appears to be doing well currently in regard to her wound. This is showing signs of improvement and ?week by week we definitely are seeing improvements in the overall size. I feel like that she is on the right track here. There is minimal discomfort ?she tells me. ?12-27-2021 upon evaluation today patient's wound does show some signs of being a little bit more moist than what we would like to see. ?Fortunately there does not appear to be any evidence of active infection locally or systemically which is great news. I do think we need to try to ?avoid putting this on as thickly and waiting it down as much I believe  there may be too much moisture. She also tells me that she took a shower ?with the  dressing left in place I think it might of gotten wet through the dressing as well which is also not good I did advise her if she takes a ?shower she needs to make sure to change the dressing. ?5/8; left medial leg. Using Prisma as a 2 of it and border foam she is changing this every second day. This was initially traumatic she states from ?tearing a Band-Aid off of her original wound which was a little bit more posteriorly ?01-10-2022 upon evaluation today patient appears to be doing well with regard to her wound. Fortunately there does not appear to be any ?evidence of active infection locally or systemically which is great news and overall I am extremely pleased with where we stand today. The biggest ?issue she has is she really does not like the green color of the bandages were using she hopes that we can try something a little different. ?Electronic Signature(s) ?Signed: 01/10/2022 10:49:54 AM By: Worthy Keeler PA-C ?Entered By: Worthy Keeler on 01/10/2022 10:49:54 ?Jessica [name], Jessica B. ([id number]) ?-------------------------------------------------------------------------------- ?Physical Exam Details ?Patient Name: Jessica [name], Jessica B. ?Date of Service: 01/10/2022 10:15 AM ?Medical Record Number: [id number] ?Patient Account Number: [id number] ?Date of Birth/Sex: [birth year] (81 y.o. F) ?Treating RN: Carlene Coria ?Primary Care Provider: Early Osmond Other Clinician: ?Referring Provider: Early Osmond ?Treating Provider/Extender: Jeri Cos ?Weeks in Treatment: 6 ?Constitutional ?Well-nourished and well-hydrated in no acute distress. ?Respiratory ?normal breathing without difficulty. ?Psychiatric ?this patient is able to make decisions and demonstrates good insight into disease process. Alert and Oriented x 3. pleasant and cooperative. ?Notes ?Patient's wound did not require any debridement today it actually appears to be doing awesome and overall I am extremely pleased with where ?we stand. ?Electronic  Signature(s) ?Signed: 01/10/2022 10:50:04 AM By: Worthy Keeler PA-C ?Entered By: Worthy Keeler on 01/10/2022 10:50:04 ?Jessica [name], Jessica B. ([id number]) ?-------------------------------------------------------------------------------- ?Physician Orders Details ?Patient Name: Jessica [name], Jessica B. ?Date of Service: 01/10/2022 10:15 AM ?Medical Record Number: [id number] ?Patient Account Number: [id number] ?Date of Birth/Sex: 1941-03-30 (81 y.o. 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 ?Bathing/ Shower/ Hygiene ?o May shower; gently cleanse wound with antibacterial soap, rinse and pat dry prior to dressing wounds - keep dressing dry or ?change after shower ?o No tub bath. ?Anesthetic (Use 'Patient Medications' Section for Anesthetic Order Entry) ?o Lidocaine applied to wound bed ?Edema Control - Lymphedema / Segmental Compressive Device / Other ?o Elevate, Exercise Daily and Avoid Standing for Long Periods of Time. ?o Elevate legs to the level of the heart and pump ankles as often as possible ?o Elevate leg(s) parallel to the floor when sitting. ?Additional Orders / Instructions ?o Follow Nutritious Diet and Increase Protein Intake ?Wound Treatment ?Wound #1 - Lower Leg Wound Laterality: Left ?Cleanser: Byram Ancillary Kit - 15 Day Supply (Generic) 3 x Per Week/30 Days ?Discharge Instructions: Use supplies as instructed; Kit contains: (15) Saline Bullets; (15) 3x3 Gauze; 15 pr Gloves ?Primary Dressing: Hydrofera Blue Ready Transfer  Foam, 2.5x2.5 (in/in) 3 x Per Week/30 Days ?Discharge  Instructions: Apply Hydrofera Blue Ready to wound bed as directed ?Secondary Dressing: (SILICONE BORDER) Zetuvit Plus SILICONE BORDER Dressing 4x4 (in/in) (Generic) 3 x Per We

## 2022-01-11 NOTE — Progress Notes (Signed)
Jessica [name], Jessica B. ([id number]) ?Visit Report for 01/10/2022 ?Arrival Information Details ?Patient Name: Jessica [name], Jessica B. ?Date of Service: 01/10/2022 10:15 AM ?Medical Record Number: [id number] ?Patient Account Number: [id number] ?Date of Birth/Sex: Aug 19, 1941 (81 y.o. F) ?Treating RN: Carlene Coria ?Primary Care Rylee Nuzum: Early Osmond Other Clinician: ?Referring Gabryel Files: Early Osmond ?Treating Syble Picco/Extender: Jeri Cos ?Weeks in Treatment: 6 ?Visit Information History Since Last Visit ?All ordered tests and consults were completed: No ?Patient Arrived: Ambulatory ?Added or deleted any medications: No ?Arrival Time: 10:09 ?Any new allergies or adverse reactions: No ?Accompanied By: self ?Had a fall or experienced change in No ?Transfer Assistance: None ?activities of daily living that may affect ?Patient Identification Verified: Yes ?risk of falls: ?Secondary Verification Process Completed: Yes ?Signs or symptoms of abuse/neglect since last visito No ?Patient Requires Transmission-Based No ?Hospitalized since last visit: No ?Precautions: ?Implantable device outside of the clinic excluding No ?Patient Has Alerts: Yes ?cellular tissue based products placed in the center ?Patient Alerts: ABI L1.1 TBI .72 3-20- ?since last visit: ?23 ?Has Dressing in Place as Prescribed: Yes ?ABI R 1.4 TBI .72 3-20- ?Pain Present Now: No ?23 ?Electronic Signature(s) ?Signed: 01/11/2022 10:35:39 AM By: Carlene Coria RN ?Entered By: Carlene Coria on 01/10/2022 10:13:58 ?Jessica [name], Jessica B. ([id number]) ?-------------------------------------------------------------------------------- ?Clinic Level of Care Assessment Details ?Patient Name: SIANNE, TEJADA B. ?Date of Service: 01/10/2022 10:15 AM ?Medical Record Number: [id number] ?Patient Account Number: [id number] ?Date of Birth/Sex: November 26, 1940 (81 y.o. F) ?Treating RN: Carlene Coria ?Primary Care Khamia Stambaugh: Early Osmond Other Clinician: ?Referring Derek Huneycutt: Early Osmond ?Treating Salem Mastrogiovanni/Extender: Jeri Cos ?Weeks in Treatment: 6 ?Clinic Level of Care Assessment Items ?TOOL 4 Quantity Score ?X - Use when only an EandM is performed on FOLLOW-UP visit 1 0 ?ASSESSMENTS - Nursing Assessment / Reassessment ?X - Reassessment of Co-morbidities (includes updates in patient status) 1 10 ?X- 1 5 ?Reassessment of Adherence to Treatment Plan ?ASSESSMENTS - Wound and Skin Assessment / Reassessment ?X - Simple Wound Assessment / Reassessment - one wound 1 5 ?'[]'$  - 0 ?Complex Wound Assessment / Reassessment - multiple wounds ?'[]'$  - 0 ?Dermatologic / Skin Assessment (not related to wound area) ?ASSESSMENTS - Focused Assessment ?'[]'$  - Circumferential Edema Measurements - multi extremities 0 ?'[]'$  - 0 ?Nutritional Assessment / Counseling / Intervention ?'[]'$  - 0 ?Lower Extremity Assessment (monofilament, tuning fork, pulses) ?'[]'$  - 0 ?Peripheral Arterial Disease Assessment (using hand held doppler) ?ASSESSMENTS - Ostomy and/or Continence Assessment and Care ?'[]'$  - Incontinence Assessment and Management 0 ?'[]'$  - 0 ?Ostomy Care Assessment and Management (repouching, etc.) ?PROCESS - Coordination of Care ?X - Simple Patient / Family Education for ongoing care 1 15 ?'[]'$  - 0 ?Complex (extensive) Patient / Family Education for ongoing care ?'[]'$  - 0 ?Staff obtains Consents, Records, Test Results / Process Orders ?'[]'$  - 0 ?Staff telephones HHA, Nursing Homes / Clarify orders / etc ?'[]'$  - 0 ?Routine Transfer to another Facility (non-emergent condition) ?'[]'$  - 0 ?Routine Hospital Admission (non-emergent condition) ?'[]'$  - 0 ?New Admissions / Biomedical engineer / Ordering NPWT, Apligraf, etc. ?'[]'$  - 0 ?Emergency Hospital Admission (emergent condition) ?X- 1 10 ?Simple Discharge Coordination ?'[]'$  - 0 ?Complex (extensive) Discharge Coordination ?PROCESS - Special Needs ?'[]'$  - Pediatric / Minor Patient Management 0 ?'[]'$  - 0 ?Isolation Patient Management ?'[]'$  - 0 ?Hearing / Language / Visual special needs ?'[]'$  -  0 ?Assessment of Community assistance (transportation, D/C planning, etc.) ?'[]'$  - 0 ?Additional assistance / Altered mentation ?'[]'$  - 0 ?Support Surface(s) Assessment (bed, cushion, seat, etc.) ?INTERVENTIONS -  Wound Cleansing / Measurement ?Jessica [name], Jessica B. ([id number]) ?X- 1 5 ?Simple Wound Cleansing - one wound ?'[]'$  - 0 ?Complex Wound Cleansing - multiple wounds ?X- 1 5 ?Wound Imaging (photographs - any number of wounds) ?'[]'$  - 0 ?Wound Tracing (instead of photographs) ?X- 1 5 ?Simple Wound Measurement - one wound ?'[]'$  - 0 ?Complex Wound Measurement - multiple wounds ?INTERVENTIONS - Wound Dressings ?X - Small Wound Dressing one or multiple wounds 1 10 ?'[]'$  - 0 ?Medium Wound Dressing one or multiple wounds ?'[]'$  - 0 ?Large Wound Dressing one or multiple wounds ?'[]'$  - 0 ?Application of Medications - topical ?'[]'$  - 0 ?Application of Medications - injection ?INTERVENTIONS - Miscellaneous ?'[]'$  - External ear exam 0 ?'[]'$  - 0 ?Specimen Collection (cultures, biopsies, blood, body fluids, etc.) ?'[]'$  - 0 ?Specimen(s) / Culture(s) sent or taken to Lab for analysis ?'[]'$  - 0 ?Patient Transfer (multiple staff / Civil Service fast streamer / Similar devices) ?'[]'$  - 0 ?Simple Staple / Suture removal (25 or less) ?'[]'$  - 0 ?Complex Staple / Suture removal (26 or more) ?'[]'$  - 0 ?Hypo / Hyperglycemic Management (close monitor of Blood Glucose) ?'[]'$  - 0 ?Ankle / Brachial Index (ABI) - do not check if billed separately ?X- 1 5 ?Vital Signs ?Has the patient been seen at the hospital within the last three years: Yes ?Total Score: 75 ?Level Of Care: New/Established - Level ?2 ?Electronic Signature(s) ?Signed: 01/11/2022 10:35:39 AM By: Carlene Coria RN ?Entered By: Carlene Coria on 01/10/2022 10:43:06 ?Jessica [name], Jessica B. ([id number]) ?-------------------------------------------------------------------------------- ?Encounter Discharge Information Details ?Patient Name: Jessica [name], Jessica B. ?Date of Service: 01/10/2022 10:15 AM ?Medical Record Number:  [id number] ?Patient Account Number: [id number] ?Date of Birth/Sex: 1941-05-10 (81 y.o. F) ?Treating RN: Carlene Coria ?Primary Care Tivis Wherry: Early Osmond Other Clinician: ?Referring Calyssa Zobrist: Early Osmond ?Treating Kayston Jodoin/Extender: Jeri Cos ?Weeks in Treatment: 6 ?Encounter Discharge Information Items ?Discharge Condition: Stable ?Ambulatory Status: Ambulatory ?Discharge Destination: Home ?Transportation: Private Auto ?Accompanied By: self ?Schedule Follow-up Appointment: Yes ?Clinical Summary of Care: Patient Declined ?Electronic Signature(s) ?Signed: 01/11/2022 10:35:39 AM By: Carlene Coria RN ?Entered By: Carlene Coria on 01/10/2022 10:54:36 ?Jessica [name], Jessica B. ([id number]) ?-------------------------------------------------------------------------------- ?Lower Extremity Assessment Details ?Patient Name: Jessica [name], Jessica B. ?Date of Service: 01/10/2022 10:15 AM ?Medical Record Number: [id number] ?Patient Account Number: [id number] ?Date of Birth/Sex: 04-03-1941 (81 y.o. F) ?Treating RN: Carlene Coria ?Primary Care Nitya Cauthon: Early Osmond Other Clinician: ?Referring Ojani Berenson: Early Osmond ?Treating Golden Gilreath/Extender: Jeri Cos ?Weeks in Treatment: 6 ?Edema Assessment ?Assessed: [Left: No] [Right: No] ?Edema: [Left: Ye] [Right: s] ?Calf ?Left: Right: ?Point of Measurement: 32 cm From Medial Instep 30 cm ?Ankle ?Left: Right: ?Point of Measurement: 11 cm From Medial Instep 18 cm ?Electronic Signature(s) ?Signed: 01/11/2022 10:35:39 AM By: Carlene Coria RN ?Entered By: Carlene Coria on 01/10/2022 10:20:22 ?Jessica [name], Jessica B. ([id number]) ?-------------------------------------------------------------------------------- ?Multi Wound Chart Details ?Patient Name: Jessica [name], Jessica B. ?Date of Service: 01/10/2022 10:15 AM ?Medical Record Number: [id number] ?Patient Account Number: [id number] ?Date of Birth/Sex: 1941-04-02 (81 y.o. F) ?Treating RN: Carlene Coria ?Primary Care Deshanta Lady: Early Osmond Other  Clinician: ?Referring Leidy Massar: Early Osmond ?Treating Murle Hellstrom/Extender: Jeri Cos ?Weeks in Treatment: 6 ?Vital Signs ?Height(in): 60 ?Pulse(bpm): 61 ?Weight(lbs): 100 ?Blood Pressure(mmHg): 125/73 ?Body Mass Index(BMI): 19.5 ?Temperature(??F): 98.

## 2022-01-17 ENCOUNTER — Encounter: Payer: Medicare PPO | Admitting: Physician Assistant

## 2022-01-17 DIAGNOSIS — L97822 Non-pressure chronic ulcer of other part of left lower leg with fat layer exposed: Secondary | ICD-10-CM | POA: Diagnosis not present

## 2022-01-17 DIAGNOSIS — I10 Essential (primary) hypertension: Secondary | ICD-10-CM | POA: Diagnosis not present

## 2022-01-17 DIAGNOSIS — L988 Other specified disorders of the skin and subcutaneous tissue: Secondary | ICD-10-CM | POA: Diagnosis not present

## 2022-01-17 NOTE — Progress Notes (Signed)
SYD, MANGES ([id number]) Visit Report for 01/17/2022 Arrival Information Details Patient Name: Jessica [name], Jessica [name]. Date of Service: 01/17/2022 10:15 AM Medical Record Number: [id number] Patient Account Number: [id number] Date of Birth/Sex: [birth year] (81 y.o. F) Treating RN: Carlene Coria Primary Care Taray Normoyle: Early Osmond Other Clinician: Referring Dameian Crisman: Early Osmond Treating Burdette Gergely/Extender: Skipper Cliche in Treatment: 7 Visit Information History Since Last Visit All ordered tests and consults were completed: No Patient Arrived: Ambulatory Added or deleted any medications: No Arrival Time: 10:18 Any new allergies or adverse reactions: No Accompanied By: self Had a fall or experienced change in No Transfer Assistance: None activities of daily living that may affect Patient Identification Verified: Yes risk of falls: Secondary Verification Process Completed: Yes Signs or symptoms of abuse/neglect since last visito No Patient Requires Transmission-Based No Hospitalized since last visit: No Precautions: Implantable device outside of the clinic excluding No Patient Has Alerts: Yes cellular tissue based products placed in the center Patient Alerts: ABI L1.1 TBI .72 3-20- since last visit: 23 Has Dressing in Place as Prescribed: Yes ABI R 1.4 TBI .72 3-20- Pain Present Now: No 23 Electronic Signature(s) Signed: 01/17/2022 4:02:04 PM By: Carlene Coria RN Entered By: Carlene Coria on 01/17/2022 10:22:42 Jessica [name] ([id number]) -------------------------------------------------------------------------------- Clinic Level of Care Assessment Details Patient Name: Jessica Balzarine B. Date of Service: 01/17/2022 10:15 AM Medical Record Number: [id number] Patient Account Number: [id number] Date of Birth/Sex: 04-22-1941 (81 y.o. F) Treating RN: Carlene Coria Primary Care Jailah Willis: Early Osmond Other Clinician: Referring Daizy Outen: Early Osmond Treating Donabelle Molden/Extender: Skipper Cliche in Treatment: 7 Clinic Level of Care Assessment Items TOOL 4 Quantity Score X - Use when only an EandM is performed on FOLLOW-UP visit 1 0 ASSESSMENTS - Nursing Assessment / Reassessment X - Reassessment of Co-morbidities (includes updates in patient status) 1 10 X- 1 5 Reassessment of Adherence to Treatment Plan ASSESSMENTS - Wound and Skin Assessment / Reassessment X - Simple Wound Assessment / Reassessment - one wound 1 5 _0  - 0 Complex Wound Assessment / Reassessment - multiple wounds _1  - 0 Dermatologic / Skin Assessment (not related to wound area) ASSESSMENTS - Focused Assessment _2  - Circumferential Edema Measurements - multi extremities 0 _3  - 0 Nutritional Assessment / Counseling / Intervention _4  - 0 Lower Extremity Assessment (monofilament, tuning fork, pulses) _5  - 0 Peripheral Arterial Disease Assessment (using hand held doppler) ASSESSMENTS - Ostomy and/or Continence Assessment and Care _6  - Incontinence Assessment and Management 0 _7  - 0 Ostomy Care Assessment and Management (repouching, etc.) PROCESS - Coordination of Care X - Simple Patient / Family Education for ongoing care 1 15 _8  - 0 Complex (extensive) Patient / Family Education for ongoing care _9  - 0 Staff obtains Programmer, systems, Records, Test Results / Process Orders _10  - 0 Staff telephones HHA, Nursing Homes / Clarify orders / etc _11  - 0 Routine Transfer to another Facility (non-emergent condition) _12  - 0 Routine Hospital Admission (non-emergent condition) _13  - 0 New Admissions / Biomedical engineer / Ordering NPWT, Apligraf, etc. _14  - 0 Emergency Hospital Admission (emergent condition) X- 1 10 Simple Discharge Coordination _15  - 0 Complex (extensive) Discharge Coordination PROCESS - Special Needs _16  - Pediatric / Minor Patient Management 0 _17  - 0 Isolation Patient Management _18  - 0 Hearing / Language / Visual special needs _19  -  0 Assessment of Community assistance (transportation, D/C planning, etc.) _20  - 0 Additional assistance / Altered mentation _21  - 0 Support Surface(s) Assessment (bed, cushion, seat, etc.) INTERVENTIONS -  Wound Cleansing / Measurement Jessica [name], Jessica B. ([id number]) X- 1 5 Simple Wound Cleansing - one wound _0  - 0 Complex Wound Cleansing - multiple wounds X- 1 5 Wound Imaging (photographs - any number of wounds) _1  - 0 Wound Tracing (instead of photographs) X- 1 5 Simple Wound Measurement - one wound _2  - 0 Complex Wound Measurement - multiple wounds INTERVENTIONS - Wound Dressings X - Small Wound Dressing one or multiple wounds 1 10 _3  - 0 Medium Wound Dressing one or multiple wounds _4  - 0 Large Wound Dressing one or multiple wounds <JSHFWYOVZCHYIFOY>_7<\/XAJOINOMVEHMCNOB>_0  - 0 Application of Medications - topical <JGGEZMOQHUTMLYYT>_0<\/PTWSFKCLEXNTZGYF>_7  - 0 Application of Medications - injection INTERVENTIONS - Miscellaneous _7  - External ear exam 0 _8  - 0 Specimen Collection (cultures, biopsies, blood, body fluids, etc.) _9  - 0 Specimen(s) / Culture(s) sent or taken to Lab for analysis _10  - 0 Patient Transfer (multiple staff / Civil Service fast streamer / Similar devices) _11  - 0 Simple Staple / Suture removal (25 or less) _12  - 0 Complex Staple / Suture removal (26 or more) _13  - 0 Hypo / Hyperglycemic Management (close monitor of Blood Glucose) _14  - 0 Ankle / Brachial Index (ABI) - do not check if billed separately X- 1 5 Vital Signs Has the patient been seen at the hospital within the last three years: Yes Total Score: 75 Level Of Care: New/Established - Level 2 Electronic Signature(s) Signed: 01/17/2022 4:02:04 PM By: Carlene Coria RN Entered By: Carlene Coria on 01/17/2022 10:43:43 Jessica [name] ([id number]) -------------------------------------------------------------------------------- Encounter Discharge Information Details Patient Name: Jessica Balzarine B. Date of Service: 01/17/2022 10:15 AM Medical Record Number: [id number] Patient  Account Number: [id number] Date of Birth/Sex: 1941/06/26 (81 y.o. F) Treating RN: Carlene Coria Primary Care Nakoa Ganus: Early Osmond Other Clinician: Referring Lennan Malone: Early Osmond Treating Tressie Ragin/Extender: Skipper Cliche in Treatment: 7 Encounter Discharge Information Items Discharge Condition: Stable Ambulatory Status: Ambulatory Discharge Destination: Home Transportation: Private Auto Accompanied By: self Schedule Follow-up Appointment: Yes Clinical Summary of Care: Patient Declined Electronic Signature(s) Signed: 01/17/2022 4:02:04 PM By: Carlene Coria RN Entered By: Carlene Coria on 01/17/2022 10:44:36 Jessica [name] ([id number]) -------------------------------------------------------------------------------- Lower Extremity Assessment Details Patient Name: Jessica Balzarine B. Date of Service: 01/17/2022 10:15 AM Medical Record Number: [id number] Patient Account Number: [id number] Date of Birth/Sex: 08-18-1941 (81 y.o. F) Treating RN: Carlene Coria Primary Care Jessie Schrieber: Early Osmond Other Clinician: Referring Aven Cegielski: Early Osmond Treating Alexee Delsanto/Extender: Skipper Cliche in Treatment: 7 Edema Assessment Assessed: [Left: No] [Right: No] Edema: [Left: Ye] [Right: s] Calf Left: Right: Point of Measurement: 32 cm From Medial Instep 29 cm Ankle Left: Right: Point of Measurement: 11 cm From Medial Instep 16 cm Vascular Assessment Pulses: Dorsalis Pedis Palpable: [Left:Yes] Electronic Signature(s) Signed: 01/17/2022 4:02:04 PM By: Carlene Coria RN Entered By: Carlene Coria on 01/17/2022 10:30:06 Jessica [name] ([id number]) -------------------------------------------------------------------------------- Multi Wound Chart Details Patient Name: Jessica Balzarine B. Date of Service: 01/17/2022 10:15 AM Medical Record Number: [id number] Patient Account Number: [id number] Date of Birth/Sex: [birth year] (81 y.o. F) Treating RN: Carlene Coria Primary Care  Yoshito Gaza: Early Osmond Other Clinician: Referring Chenae Brager: Early Osmond Treating Deija Buhrman/Extender: Skipper Cliche in Treatment: 7 Vital Signs Height(in): 60 Pulse(bpm): 93 Weight(lbs): 100 Blood Pressure(mmHg): 102/75 Body Mass Index(BMI): 19.5 Temperature(F): 97.8 Respiratory Rate(breaths/min): 18 Photos: [N/A:N/A] Wound Location: Left Lower Leg N/A N/A Wounding Event: Trauma N/A N/A Primary Etiology: Atypical N/A N/A Comorbid History: Hypertension N/A N/A Date Acquired: 05/29/2021 N/A N/A Weeks of Treatment: 7 N/A N/A Wound Status: Open N/A N/A Wound  Recurrence: No N/A N/A Measurements L x W x D (cm) 1x0.7x0.1 N/A N/A Area (cm) : 0.55 N/A N/A Volume (cm) : 0.055 N/A N/A % Reduction in Area: 54.50% N/A N/A % Reduction in Volume: 54.50% N/A N/A Classification: Full Thickness Without Exposed N/A N/A Support Structures Exudate Amount: Medium N/A N/A Exudate Type: Serosanguineous N/A N/A Exudate Color: red, brown N/A N/A Granulation Amount: Large (67-100%) N/A N/A Granulation Quality: Red, Pink N/A N/A Necrotic Amount: Small (1-33%) N/A N/A Exposed Structures: Fat Layer (Subcutaneous Tissue): N/A N/A Yes Fascia: No Tendon: No Muscle: No Joint: No Bone: No Epithelialization: None N/A N/A Treatment Notes Electronic Signature(s) Signed: 01/17/2022 4:02:04 PM By: Carlene Coria RN Entered By: Carlene Coria on 01/17/2022 10:30:16 Jessica [name] ([id number]) -------------------------------------------------------------------------------- [place] Details Patient Name: Jessica Balzarine B. Date of Service: 01/17/2022 10:15 AM Medical Record Number: [id number] Patient Account Number: [id number] Date of Birth/Sex: [birth year] (80 y.o. F) Treating RN: Carlene Coria Primary Care Giavonna Pflum: Early Osmond Other Clinician: Referring Haydn Cush: Early Osmond Treating Ryah Cribb/Extender: Skipper Cliche in Treatment: 7 Active Inactive Wound/Skin  Impairment Nursing Diagnoses: Knowledge deficit related to ulceration/compromised skin integrity Goals: Patient/caregiver will verbalize understanding of skin care regimen Date Initiated: 11/25/2021 Target Resolution Date: 01/25/2022 Goal Status: Active Ulcer/skin breakdown will have a volume reduction of 30% by week 4 Date Initiated: 11/25/2021 Date Inactivated: 01/10/2022 Target Resolution Date: 12/26/2021 Goal Status: Unmet Unmet Reason: comobities Ulcer/skin breakdown will have a volume reduction of 50% by week 8 Date Initiated: 11/25/2021 Target Resolution Date: 01/25/2022 Goal Status: Active Ulcer/skin breakdown will have a volume reduction of 80% by week 12 Date Initiated: 11/25/2021 Target Resolution Date: 02/25/2022 Goal Status: Active Ulcer/skin breakdown will heal within 14 weeks Date Initiated: 11/25/2021 Target Resolution Date: 03/27/2022 Goal Status: Active Interventions: Assess patient/caregiver ability to obtain necessary supplies Assess patient/caregiver ability to perform ulcer/skin care regimen upon admission and as needed Assess ulceration(s) every visit Notes: Electronic Signature(s) Signed: 01/17/2022 4:02:04 PM By: Carlene Coria RN Entered By: Carlene Coria on 01/17/2022 10:30:10 Jessica [name] ([id number]) -------------------------------------------------------------------------------- Pain Assessment Details Patient Name: Jessica Balzarine B. Date of Service: 01/17/2022 10:15 AM Medical Record Number: [id number] Patient Account Number: [id number] Date of Birth/Sex: 07-01-1941 (81 y.o. F) Treating RN: Carlene Coria Primary Care Sol Englert: Early Osmond Other Clinician: Referring Efstathios Sawin: Early Osmond Treating Lavender Stanke/Extender: Skipper Cliche in Treatment: 7 Active Problems Location of Pain Severity and Description of Pain Patient Has Paino No Site Locations Pain Management and Medication Current Pain Management: Electronic Signature(s) Signed:  01/17/2022 4:02:04 PM By: Carlene Coria RN Entered By: Carlene Coria on 01/17/2022 10:24:08 Jessica [name] ([id number]) -------------------------------------------------------------------------------- Patient/Caregiver Education Details Patient Name: Jessica Balzarine B. Date of Service: 01/17/2022 10:15 AM Medical Record Number: [id number] Patient Account Number: [id number] Date of Birth/Gender: Jan 18, 1941 (81 y.o. F) Treating RN: Carlene Coria Primary Care Physician: Early Osmond Other Clinician: Referring Physician: Early Osmond Treating Physician/Extender: Skipper Cliche in Treatment: 7 Education Assessment Education Provided To: Patient Education Topics Provided Wound/Skin Impairment: Methods: Explain/Verbal Responses: State content correctly Electronic Signature(s) Signed: 01/17/2022 4:02:04 PM By: Carlene Coria RN Entered By: Carlene Coria on 01/17/2022 10:43:57 Jessica [name] ([id number]) -------------------------------------------------------------------------------- Wound Assessment Details Patient Name: Jessica Balzarine B. Date of Service: 01/17/2022 10:15 AM Medical Record Number: [id number] Patient Account Number: [id number] Date of Birth/Sex: [birth year] (81 y.o. F) Treating RN: Carlene Coria Primary Care Tushar Enns: Early Osmond Other Clinician: Referring Nyaira Hodgens: Early Osmond Treating Vane Yapp/Extender: Skipper Cliche in Treatment: 7 Wound Status Wound Number: 1 Primary  Etiology: Atypical Wound Location: Left Lower Leg Wound Status: Open Wounding Event: Trauma Comorbid History: Hypertension Date Acquired: 05/29/2021 Weeks Of Treatment: 7 Clustered Wound: No Photos Wound Measurements Length: (cm) 1 Width: (cm) 0.7 Depth: (cm) 0.1 Area: (cm) 0.55 Volume: (cm) 0.055 % Reduction in Area: 54.5% % Reduction in Volume: 54.5% Epithelialization: None Wound Description Classification: Full Thickness Without Exposed Support  Structures Exudate Amount: Medium Exudate Type: Serosanguineous Exudate Color: red, brown Foul Odor After Cleansing: No Slough/Fibrino Yes Wound Bed Granulation Amount: Large (67-100%) Exposed Structure Granulation Quality: Red, Pink Fascia Exposed: No Necrotic Amount: Small (1-33%) Fat Layer (Subcutaneous Tissue) Exposed: Yes Necrotic Quality: Adherent Slough Tendon Exposed: No Muscle Exposed: No Joint Exposed: No Bone Exposed: No Treatment Notes Wound #1 (Lower Leg) Wound Laterality: Left Cleanser [place] Ancillary Kit - 15 Day Supply Discharge Instruction: Use supplies as instructed; Kit contains: (15) Saline Bullets; (15) 3x3 Gauze; 15 pr Gloves Peri-Wound Care RAIA, AMICO B. ([id number]) Topical Primary Dressing Hydrofera Blue Ready Transfer Foam, 2.5x2.5 (in/in) Discharge Instruction: Apply Hydrofera Blue Ready to wound bed as directed Secondary Dressing (SILICONE BORDER) Zetuvit Plus SILICONE BORDER Dressing 4x4 (in/in) Discharge Instruction: Please do not put silicone bordered dressings under wraps. Use non-bordered dressing only. Secured With Compression Wrap Compression Stockings Environmental education officer) Signed: 01/17/2022 4:02:04 PM By: Carlene Coria RN Entered By: Carlene Coria on 01/17/2022 10:28:49 Jessica [name] ([id number]) -------------------------------------------------------------------------------- Vitals Details Patient Name: Jessica Balzarine B. Date of Service: 01/17/2022 10:15 AM Medical Record Number: [id number] Patient Account Number: [id number] Date of Birth/Sex: 1940/11/25 (81 y.o. F) Treating RN: Carlene Coria Primary Care Isma Tietje: Early Osmond Other Clinician: Referring Jhace Fennell: Early Osmond Treating Milton Sagona/Extender: Skipper Cliche in Treatment: 7 Vital Signs Time Taken: 10:22 Temperature (F): 97.8 Height (in): 60 Pulse (bpm): 93 Weight (lbs): 100 Respiratory Rate (breaths/min): 18 Body Mass Index (BMI):  19.5 Blood Pressure (mmHg): 102/75 Reference Range: 80 - 120 mg / dl Electronic Signature(s) Signed: 01/17/2022 4:02:04 PM By: Carlene Coria RN Entered By: Carlene Coria on 01/17/2022 10:24:02

## 2022-01-17 NOTE — Progress Notes (Addendum)
LATARSHA, ZANI ([id number]) Visit Report for 01/17/2022 Chief Complaint Document Details Patient Name: Jessica [name], Jessica [name]. Date of Service: 01/17/2022 10:15 AM Medical Record Number: [id number] Patient Account Number: [id number] Date of Birth/Sex: July 19, 1941 (81 y.o. F) Treating RN: Carlene Coria Primary Care Provider: Early Osmond Other Clinician: Referring Provider: Early Osmond Treating Provider/Extender: Skipper Cliche in Treatment: 7 Information Obtained from: Patient Chief Complaint Left LE Ulcer Electronic Signature(s) Signed: 01/17/2022 10:29:49 AM By: Worthy Keeler PA-C Entered By: Worthy Keeler on 01/17/2022 10:29:49 Jessica [name] ([id number]) -------------------------------------------------------------------------------- HPI Details Patient Name: Jessica Balzarine [name]. Date of Service: 01/17/2022 10:15 AM Medical Record Number: [id number] Patient Account Number: [id number] Date of Birth/Sex: 03/04/1941 (81 y.o. F) Treating RN: Carlene Coria Primary Care Provider: Early Osmond Other Clinician: Referring Provider: Early Osmond Treating Provider/Extender: Skipper Cliche in Treatment: 7 History of Present Illness HPI Description: 11/25/2021 upon evaluation today patient presents for initial inspection here in our clinic concerning a wound that actually first occurred in October 2022 this was when she got stuck by something when she fell and again this was on the more posterior aspect of her left leg. Subsequently she ended up having a bandage that was on this and when she pulled the bandage off she states it pulled off some skin along the edge. The original wound has closed and unfortunately this area where she pulled off skin has continued to be an issue. Fortunately I do not see any signs of active infection at this time although there is some inflammation and irritation I am concerned about the possibility of pyoderma. There was some  eschar/nonviable tissue covering the surface of the wound that was carefully removed with saline and gauze today after the lidocaine had been in place for some time. Nonetheless no sharp debridement was performed per patient request and to be honest I do not think it was absolutely necessary based on how the wound appeared today. The patient does have a history of hypertension but otherwise has no major medical problems she is not significantly swollen no signs of edema whatsoever. 12-06-2021 upon evaluation today patient appears to be doing well with regard to her wound. Actually do feel like that the [facility] with triamcinolone has helped significantly with the inflammation that we were seeing. Things are doing much better in that regard. With that being said I do not see any signs of infection at this time which is great news. Overall I think she may actually benefit from the use of collagen based on what I am seeing. 12-13-2021 upon evaluation today patient appears to be doing well with regard to her wound. This is measuring a little bit better which is great news and overall I do not see any signs of active infection locally nor systemically which is great news. 12-20-2021 upon evaluation today patient appears to be doing well currently in regard to her wound. This is showing signs of improvement and week by week we definitely are seeing improvements in the overall size. I feel like that she is on the right track here. There is minimal discomfort she tells me. 12-27-2021 upon evaluation today patient's wound does show some signs of being a little bit more moist than what we would like to see. Fortunately there does not appear to be any evidence of active infection locally or systemically which is great news. I do think we need to try to avoid putting this on as thickly and waiting it down as much I believe  there may be too much moisture. She also tells me that she took a shower with the  dressing left in place I think it might of gotten wet through the dressing as well which is also not good I did advise her if she takes a shower she needs to make sure to change the dressing. 5/8; left medial leg. Using Prisma as a 2 of it and border foam she is changing this every second day. This was initially traumatic she states from tearing a Band-Aid off of her original wound which was a little bit more posteriorly 01-10-2022 upon evaluation today patient appears to be doing well with regard to her wound. Fortunately there does not appear to be any evidence of active infection locally or systemically which is great news and overall I am extremely pleased with where we stand today. The biggest issue she has is she really does not like the green color of the bandages were using she hopes that we can try something a little different. 01-17-2022 upon evaluation today patient's wound is actually showing signs of excellent improvement she has been doing great up to this point. I think that she is making wonderful progress and I think we are getting closer every time I see her to complete resolution. Electronic Signature(s) Signed: 01/17/2022 11:14:58 AM By: Worthy Keeler PA-C Entered By: Worthy Keeler on 01/17/2022 11:14:57 Jessica [name] ([id number]) -------------------------------------------------------------------------------- Physical Exam Details Patient Name: Jessica Balzarine [name]. Date of Service: 01/17/2022 10:15 AM Medical Record Number: [id number] Patient Account Number: [id number] Date of Birth/Sex: 1941-05-07 (81 y.o. F) Treating RN: Carlene Coria Primary Care Provider: Early Osmond Other Clinician: Referring Provider: Early Osmond Treating Provider/Extender: Skipper Cliche in Treatment: 7 Constitutional Well-nourished and well-hydrated in no acute distress. Respiratory normal breathing without difficulty. Psychiatric this patient is able to make decisions and  demonstrates good insight into disease process. Alert and Oriented x 3. pleasant and cooperative. Notes Upon inspection patient's wound bed actually showed signs of good granulation and epithelization at this point. Fortunately I do not see any evidence of active infection locally nor systemically which is great news and overall I am extremely pleased with where we stand today. Electronic Signature(s) Signed: 01/17/2022 11:17:36 AM By: Worthy Keeler PA-C Entered By: Worthy Keeler on 01/17/2022 11:17:35 Jessica [name] ([id number]) -------------------------------------------------------------------------------- Physician Orders Details Patient Name: Jessica Balzarine [name]. Date of Service: 01/17/2022 10:15 AM Medical Record Number: [id number] Patient Account Number: [id number] Date of Birth/Sex: 01-12-1941 (81 y.o. F) Treating RN: Carlene Coria Primary Care Provider: Early Osmond Other Clinician: Referring Provider: Early Osmond Treating Provider/Extender: Skipper Cliche in Treatment: 7 Verbal / Phone Orders: No Diagnosis Coding ICD-10 Coding Code Description L98.8 Other specified disorders of the skin and subcutaneous tissue L97.822 Non-pressure chronic ulcer of other part of left lower leg with fat layer exposed I10 Essential (primary) hypertension Follow-up Appointments o Return Appointment in 1 week. Bathing/ Shower/ Hygiene o May shower; gently cleanse wound with antibacterial soap, rinse and pat dry prior to dressing wounds - keep dressing dry or change after shower o No tub bath. Anesthetic (Use 'Patient Medications' Section for Anesthetic Order Entry) o Lidocaine applied to wound bed Edema Control - Lymphedema / Segmental Compressive Device / Other o Elevate, Exercise Daily and Avoid Standing for Long Periods of Time. o Elevate legs to the level of the heart and pump ankles as often as possible o Elevate leg(s) parallel to the floor when  sitting. Additional Orders / Instructions   o Follow Nutritious Diet and Increase Protein Intake Wound Treatment Wound #1 - Lower Leg Wound Laterality: Left Cleanser: Byram Ancillary Kit - 15 Day Supply (Generic) 3 x Per Week/30 Days Discharge Instructions: Use supplies as instructed; Kit contains: (15) Saline Bullets; (15) 3x3 Gauze; 15 pr Gloves Primary Dressing: Hydrofera Blue Ready Transfer Foam, 2.5x2.5 (in/in) 3 x Per Week/30 Days Discharge Instructions: Apply Hydrofera Blue Ready to wound bed as directed Secondary Dressing: (SILICONE BORDER) Zetuvit Plus SILICONE BORDER Dressing 4x4 (in/in) (Generic) 3 x Per Week/30 Days Discharge Instructions: Please do not put silicone bordered dressings under wraps. Use non-bordered dressing only. Electronic Signature(s) Signed: 01/17/2022 4:02:04 PM By: Carlene Coria RN Signed: 01/17/2022 4:26:20 PM By: Worthy Keeler PA-C Entered By: Carlene Coria on 01/17/2022 10:43:18 Jessica [name] ([id number]) -------------------------------------------------------------------------------- Problem List Details Patient Name: Jessica Balzarine [name]. Date of Service: 01/17/2022 10:15 AM Medical Record Number: [id number] Patient Account Number: [id number] Date of Birth/Sex: 10-Nov-1940 (81 y.o. F) Treating RN: Carlene Coria Primary Care Provider: Early Osmond Other Clinician: Referring Provider: Early Osmond Treating Provider/Extender: Skipper Cliche in Treatment: 7 Active Problems ICD-10 Encounter Code Description Active Date MDM Diagnosis L98.8 Other specified disorders of the skin and subcutaneous tissue 11/25/2021 No Yes L97.822 Non-pressure chronic ulcer of other part of left lower leg with fat layer 11/25/2021 No Yes exposed Hunt (primary) hypertension 11/25/2021 No Yes Inactive Problems Resolved Problems Electronic Signature(s) Signed: 01/17/2022 10:29:46 AM By: Worthy Keeler PA-C Entered By: Worthy Keeler on 01/17/2022  10:29:45 Jessica [name] ([id number]) -------------------------------------------------------------------------------- Progress Note Details Patient Name: Jessica Balzarine [name]. Date of Service: 01/17/2022 10:15 AM Medical Record Number: [id number] Patient Account Number: [id number] Date of Birth/Sex: 12-13-1940 (81 y.o. F) Treating RN: Carlene Coria Primary Care Provider: Early Osmond Other Clinician: Referring Provider: Early Osmond Treating Provider/Extender: Skipper Cliche in Treatment: 7 Subjective Chief Complaint Information obtained from Patient Left LE Ulcer History of Present Illness (HPI) 11/25/2021 upon evaluation today patient presents for initial inspection here in our clinic concerning a wound that actually first occurred in October 2022 this was when she got stuck by something when she fell and again this was on the more posterior aspect of her left leg. Subsequently she ended up having a bandage that was on this and when she pulled the bandage off she states it pulled off some skin along the edge. The original wound has closed and unfortunately this area where she pulled off skin has continued to be an issue. Fortunately I do not see any signs of active infection at this time although there is some inflammation and irritation I am concerned about the possibility of pyoderma. There was some eschar/nonviable tissue covering the surface of the wound that was carefully removed with saline and gauze today after the lidocaine had been in place for some time. Nonetheless no sharp debridement was performed per patient request and to be honest I do not think it was absolutely necessary based on how the wound appeared today. The patient does have a history of hypertension but otherwise has no major medical problems she is not significantly swollen no signs of edema whatsoever. 12-06-2021 upon evaluation today patient appears to be doing well with regard to her wound. Actually  do feel like that the [facility] with triamcinolone has helped significantly with the inflammation that we were seeing. Things are doing much better in that regard. With that being said I do not see any signs of infection at this time which is  great news. Overall I think she may actually benefit from the use of collagen based on what I am seeing. 12-13-2021 upon evaluation today patient appears to be doing well with regard to her wound. This is measuring a little bit better which is great news and overall I do not see any signs of active infection locally nor systemically which is great news. 12-20-2021 upon evaluation today patient appears to be doing well currently in regard to her wound. This is showing signs of improvement and week by week we definitely are seeing improvements in the overall size. I feel like that she is on the right track here. There is minimal discomfort she tells me. 12-27-2021 upon evaluation today patient's wound does show some signs of being a little bit more moist than what we would like to see. Fortunately there does not appear to be any evidence of active infection locally or systemically which is great news. I do think we need to try to avoid putting this on as thickly and waiting it down as much I believe there may be too much moisture. She also tells me that she took a shower with the dressing left in place I think it might of gotten wet through the dressing as well which is also not good I did advise her if she takes a shower she needs to make sure to change the dressing. 5/8; left medial leg. Using Prisma as a 2 of it and border foam she is changing this every second day. This was initially traumatic she states from tearing a Band-Aid off of her original wound which was a little bit more posteriorly 01-10-2022 upon evaluation today patient appears to be doing well with regard to her wound. Fortunately there does not appear to be any evidence of active infection  locally or systemically which is great news and overall I am extremely pleased with where we stand today. The biggest issue she has is she really does not like the green color of the bandages were using she hopes that we can try something a little different. 01-17-2022 upon evaluation today patient's wound is actually showing signs of excellent improvement she has been doing great up to this point. I think that she is making wonderful progress and I think we are getting closer every time I see her to complete resolution. Objective Constitutional Well-nourished and well-hydrated in no acute distress. Vitals Time Taken: 10:22 AM, Height: 60 in, Weight: 100 lbs, BMI: 19.5, Temperature: 97.8 F, Pulse: 93 bpm, Respiratory Rate: 18 breaths/min, Blood Pressure: 102/75 mmHg. Respiratory [name], Jessica [name]. ([id number]) normal breathing without difficulty. Psychiatric this patient is able to make decisions and demonstrates good insight into disease process. Alert and Oriented x 3. pleasant and cooperative. General Notes: Upon inspection patient's wound bed actually showed signs of good granulation and epithelization at this point. Fortunately I do not see any evidence of active infection locally nor systemically which is great news and overall I am extremely pleased with where we stand today. Integumentary (Hair, Skin) Wound #1 status is Open. Original cause of wound was Trauma. The date acquired was: 05/29/2021. The wound has been in treatment 7 weeks. The wound is located on the Left Lower Leg. The wound measures 1cm length x 0.7cm width x 0.1cm depth; 0.55cm^2 area and 0.055cm^3 volume. There is Fat Layer (Subcutaneous Tissue) exposed. There is a medium amount of serosanguineous drainage noted. There is large (67- 100%) red, pink granulation within the wound bed. There is a small (1-33%)  amount of necrotic tissue within the wound bed including Adherent Slough. Assessment Active  Problems ICD-10 Other specified disorders of the skin and subcutaneous tissue Non-pressure chronic ulcer of other part of left lower leg with fat layer exposed Essential (primary) hypertension Plan Follow-up Appointments: Return Appointment in 1 week. Bathing/ Shower/ Hygiene: May shower; gently cleanse wound with antibacterial soap, rinse and pat dry prior to dressing wounds - keep dressing dry or change after shower No tub bath. Anesthetic (Use 'Patient Medications' Section for Anesthetic Order Entry): Lidocaine applied to wound bed Edema Control - Lymphedema / Segmental Compressive Device / Other: Elevate, Exercise Daily and Avoid Standing for Long Periods of Time. Elevate legs to the level of the heart and pump ankles as often as possible Elevate leg(s) parallel to the floor when sitting. Additional Orders / Instructions: Follow Nutritious Diet and Increase Protein Intake WOUND #1: - Lower Leg Wound Laterality: Left Cleanser: Byram Ancillary Kit - 15 Day Supply (Generic) 3 x Per Week/30 Days Discharge Instructions: Use supplies as instructed; Kit contains: (15) Saline Bullets; (15) 3x3 Gauze; 15 pr Gloves Primary Dressing: Hydrofera Blue Ready Transfer Foam, 2.5x2.5 (in/in) 3 x Per Week/30 Days Discharge Instructions: Apply Hydrofera Blue Ready to wound bed as directed Secondary Dressing: (SILICONE BORDER) Zetuvit Plus SILICONE BORDER Dressing 4x4 (in/in) (Generic) 3 x Per Week/30 Days Discharge Instructions: Please do not put silicone bordered dressings under wraps. Use non-bordered dressing only. 1. I am going to recommend currently that we go ahead and continue with the [facility] which I feel like is still doing a good job and the patient is in agreement with plan. 2. I am also can recommend that we have the patient continue to monitor for any signs of infection. Obviously if anything changes she should let me know soon as possible. We will see patient back for reevaluation  in 2 weeks here in the clinic. If anything worsens or changes patient will contact our office for additional recommendations. Electronic Signature(s) Signed: 01/17/2022 11:18:02 AM By: Worthy Keeler PA-C Entered By: Worthy Keeler on 01/17/2022 11:18:01 Jessica [name] ([id number]) -------------------------------------------------------------------------------- SuperBill Details Patient Name: Jessica Balzarine [name]. Date of Service: 01/17/2022 Medical Record Number: [id number] Patient Account Number: [id number] Date of Birth/Sex: [birth year] (81 y.o. F) Treating RN: Carlene Coria Primary Care Provider: Early Osmond Other Clinician: Referring Provider: Early Osmond Treating Provider/Extender: Skipper Cliche in Treatment: 7 Diagnosis Coding ICD-10 Codes Code Description L98.8 Other specified disorders of the skin and subcutaneous tissue L97.822 Non-pressure chronic ulcer of other part of left lower leg with fat layer exposed I10 Essential (primary) hypertension Facility Procedures CPT4 Code: [id number] Description: [id number] - WOUND CARE VISIT-LEV 2 EST PT Modifier: Quantity: 1 Physician Procedures CPT4 Code: [id number] Description: 36468 - WC PHYS LEVEL 3 - EST PT Modifier: Quantity: 1 CPT4 Code: Description: ICD-10 Diagnosis Description L98.8 Other specified disorders of the skin and subcutaneous tissue L97.822 Non-pressure chronic ulcer of other part of left lower leg with fat lay I10 Essential (primary) hypertension Modifier: er exposed Quantity: Electronic Signature(s) Signed: 01/17/2022 11:18:43 AM By: Worthy Keeler PA-C Entered By: Worthy Keeler on 01/17/2022 11:18:43

## 2022-01-31 ENCOUNTER — Encounter: Payer: Medicare PPO | Attending: Physician Assistant | Admitting: Physician Assistant

## 2022-01-31 DIAGNOSIS — Z87891 Personal history of nicotine dependence: Secondary | ICD-10-CM | POA: Insufficient documentation

## 2022-01-31 DIAGNOSIS — I10 Essential (primary) hypertension: Secondary | ICD-10-CM | POA: Insufficient documentation

## 2022-01-31 DIAGNOSIS — L97822 Non-pressure chronic ulcer of other part of left lower leg with fat layer exposed: Secondary | ICD-10-CM | POA: Insufficient documentation

## 2022-01-31 NOTE — Progress Notes (Addendum)
MELAYA, HOSELTON ([id number]) Visit Report for 01/31/2022 Arrival Information Details Patient Name: Jessica [name], Jessica [name]. Date of Service: 01/31/2022 10:15 AM Medical Record Number: [id number] Patient Account Number: [id number] Date of Birth/Sex: [birth year] (81 y.o. F) Treating RN: Carlene Coria Primary Care Laurna Shetley: Early Osmond Other Clinician: Referring Meshell Abdulaziz: Early Osmond Treating Vernetta Dizdarevic/Extender: Skipper Cliche in Treatment: 9 Visit Information History Since Last Visit All ordered tests and consults were completed: No Patient Arrived: Ambulatory Added or deleted any medications: No Arrival Time: 10:07 Any new allergies or adverse reactions: No Accompanied By: self Had a fall or experienced change in No Transfer Assistance: None activities of daily living that may affect Patient Identification Verified: Yes risk of falls: Secondary Verification Process Completed: Yes Signs or symptoms of abuse/neglect since last visito No Patient Requires Transmission-Based No Hospitalized since last visit: No Precautions: Implantable device outside of the clinic excluding No Patient Has Alerts: Yes cellular tissue based products placed in the center Patient Alerts: ABI L1.1 TBI .72 3-20- since last visit: 23 Has Dressing in Place as Prescribed: Yes ABI R 1.4 TBI .72 3-20- Pain Present Now: No 23 Electronic Signature(s) Signed: 01/31/2022 5:11:25 PM By: Carlene Coria RN Entered By: Carlene Coria on 01/31/2022 10:12:12 Jessica [name] ([id number]) -------------------------------------------------------------------------------- Clinic Level of Care Assessment Details Patient Name: Jessica Balzarine B. Date of Service: 01/31/2022 10:15 AM Medical Record Number: [id number] Patient Account Number: [id number] Date of Birth/Sex: 11-Sep-1940 (81 y.o. F) Treating RN: Carlene Coria Primary Care Brindley Madarang: Early Osmond Other Clinician: Referring Saia Derossett: Early Osmond Treating  Kelvon Giannini/Extender: Skipper Cliche in Treatment: 9 Clinic Level of Care Assessment Items TOOL 4 Quantity Score X - Use when only an EandM is performed on FOLLOW-UP visit 1 0 ASSESSMENTS - Nursing Assessment / Reassessment X - Reassessment of Co-morbidities (includes updates in patient status) 1 10 X- 1 5 Reassessment of Adherence to Treatment Plan ASSESSMENTS - Wound and Skin Assessment / Reassessment X - Simple Wound Assessment / Reassessment - one wound 1 5 []  - 0 Complex Wound Assessment / Reassessment - multiple wounds []  - 0 Dermatologic / Skin Assessment (not related to wound area) ASSESSMENTS - Focused Assessment []  - Circumferential Edema Measurements - multi extremities 0 []  - 0 Nutritional Assessment / Counseling / Intervention []  - 0 Lower Extremity Assessment (monofilament, tuning fork, pulses) []  - 0 Peripheral Arterial Disease Assessment (using hand held doppler) ASSESSMENTS - Ostomy and/or Continence Assessment and Care []  - Incontinence Assessment and Management 0 []  - 0 Ostomy Care Assessment and Management (repouching, etc.) PROCESS - Coordination of Care X - Simple Patient / Family Education for ongoing care 1 15 []  - 0 Complex (extensive) Patient / Family Education for ongoing care []  - 0 Staff obtains Programmer, systems, Records, Test Results / Process Orders []  - 0 Staff telephones HHA, Nursing Homes / Clarify orders / etc []  - 0 Routine Transfer to another Facility (non-emergent condition) []  - 0 Routine Hospital Admission (non-emergent condition) []  - 0 New Admissions / Biomedical engineer / Ordering NPWT, Apligraf, etc. []  - 0 Emergency Hospital Admission (emergent condition) X- 1 10 Simple Discharge Coordination []  - 0 Complex (extensive) Discharge Coordination PROCESS - Special Needs []  - Pediatric / Minor Patient Management 0 []  - 0 Isolation Patient Management []  - 0 Hearing / Language / Visual special needs []  - 0 Assessment of  Community assistance (transportation, D/C planning, etc.) []  - 0 Additional assistance / Altered mentation []  - 0 Support Surface(s) Assessment (bed, cushion, seat, etc.) INTERVENTIONS -  Wound Cleansing / Measurement Califano, Jessica B. ([id number]) X- 1 5 Simple Wound Cleansing - one wound []  - 0 Complex Wound Cleansing - multiple wounds X- 1 5 Wound Imaging (photographs - any number of wounds) []  - 0 Wound Tracing (instead of photographs) X- 1 5 Simple Wound Measurement - one wound []  - 0 Complex Wound Measurement - multiple wounds INTERVENTIONS - Wound Dressings X - Small Wound Dressing one or multiple wounds 1 10 []  - 0 Medium Wound Dressing one or multiple wounds []  - 0 Large Wound Dressing one or multiple wounds []  - 0 Application of Medications - topical []  - 0 Application of Medications - injection INTERVENTIONS - Miscellaneous []  - External ear exam 0 []  - 0 Specimen Collection (cultures, biopsies, blood, body fluids, etc.) []  - 0 Specimen(s) / Culture(s) sent or taken to Lab for analysis []  - 0 Patient Transfer (multiple staff / Civil Service fast streamer / Similar devices) []  - 0 Simple Staple / Suture removal (25 or less) []  - 0 Complex Staple / Suture removal (26 or more) []  - 0 Hypo / Hyperglycemic Management (close monitor of Blood Glucose) []  - 0 Ankle / Brachial Index (ABI) - do not check if billed separately X- 1 5 Vital Signs Has the patient been seen at the hospital within the last three years: Yes Total Score: 75 Level Of Care: New/Established - Level 2 Electronic Signature(s) Signed: 01/31/2022 5:11:25 PM By: Carlene Coria RN Entered By: Carlene Coria on 01/31/2022 10:42:09 Jessica [name] ([id number]) -------------------------------------------------------------------------------- Encounter Discharge Information Details Patient Name: Jessica Balzarine B. Date of Service: 01/31/2022 10:15 AM Medical Record Number: [id number] Patient Account Number:  [id number] Date of Birth/Sex: Jul 04, 1941 (81 y.o. F) Treating RN: Carlene Coria Primary Care Rheya Minogue: Early Osmond Other Clinician: Referring Landy Dunnavant: Early Osmond Treating Ailsa Mireles/Extender: Skipper Cliche in Treatment: 9 Encounter Discharge Information Items Discharge Condition: Stable Ambulatory Status: Ambulatory Discharge Destination: Home Transportation: Private Auto Accompanied By: self Schedule Follow-up Appointment: Yes Clinical Summary of Care: Electronic Signature(s) Signed: 01/31/2022 5:11:25 PM By: Carlene Coria RN Entered By: Carlene Coria on 01/31/2022 10:42:59 Jessica [name] ([id number]) -------------------------------------------------------------------------------- Lower Extremity Assessment Details Patient Name: Jessica Balzarine B. Date of Service: 01/31/2022 10:15 AM Medical Record Number: [id number] Patient Account Number: [id number] Date of Birth/Sex: 16-Nov-1940 (81 y.o. F) Treating RN: Carlene Coria Primary Care Rudy Domek: Early Osmond Other Clinician: Referring Brandii Lakey: Early Osmond Treating Quaniyah Bugh/Extender: Skipper Cliche in Treatment: 9 Edema Assessment Assessed: [Left: No] [Right: No] Edema: [Left: Ye] [Right: s] Calf Left: Right: Point of Measurement: 32 cm From Medial Instep 30 cm Ankle Left: Right: Point of Measurement: 10 cm From Medial Instep 17 cm Vascular Assessment Pulses: Dorsalis Pedis Palpable: [Left:Yes] Electronic Signature(s) Signed: 01/31/2022 5:11:25 PM By: Carlene Coria RN Entered By: Carlene Coria on 01/31/2022 10:19:17 Jessica [name] ([id number]) -------------------------------------------------------------------------------- Multi Wound Chart Details Patient Name: Jessica Balzarine B. Date of Service: 01/31/2022 10:15 AM Medical Record Number: [id number] Patient Account Number: [id number] Date of Birth/Sex: 09/09/1940 (81 y.o. F) Treating RN: Carlene Coria Primary Care Shadawn Hanaway: Early Osmond Other  Clinician: Referring Aizley Stenseth: Early Osmond Treating Amaziah Ghosh/Extender: Skipper Cliche in Treatment: 9 Vital Signs Height(in): 60 Pulse(bpm): 72 Weight(lbs): 100 Blood Pressure(mmHg): 113/60 Body Mass Index(BMI): 19.5 Temperature(F): 97.8 Respiratory Rate(breaths/min): 18 Photos: [N/A:N/A] Wound Location: Left Lower Leg N/A N/A Wounding Event: Trauma N/A N/A Primary Etiology: Atypical N/A N/A Comorbid History: Hypertension N/A N/A Date Acquired: 05/29/2021 N/A N/A Weeks of Treatment: 9 N/A N/A Wound Status: Open N/A N/A Wound Recurrence: No  N/A N/A Measurements L x W x D (cm) 0.5x0.5x0.1 N/A N/A Area (cm) : 0.196 N/A N/A Volume (cm) : 0.02 N/A N/A % Reduction in Area: 83.80% N/A N/A % Reduction in Volume: 83.50% N/A N/A Classification: Full Thickness Without Exposed N/A N/A Support Structures Exudate Amount: Medium N/A N/A Exudate Type: Serosanguineous N/A N/A Exudate Color: red, brown N/A N/A Granulation Amount: Large (67-100%) N/A N/A Granulation Quality: Red, Pink N/A N/A Necrotic Amount: Small (1-33%) N/A N/A Exposed Structures: Fat Layer (Subcutaneous Tissue): N/A N/A Yes Fascia: No Tendon: No Muscle: No Joint: No Bone: No Epithelialization: None N/A N/A Treatment Notes Electronic Signature(s) Signed: 01/31/2022 5:11:25 PM By: Carlene Coria RN Entered By: Carlene Coria on 01/31/2022 10:19:59 Jessica [name] ([id number]) -------------------------------------------------------------------------------- [place] Details Patient Name: Jessica Balzarine B. Date of Service: 01/31/2022 10:15 AM Medical Record Number: [id number] Patient Account Number: [id number] Date of Birth/Sex: 1940-12-21 (80 y.o. F) Treating RN: Carlene Coria Primary Care Perrin Gens: Early Osmond Other Clinician: Referring Inocente Krach: Early Osmond Treating Deena Shaub/Extender: Skipper Cliche in Treatment: 9 Active Inactive Wound/Skin Impairment Nursing  Diagnoses: Knowledge deficit related to ulceration/compromised skin integrity Goals: Patient/caregiver will verbalize understanding of skin care regimen Date Initiated: 11/25/2021 Target Resolution Date: 01/25/2022 Goal Status: Active Ulcer/skin breakdown will have a volume reduction of 30% by week 4 Date Initiated: 11/25/2021 Date Inactivated: 01/10/2022 Target Resolution Date: 12/26/2021 Goal Status: Unmet Unmet Reason: comobities Ulcer/skin breakdown will have a volume reduction of 50% by week 8 Date Initiated: 11/25/2021 Target Resolution Date: 01/25/2022 Goal Status: Active Ulcer/skin breakdown will have a volume reduction of 80% by week 12 Date Initiated: 11/25/2021 Target Resolution Date: 02/25/2022 Goal Status: Active Ulcer/skin breakdown will heal within 14 weeks Date Initiated: 11/25/2021 Target Resolution Date: 03/27/2022 Goal Status: Active Interventions: Assess patient/caregiver ability to obtain necessary supplies Assess patient/caregiver ability to perform ulcer/skin care regimen upon admission and as needed Assess ulceration(s) every visit Notes: Electronic Signature(s) Signed: 01/31/2022 5:11:25 PM By: Carlene Coria RN Entered By: Carlene Coria on 01/31/2022 10:19:33 Jessica [name] ([id number]) -------------------------------------------------------------------------------- Pain Assessment Details Patient Name: Jessica Balzarine B. Date of Service: 01/31/2022 10:15 AM Medical Record Number: [id number] Patient Account Number: [id number] Date of Birth/Sex: Oct 17, 1940 (81 y.o. F) Treating RN: Carlene Coria Primary Care Royston Bekele: Early Osmond Other Clinician: Referring Amra Shukla: Early Osmond Treating Nihal Marzella/Extender: Skipper Cliche in Treatment: 9 Active Problems Location of Pain Severity and Description of Pain Patient Has Paino No Site Locations Pain Management and Medication Current Pain Management: Electronic Signature(s) Signed: 01/31/2022 5:11:25 PM  By: Carlene Coria RN Entered By: Carlene Coria on 01/31/2022 10:13:37 Jessica [name] ([id number]) -------------------------------------------------------------------------------- Patient/Caregiver Education Details Patient Name: Jessica Balzarine B. Date of Service: 01/31/2022 10:15 AM Medical Record Number: [id number] Patient Account Number: [id number] Date of Birth/Gender: 1941-08-09 (81 y.o. F) Treating RN: Carlene Coria Primary Care Physician: Early Osmond Other Clinician: Referring Physician: Early Osmond Treating Physician/Extender: Skipper Cliche in Treatment: 9 Education Assessment Education Provided To: Patient Education Topics Provided Wound/Skin Impairment: Methods: Explain/Verbal Responses: State content correctly Electronic Signature(s) Signed: 01/31/2022 5:11:25 PM By: Carlene Coria RN Entered By: Carlene Coria on 01/31/2022 10:42:23 Jessica [name] ([id number]) -------------------------------------------------------------------------------- Wound Assessment Details Patient Name: Jessica Balzarine B. Date of Service: 01/31/2022 10:15 AM Medical Record Number: [id number] Patient Account Number: [id number] Date of Birth/Sex: [birth year] (81 y.o. F) Treating RN: Carlene Coria Primary Care Shady Bradish: Early Osmond Other Clinician: Referring Sande Pickert: Early Osmond Treating Orlo Brickle/Extender: Skipper Cliche in Treatment: 9 Wound Status Wound Number: 1 Primary Etiology: Atypical  Wound Location: Left Lower Leg Wound Status: Open Wounding Event: Trauma Comorbid History: Hypertension Date Acquired: 05/29/2021 Weeks Of Treatment: 9 Clustered Wound: No Photos Wound Measurements Length: (cm) 0.5 Width: (cm) 0.5 Depth: (cm) 0.1 Area: (cm) 0.196 Volume: (cm) 0.02 % Reduction in Area: 83.8% % Reduction in Volume: 83.5% Epithelialization: None Tunneling: No Undermining: No Wound Description Classification: Full Thickness Without Exposed Support  Structures Exudate Amount: Medium Exudate Type: Serosanguineous Exudate Color: red, brown Foul Odor After Cleansing: No Slough/Fibrino Yes Wound Bed Granulation Amount: Large (67-100%) Exposed Structure Granulation Quality: Red, Pink Fascia Exposed: No Necrotic Amount: Small (1-33%) Fat Layer (Subcutaneous Tissue) Exposed: Yes Necrotic Quality: Adherent Slough Tendon Exposed: No Muscle Exposed: No Joint Exposed: No Bone Exposed: No Treatment Notes Wound #1 (Lower Leg) Wound Laterality: Left Cleanser [place] Ancillary Kit - 15 Day Supply Discharge Instruction: Use supplies as instructed; Kit contains: (15) Saline Bullets; (15) 3x3 Gauze; 15 pr Gloves Peri-Wound Care Jessica [name], Jessica B. ([id number]) Topical Primary Dressing Hydrofera Blue Ready Transfer Foam, 2.5x2.5 (in/in) Discharge Instruction: Apply Hydrofera Blue Ready to wound bed as directed Secondary Dressing (SILICONE BORDER) Zetuvit Plus SILICONE BORDER Dressing 4x4 (in/in) Discharge Instruction: Please do not put silicone bordered dressings under wraps. Use non-bordered dressing only. Secured With Compression Wrap Compression Stockings Add-Ons Electronic Signature(s) Signed: 01/31/2022 5:11:25 PM By: Carlene Coria RN Entered By: Carlene Coria on 01/31/2022 10:18:24 Jessica [name] ([id number]) -------------------------------------------------------------------------------- Vitals Details Patient Name: Jessica Balzarine B. Date of Service: 01/31/2022 10:15 AM Medical Record Number: [id number] Patient Account Number: [id number] Date of Birth/Sex: 04-Dec-1940 (81 y.o. F) Treating RN: Carlene Coria Primary Care Jeanenne Licea: Early Osmond Other Clinician: Referring Aydian Dimmick: Early Osmond Treating Nyeshia Mysliwiec/Extender: Skipper Cliche in Treatment: 9 Vital Signs Time Taken: 10:12 Temperature (F): 97.8 Height (in): 60 Pulse (bpm): 58 Weight (lbs): 100 Respiratory Rate (breaths/min): 18 Body Mass Index (BMI):  19.5 Blood Pressure (mmHg): 113/60 Reference Range: 80 - 120 mg / dl Electronic Signature(s) Signed: 01/31/2022 5:11:25 PM By: Carlene Coria RN Entered By: Carlene Coria on 01/31/2022 10:13:26

## 2022-01-31 NOTE — Progress Notes (Addendum)
IZZA, BICKLE ([id number]) Visit Report for 01/31/2022 Chief Complaint Document Details Patient Name: Jessica [name], Jessica B. Date of Service: 01/31/2022 10:15 AM Medical Record Number: [id number] Patient Account Number: [id number] Date of Birth/Sex: 1941/08/23 (81 y.o. F) Treating RN: Carlene Coria Primary Care Provider: Early Osmond Other Clinician: Referring Provider: Early Osmond Treating Provider/Extender: Skipper Cliche in Treatment: 9 Information Obtained from: Patient Chief Complaint Left LE Ulcer Electronic Signature(s) Signed: 01/31/2022 10:10:42 AM By: Worthy Keeler PA-C Entered By: Worthy Keeler on 01/31/2022 10:10:41 Jessica [name] ([id number]) -------------------------------------------------------------------------------- HPI Details Patient Name: Jessica Balzarine B. Date of Service: 01/31/2022 10:15 AM Medical Record Number: [id number] Patient Account Number: [id number] Date of Birth/Sex: 1941/02/15 (81 y.o. F) Treating RN: Carlene Coria Primary Care Provider: Early Osmond Other Clinician: Referring Provider: Early Osmond Treating Provider/Extender: Skipper Cliche in Treatment: 9 History of Present Illness HPI Description: 11/25/2021 upon evaluation today patient presents for initial inspection here in our clinic concerning a wound that actually first occurred in October 2022 this was when she got stuck by something when she fell and again this was on the more posterior aspect of her left leg. Subsequently she ended up having a bandage that was on this and when she pulled the bandage off she states it pulled off some skin along the edge. The original wound has closed and unfortunately this area where she pulled off skin has continued to be an issue. Fortunately I do not see any signs of active infection at this time although there is some inflammation and irritation I am concerned about the possibility of pyoderma. There was some eschar/nonviable  tissue covering the surface of the wound that was carefully removed with saline and gauze today after the lidocaine had been in place for some time. Nonetheless no sharp debridement was performed per patient request and to be honest I do not think it was absolutely necessary based on how the wound appeared today. The patient does have a history of hypertension but otherwise has no major medical problems she is not significantly swollen no signs of edema whatsoever. 12-06-2021 upon evaluation today patient appears to be doing well with regard to her wound. Actually do feel like that the [facility] with triamcinolone has helped significantly with the inflammation that we were seeing. Things are doing much better in that regard. With that being said I do not see any signs of infection at this time which is great news. Overall I think she may actually benefit from the use of collagen based on what I am seeing. 12-13-2021 upon evaluation today patient appears to be doing well with regard to her wound. This is measuring a little bit better which is great news and overall I do not see any signs of active infection locally nor systemically which is great news. 12-20-2021 upon evaluation today patient appears to be doing well currently in regard to her wound. This is showing signs of improvement and week by week we definitely are seeing improvements in the overall size. I feel like that she is on the right track here. There is minimal discomfort she tells me. 12-27-2021 upon evaluation today patient's wound does show some signs of being a little bit more moist than what we would like to see. Fortunately there does not appear to be any evidence of active infection locally or systemically which is great news. I do think we need to try to avoid putting this on as thickly and waiting it down as much I believe  there may be too much moisture. She also tells me that she took a shower with the dressing left in place  I think it might of gotten wet through the dressing as well which is also not good I did advise her if she takes a shower she needs to make sure to change the dressing. 5/8; left medial leg. Using Prisma as a 2 of it and border foam she is changing this every second day. This was initially traumatic she states from tearing a Band-Aid off of her original wound which was a little bit more posteriorly 01-10-2022 upon evaluation today patient appears to be doing well with regard to her wound. Fortunately there does not appear to be any evidence of active infection locally or systemically which is great news and overall I am extremely pleased with where we stand today. The biggest issue she has is she really does not like the green color of the bandages were using she hopes that we can try something a little different. 01-17-2022 upon evaluation today patient's wound is actually showing signs of excellent improvement she has been doing great up to this point. I think that she is making wonderful progress and I think we are getting closer every time I see her to complete resolution. 01-31-2022 upon evaluation today patient appears to be doing well with regard to her wound she has been tolerating the dressing changes without complication. Fortunately there does not appear to be any evidence of active infection locally or systemically at this time. Electronic Signature(s) Signed: 01/31/2022 10:45:54 AM By: Worthy Keeler PA-C Entered By: Worthy Keeler on 01/31/2022 10:45:54 Jessica [name] ([id number]) -------------------------------------------------------------------------------- Physical Exam Details Patient Name: Jessica Balzarine B. Date of Service: 01/31/2022 10:15 AM Medical Record Number: [id number] Patient Account Number: [id number] Date of Birth/Sex: 28-Nov-1940 (81 y.o. F) Treating RN: Carlene Coria Primary Care Provider: Early Osmond Other Clinician: Referring Provider: Early Osmond Treating Provider/Extender: Skipper Cliche in Treatment: 9 Constitutional Well-nourished and well-hydrated in no acute distress. Respiratory normal breathing without difficulty. Psychiatric this patient is able to make decisions and demonstrates good insight into disease process. Alert and Oriented x 3. pleasant and cooperative. Notes The wound and forUpon inspection patient's wound bed showed evidence of good granulation physician at this point. Fortunately I do not see any evidence of active infection at this point which is great news and in general I do think that we are on the right track here. Active significantly smaller than just a minimal film noted on the surface of the able to clean this away with saline gauze and a sterile Q-tip. Electronic Signature(s) Signed: 01/31/2022 10:46:21 AM By: Worthy Keeler PA-C Entered By: Worthy Keeler on 01/31/2022 10:46:21 Jessica [name] ([id number]) -------------------------------------------------------------------------------- Physician Orders Details Patient Name: Jessica Balzarine B. Date of Service: 01/31/2022 10:15 AM Medical Record Number: [id number] Patient Account Number: [id number] Date of Birth/Sex: 07-27-1941 (81 y.o. F) Treating RN: Carlene Coria Primary Care Provider: Early Osmond Other Clinician: Referring Provider: Early Osmond Treating Provider/Extender: Skipper Cliche in Treatment: 9 Verbal / Phone Orders: No Diagnosis Coding ICD-10 Coding Code Description L98.8 Other specified disorders of the skin and subcutaneous tissue L97.822 Non-pressure chronic ulcer of other part of left lower leg with fat layer exposed I10 Essential (primary) hypertension Follow-up Appointments o Return Appointment in 1 week. Bathing/ Shower/ Hygiene o May shower; gently cleanse wound with antibacterial soap, rinse and pat dry prior to dressing wounds - keep dressing dry or change after  shower o No tub  bath. Anesthetic (Use 'Patient Medications' Section for Anesthetic Order Entry) o Lidocaine applied to wound bed Edema Control - Lymphedema / Segmental Compressive Device / Other o Elevate, Exercise Daily and Avoid Standing for Long Periods of Time. o Elevate legs to the level of the heart and pump ankles as often as possible o Elevate leg(s) parallel to the floor when sitting. Additional Orders / Instructions o Follow Nutritious Diet and Increase Protein Intake Wound Treatment Wound #1 - Lower Leg Wound Laterality: Left Cleanser: Byram Ancillary Kit - 15 Day Supply (Generic) 3 x Per Week/30 Days Discharge Instructions: Use supplies as instructed; Kit contains: (15) Saline Bullets; (15) 3x3 Gauze; 15 pr Gloves Primary Dressing: Hydrofera Blue Ready Transfer Foam, 2.5x2.5 (in/in) 3 x Per Week/30 Days Discharge Instructions: Apply Hydrofera Blue Ready to wound bed as directed Secondary Dressing: (SILICONE BORDER) Zetuvit Plus SILICONE BORDER Dressing 4x4 (in/in) (Generic) 3 x Per Week/30 Days Discharge Instructions: Please do not put silicone bordered dressings under wraps. Use non-bordered dressing only. Electronic Signature(s) Signed: 01/31/2022 4:19:54 PM By: Worthy Keeler PA-C Signed: 01/31/2022 5:11:25 PM By: Carlene Coria RN Entered By: Carlene Coria on 01/31/2022 10:22:02 Jessica [name] ([id number]) -------------------------------------------------------------------------------- Problem List Details Patient Name: Jessica Balzarine B. Date of Service: 01/31/2022 10:15 AM Medical Record Number: [id number] Patient Account Number: [id number] Date of Birth/Sex: [birth year] (81 y.o. F) Treating RN: Carlene Coria Primary Care Provider: Early Osmond Other Clinician: Referring Provider: Early Osmond Treating Provider/Extender: Skipper Cliche in Treatment: 9 Active Problems ICD-10 Encounter Code Description Active Date MDM Diagnosis L98.8 Other specified disorders of  the skin and subcutaneous tissue 11/25/2021 No Yes L97.822 Non-pressure chronic ulcer of other part of left lower leg with fat layer 11/25/2021 No Yes exposed [place] (primary) hypertension 11/25/2021 No Yes Inactive Problems Resolved Problems Electronic Signature(s) Signed: 01/31/2022 10:10:39 AM By: Worthy Keeler PA-C Entered By: Worthy Keeler on 01/31/2022 10:10:39 Jessica [name] ([id number]) -------------------------------------------------------------------------------- Progress Note Details Patient Name: Jessica Balzarine B. Date of Service: 01/31/2022 10:15 AM Medical Record Number: [id number] Patient Account Number: [id number] Date of Birth/Sex: Jun 03, 1941 (81 y.o. F) Treating RN: Carlene Coria Primary Care Provider: Early Osmond Other Clinician: Referring Provider: Early Osmond Treating Provider/Extender: Skipper Cliche in Treatment: 9 Subjective Chief Complaint Information obtained from Patient Left LE Ulcer History of Present Illness (HPI) 11/25/2021 upon evaluation today patient presents for initial inspection here in our clinic concerning a wound that actually first occurred in October 2022 this was when she got stuck by something when she fell and again this was on the more posterior aspect of her left leg. Subsequently she ended up having a bandage that was on this and when she pulled the bandage off she states it pulled off some skin along the edge. The original wound has closed and unfortunately this area where she pulled off skin has continued to be an issue. Fortunately I do not see any signs of active infection at this time although there is some inflammation and irritation I am concerned about the possibility of pyoderma. There was some eschar/nonviable tissue covering the surface of the wound that was carefully removed with saline and gauze today after the lidocaine had been in place for some time. Nonetheless no sharp debridement was performed per  patient request and to be honest I do not think it was absolutely necessary based on how the wound appeared today. The patient does have a history of hypertension but otherwise has no major  medical problems she is not significantly swollen no signs of edema whatsoever. 12-06-2021 upon evaluation today patient appears to be doing well with regard to her wound. Actually do feel like that the [facility] with triamcinolone has helped significantly with the inflammation that we were seeing. Things are doing much better in that regard. With that being said I do not see any signs of infection at this time which is great news. Overall I think she may actually benefit from the use of collagen based on what I am seeing. 12-13-2021 upon evaluation today patient appears to be doing well with regard to her wound. This is measuring a little bit better which is great news and overall I do not see any signs of active infection locally nor systemically which is great news. 12-20-2021 upon evaluation today patient appears to be doing well currently in regard to her wound. This is showing signs of improvement and week by week we definitely are seeing improvements in the overall size. I feel like that she is on the right track here. There is minimal discomfort she tells me. 12-27-2021 upon evaluation today patient's wound does show some signs of being a little bit more moist than what we would like to see. Fortunately there does not appear to be any evidence of active infection locally or systemically which is great news. I do think we need to try to avoid putting this on as thickly and waiting it down as much I believe there may be too much moisture. She also tells me that she took a shower with the dressing left in place I think it might of gotten wet through the dressing as well which is also not good I did advise her if she takes a shower she needs to make sure to change the dressing. 5/8; left medial leg. Using  Prisma as a 2 of it and border foam she is changing this every second day. This was initially traumatic she states from tearing a Band-Aid off of her original wound which was a little bit more posteriorly 01-10-2022 upon evaluation today patient appears to be doing well with regard to her wound. Fortunately there does not appear to be any evidence of active infection locally or systemically which is great news and overall I am extremely pleased with where we stand today. The biggest issue she has is she really does not like the green color of the bandages were using she hopes that we can try something a little different. 01-17-2022 upon evaluation today patient's wound is actually showing signs of excellent improvement she has been doing great up to this point. I think that she is making wonderful progress and I think we are getting closer every time I see her to complete resolution. 01-31-2022 upon evaluation today patient appears to be doing well with regard to her wound she has been tolerating the dressing changes without complication. Fortunately there does not appear to be any evidence of active infection locally or systemically at this time. Objective Constitutional Well-nourished and well-hydrated in no acute distress. Vitals Time Taken: 10:12 AM, Height: 60 in, Weight: 100 lbs, BMI: 19.5, Temperature: 97.8 F, Pulse: 58 bpm, Respiratory Rate: 18 breaths/min, Blood Pressure: 113/60 mmHg. Jessica [name], Jessica B. ([id number]) Respiratory normal breathing without difficulty. Psychiatric this patient is able to make decisions and demonstrates good insight into disease process. Alert and Oriented x 3. pleasant and cooperative. General Notes: The wound and forUpon inspection patient's wound bed showed evidence of good granulation physician at  this point. Fortunately I do not see any evidence of active infection at this point which is great news and in general I do think that we are on the right track  here. Active significantly smaller than just a minimal film noted on the surface of the able to clean this away with saline gauze and a sterile Q-tip. Integumentary (Hair, Skin) Wound #1 status is Open. Original cause of wound was Trauma. The date acquired was: 05/29/2021. The wound has been in treatment 9 weeks. The wound is located on the Left Lower Leg. The wound measures 0.5cm length x 0.5cm width x 0.1cm depth; 0.196cm^2 area and 0.02cm^3 volume. There is Fat Layer (Subcutaneous Tissue) exposed. There is no tunneling or undermining noted. There is a medium amount of serosanguineous drainage noted. There is large (67-100%) red, pink granulation within the wound bed. There is a small (1-33%) amount of necrotic tissue within the wound bed including Adherent Slough. Assessment Active Problems ICD-10 Other specified disorders of the skin and subcutaneous tissue Non-pressure chronic ulcer of other part of left lower leg with fat layer exposed Essential (primary) hypertension Plan Follow-up Appointments: Return Appointment in 1 week. Bathing/ Shower/ Hygiene: May shower; gently cleanse wound with antibacterial soap, rinse and pat dry prior to dressing wounds - keep dressing dry or change after shower No tub bath. Anesthetic (Use 'Patient Medications' Section for Anesthetic Order Entry): Lidocaine applied to wound bed Edema Control - Lymphedema / Segmental Compressive Device / Other: Elevate, Exercise Daily and Avoid Standing for Long Periods of Time. Elevate legs to the level of the heart and pump ankles as often as possible Elevate leg(s) parallel to the floor when sitting. Additional Orders / Instructions: Follow Nutritious Diet and Increase Protein Intake WOUND #1: - Lower Leg Wound Laterality: Left Cleanser: Byram Ancillary Kit - 15 Day Supply (Generic) 3 x Per Week/30 Days Discharge Instructions: Use supplies as instructed; Kit contains: (15) Saline Bullets; (15) 3x3 Gauze; 15 pr  Gloves Primary Dressing: Hydrofera Blue Ready Transfer Foam, 2.5x2.5 (in/in) 3 x Per Week/30 Days Discharge Instructions: Apply Hydrofera Blue Ready to wound bed as directed Secondary Dressing: (SILICONE BORDER) Zetuvit Plus SILICONE BORDER Dressing 4x4 (in/in) (Generic) 3 x Per Week/30 Days Discharge Instructions: Please do not put silicone bordered dressings under wraps. Use non-bordered dressing only. 1. I would recommend that we go ahead and continue with the wound care measures as before and the patient is in agreement with plan this includes the use of the [facility]. I do believe that this is doing a really good job at helping to keep the wound headed in the right direction over the past several weeks we have made significant improvements here. 2. Months ago to recommend that we have the patient continue to change this 3 times per week which I think is a good regimen here as well. We will see patient back for reevaluation in 1 week here in the clinic. If anything worsens or changes patient will contact our office for additional recommendations. Electronic Signature(s) Signed: 01/31/2022 10:47:07 AM By: Worthy Keeler PA-C Entered By: Worthy Keeler on 01/31/2022 10:47:07 Jessica [name] ([id number]) Jessica [name], Jessica [name] ([id number]) -------------------------------------------------------------------------------- SuperBill Details Patient Name: Jessica Balzarine B. Date of Service: 01/31/2022 Medical Record Number: [id number] Patient Account Number: [id number] Date of Birth/Sex: [birth year] (81 y.o. F) Treating RN: Carlene Coria Primary Care Provider: Early Osmond Other Clinician: Referring Provider: Early Osmond Treating Provider/Extender: Skipper Cliche in Treatment: 9 Diagnosis Coding ICD-10 Codes Code Description  L98.8 Other specified disorders of the skin and subcutaneous tissue L97.822 Non-pressure chronic ulcer of other part of left lower leg with fat layer  exposed I10 Essential (primary) hypertension Facility Procedures CPT4 Code: [id number] Description: [phone] - WOUND CARE VISIT-LEV 2 EST PT Modifier: Quantity: 1 Physician Procedures CPT4 Code: 3074600 Description: 29847 - WC PHYS LEVEL 3 - EST PT Modifier: Quantity: 1 CPT4 Code: Description: ICD-10 Diagnosis Description L98.8 Other specified disorders of the skin and subcutaneous tissue L97.822 Non-pressure chronic ulcer of other part of left lower leg with fat lay I10 Essential (primary) hypertension Modifier: er exposed Quantity: Electronic Signature(s) Signed: 01/31/2022 10:47:19 AM By: Worthy Keeler PA-C Entered By: Worthy Keeler on 01/31/2022 10:47:19

## 2022-02-07 ENCOUNTER — Encounter: Payer: Medicare PPO | Admitting: Physician Assistant

## 2022-02-07 DIAGNOSIS — L97822 Non-pressure chronic ulcer of other part of left lower leg with fat layer exposed: Secondary | ICD-10-CM | POA: Diagnosis not present

## 2022-02-07 DIAGNOSIS — Z87891 Personal history of nicotine dependence: Secondary | ICD-10-CM | POA: Diagnosis not present

## 2022-02-07 DIAGNOSIS — I10 Essential (primary) hypertension: Secondary | ICD-10-CM | POA: Diagnosis not present

## 2022-02-07 NOTE — Progress Notes (Addendum)
ANGELES, [name] ([id number]) Visit Report for 02/07/2022 Chief Complaint Document Details Patient Name: [name], [name]. Date of Service: 02/07/2022 10:15 AM Medical Record Number: [id number] Patient Account Number: [id number] Date of Birth/Sex: 07-31-1941 (81 y.o. F) Treating RN: Carlene Coria Primary Care Provider: Early Osmond Other Clinician: Referring Provider: Early Osmond Treating Provider/Extender: Skipper Cliche in Treatment: 10 Information Obtained from: Patient Chief Complaint Left LE Ulcer Electronic Signature(s) Signed: 02/07/2022 10:19:15 AM By: Worthy Keeler PA-C Entered By: Worthy Keeler on 02/07/2022 10:19:15 Jessica [name] ([id number]) -------------------------------------------------------------------------------- HPI Details Patient Name: Jessica [name]. Date of Service: 02/07/2022 10:15 AM Medical Record Number: [id number] Patient Account Number: [id number] Date of Birth/Sex: 31-Aug-1940 (81 y.o. F) Treating RN: Carlene Coria Primary Care Provider: Early Osmond Other Clinician: Referring Provider: Early Osmond Treating Provider/Extender: Skipper Cliche in Treatment: 10 History of Present Illness HPI Description: 11/25/2021 upon evaluation today patient presents for initial inspection here in our clinic concerning a wound that actually first occurred in October 2022 this was when she got stuck by something when she fell and again this was on the more posterior aspect of her left leg. Subsequently she ended up having a bandage that was on this and when she pulled the bandage off she states it pulled off some skin along the edge. The original wound has closed and unfortunately this area where she pulled off skin has continued to be an issue. Fortunately I do not see any signs of active infection at this time although there is some inflammation and irritation I am concerned about the possibility of pyoderma. There was some  eschar/nonviable tissue covering the surface of the wound that was carefully removed with saline and gauze today after the lidocaine had been in place for some time. Nonetheless no sharp debridement was performed per patient request and to be honest I do not think it was absolutely necessary based on how the wound appeared today. The patient does have a history of hypertension but otherwise has no major medical problems she is not significantly swollen no signs of edema whatsoever. 12-06-2021 upon evaluation today patient appears to be doing well with regard to her wound. Actually do feel like that the [facility] with triamcinolone has helped significantly with the inflammation that we were seeing. Things are doing much better in that regard. With that being said I do not see any signs of infection at this time which is great news. Overall I think she may actually benefit from the use of collagen based on what I am seeing. 12-13-2021 upon evaluation today patient appears to be doing well with regard to her wound. This is measuring a little bit better which is great news and overall I do not see any signs of active infection locally nor systemically which is great news. 12-20-2021 upon evaluation today patient appears to be doing well currently in regard to her wound. This is showing signs of improvement and week by week we definitely are seeing improvements in the overall size. I feel like that she is on the right track here. There is minimal discomfort she tells me. 12-27-2021 upon evaluation today patient's wound does show some signs of being a little bit more moist than what we would like to see. Fortunately there does not appear to be any evidence of active infection locally or systemically which is great news. I do think we need to try to avoid putting this on as thickly and waiting it down as much I believe  there may be too much moisture. She also tells me that she took a shower with the  dressing left in place I think it might of gotten wet through the dressing as well which is also not good I did advise her if she takes a shower she needs to make sure to change the dressing. 5/8; left medial leg. Using Prisma as a 2 of it and border foam she is changing this every second day. This was initially traumatic she states from tearing a Band-Aid off of her original wound which was a little bit more posteriorly 01-10-2022 upon evaluation today patient appears to be doing well with regard to her wound. Fortunately there does not appear to be any evidence of active infection locally or systemically which is great news and overall I am extremely pleased with where we stand today. The biggest issue she has is she really does not like the green color of the bandages were using she hopes that we can try something a little different. 01-17-2022 upon evaluation today patient's wound is actually showing signs of excellent improvement she has been doing great up to this point. I think that she is making wonderful progress and I think we are getting closer every time I see her to complete resolution. 01-31-2022 upon evaluation today patient appears to be doing well with regard to her wound she has been tolerating the dressing changes without complication. Fortunately there does not appear to be any evidence of active infection locally or systemically at this time. 02-07-2022 upon evaluation today patient's wound actually showed signs of excellent improvement in. Please with where we stand although it is about the same size as last week I think we need to make a little bit of a change in regards to the actual dressing. The Hydrofera Blue just seems to be keeping this much too dry. Electronic Signature(s) Signed: 02/07/2022 11:25:59 AM By: Worthy Keeler PA-C Entered By: Worthy Keeler on 02/07/2022 11:25:59 Jessica [name]  ([id number]) -------------------------------------------------------------------------------- Physical Exam Details Patient Name: Jessica [name]. Date of Service: 02/07/2022 10:15 AM Medical Record Number: [id number] Patient Account Number: [id number] Date of Birth/Sex: 04/13/1941 (81 y.o. F) Treating RN: Carlene Coria Primary Care Provider: Early Osmond Other Clinician: Referring Provider: Early Osmond Treating Provider/Extender: Skipper Cliche in Treatment: 21 Constitutional Well-nourished and well-hydrated in no acute distress. Respiratory normal breathing without difficulty. Psychiatric this patient is able to make decisions and demonstrates good insight into disease process. Alert and Oriented x 3. pleasant and cooperative. Notes Upon inspection patient's wound bed showed signs of good granulation epithelization at this point. Fortunately I do not see any evidence of infection locally or systemically which is great news. Electronic Signature(s) Signed: 02/07/2022 11:26:12 AM By: Worthy Keeler PA-C Entered By: Worthy Keeler on 02/07/2022 11:26:12 Jessica [name] ([id number]) -------------------------------------------------------------------------------- Physician Orders Details Patient Name: Jessica [name]. Date of Service: 02/07/2022 10:15 AM Medical Record Number: [id number] Patient Account Number: [id number] Date of Birth/Sex: 08/13/1941 (81 y.o. F) Treating RN: Carlene Coria Primary Care Provider: Early Osmond Other Clinician: Referring Provider: Early Osmond Treating Provider/Extender: Skipper Cliche in Treatment: 10 Verbal / Phone Orders: No Diagnosis Coding ICD-10 Coding Code Description L98.8 Other specified disorders of the skin and subcutaneous tissue L97.822 Non-pressure chronic ulcer of other part of left lower leg with fat layer exposed I10 Essential (primary) hypertension Follow-up Appointments o Return Appointment in 1  week. Bathing/ Shower/ Hygiene o May shower; gently cleanse wound with antibacterial soap,  rinse and pat dry prior to dressing wounds - keep dressing dry or change after shower o No tub bath. Anesthetic (Use 'Patient Medications' Section for Anesthetic Order Entry) o Lidocaine applied to wound bed Edema Control - Lymphedema / Segmental Compressive Device / Other o Elevate, Exercise Daily and Avoid Standing for Long Periods of Time. o Elevate legs to the level of the heart and pump ankles as often as possible o Elevate leg(s) parallel to the floor when sitting. Additional Orders / Instructions o Follow Nutritious Diet and Increase Protein Intake Wound Treatment Wound #1 - Lower Leg Wound Laterality: Left Cleanser: Byram Ancillary Kit - 15 Day Supply (Generic) 3 x Per Week/30 Days Discharge Instructions: Use supplies as instructed; Kit contains: (15) Saline Bullets; (15) 3x3 Gauze; 15 pr Gloves Primary Dressing: Prisma 4.34 (in) 3 x Per Week/30 Days Discharge Instructions: Moisten w/normal saline or sterile water; Cover wound as directed. Do not remove from wound bed. Secondary Dressing: (SILICONE BORDER) Zetuvit Plus SILICONE BORDER Dressing 4x4 (in/in) (Generic) 3 x Per Week/30 Days Discharge Instructions: Please do not put silicone bordered dressings under wraps. Use non-bordered dressing only. Electronic Signature(s) Signed: 02/07/2022 3:46:28 PM By: Worthy Keeler PA-C Signed: 02/08/2022 2:49:16 PM By: Carlene Coria RN Entered By: Carlene Coria on 02/07/2022 10:47:46 Jessica [name] ([id number]) -------------------------------------------------------------------------------- Problem List Details Patient Name: Jessica [name]. Date of Service: 02/07/2022 10:15 AM Medical Record Number: [id number] Patient Account Number: [id number] Date of Birth/Sex: [birth year] (80 y.o. F) Treating RN: Carlene Coria Primary Care Provider: Early Osmond Other Clinician: Referring  Provider: Early Osmond Treating Provider/Extender: Skipper Cliche in Treatment: 10 Active Problems ICD-10 Encounter Code Description Active Date MDM Diagnosis L98.8 Other specified disorders of the skin and subcutaneous tissue 11/25/2021 No Yes L97.822 Non-pressure chronic ulcer of other part of left lower leg with fat layer 11/25/2021 No Yes exposed [place] (primary) hypertension 11/25/2021 No Yes Inactive Problems Resolved Problems Electronic Signature(s) Signed: 02/07/2022 10:19:09 AM By: Worthy Keeler PA-C Entered By: Worthy Keeler on 02/07/2022 10:19:09 Jessica [name] ([id number]) -------------------------------------------------------------------------------- Progress Note Details Patient Name: Jessica [name]. Date of Service: 02/07/2022 10:15 AM Medical Record Number: [id number] Patient Account Number: [id number] Date of Birth/Sex: 09-10-1940 (81 y.o. F) Treating RN: Carlene Coria Primary Care Provider: Early Osmond Other Clinician: Referring Provider: Early Osmond Treating Provider/Extender: Skipper Cliche in Treatment: 10 Subjective Chief Complaint Information obtained from Patient Left LE Ulcer History of Present Illness (HPI) 11/25/2021 upon evaluation today patient presents for initial inspection here in our clinic concerning a wound that actually first occurred in October 2022 this was when she got stuck by something when she fell and again this was on the more posterior aspect of her left leg. Subsequently she ended up having a bandage that was on this and when she pulled the bandage off she states it pulled off some skin along the edge. The original wound has closed and unfortunately this area where she pulled off skin has continued to be an issue. Fortunately I do not see any signs of active infection at this time although there is some inflammation and irritation I am concerned about the possibility of pyoderma. There was  some eschar/nonviable tissue covering the surface of the wound that was carefully removed with saline and gauze today after the lidocaine had been in place for some time. Nonetheless no sharp debridement was performed per patient request and to be honest I do not think it was absolutely necessary based on how  the wound appeared today. The patient does have a history of hypertension but otherwise has no major medical problems she is not significantly swollen no signs of edema whatsoever. 12-06-2021 upon evaluation today patient appears to be doing well with regard to her wound. Actually do feel like that the [facility] with triamcinolone has helped significantly with the inflammation that we were seeing. Things are doing much better in that regard. With that being said I do not see any signs of infection at this time which is great news. Overall I think she may actually benefit from the use of collagen based on what I am seeing. 12-13-2021 upon evaluation today patient appears to be doing well with regard to her wound. This is measuring a little bit better which is great news and overall I do not see any signs of active infection locally nor systemically which is great news. 12-20-2021 upon evaluation today patient appears to be doing well currently in regard to her wound. This is showing signs of improvement and week by week we definitely are seeing improvements in the overall size. I feel like that she is on the right track here. There is minimal discomfort she tells me. 12-27-2021 upon evaluation today patient's wound does show some signs of being a little bit more moist than what we would like to see. Fortunately there does not appear to be any evidence of active infection locally or systemically which is great news. I do think we need to try to avoid putting this on as thickly and waiting it down as much I believe there may be too much moisture. She also tells me that she took a shower with the  dressing left in place I think it might of gotten wet through the dressing as well which is also not good I did advise her if she takes a shower she needs to make sure to change the dressing. 5/8; left medial leg. Using Prisma as a 2 of it and border foam she is changing this every second day. This was initially traumatic she states from tearing a Band-Aid off of her original wound which was a little bit more posteriorly 01-10-2022 upon evaluation today patient appears to be doing well with regard to her wound. Fortunately there does not appear to be any evidence of active infection locally or systemically which is great news and overall I am extremely pleased with where we stand today. The biggest issue she has is she really does not like the green color of the bandages were using she hopes that we can try something a little different. 01-17-2022 upon evaluation today patient's wound is actually showing signs of excellent improvement she has been doing great up to this point. I think that she is making wonderful progress and I think we are getting closer every time I see her to complete resolution. 01-31-2022 upon evaluation today patient appears to be doing well with regard to her wound she has been tolerating the dressing changes without complication. Fortunately there does not appear to be any evidence of active infection locally or systemically at this time. 02-07-2022 upon evaluation today patient's wound actually showed signs of excellent improvement in. Please with where we stand although it is about the same size as last week I think we need to make a little bit of a change in regards to the actual dressing. The Hydrofera Blue just seems to be keeping this much too dry. Objective Constitutional Schnyder, Toyna [name]. ([id number]) Well-nourished and well-hydrated in  no acute distress. Vitals Time Taken: 10:18 AM, Height: 60 in, Weight: 100 lbs, BMI: 19.5, Temperature: 98.2 F, Pulse: 58 bpm,  Respiratory Rate: 18 breaths/min, Blood Pressure: 125/57 mmHg. Respiratory normal breathing without difficulty. Psychiatric this patient is able to make decisions and demonstrates good insight into disease process. Alert and Oriented x 3. pleasant and cooperative. General Notes: Upon inspection patient's wound bed showed signs of good granulation epithelization at this point. Fortunately I do not see any evidence of infection locally or systemically which is great news. Integumentary (Hair, Skin) Wound #1 status is Open. Original cause of wound was Trauma. The date acquired was: 05/29/2021. The wound has been in treatment 10 weeks. The wound is located on the Left Lower Leg. The wound measures 0.5cm length x 0.5cm width x 0.1cm depth; 0.196cm^2 area and 0.02cm^3 volume. There is Fat Layer (Subcutaneous Tissue) exposed. There is no tunneling or undermining noted. There is a medium amount of serosanguineous drainage noted. There is large (67-100%) red, pink granulation within the wound bed. There is a small (1-33%) amount of necrotic tissue within the wound bed including Adherent Slough. Assessment Active Problems ICD-10 Other specified disorders of the skin and subcutaneous tissue Non-pressure chronic ulcer of other part of left lower leg with fat layer exposed Essential (primary) hypertension Plan Follow-up Appointments: Return Appointment in 1 week. Bathing/ Shower/ Hygiene: May shower; gently cleanse wound with antibacterial soap, rinse and pat dry prior to dressing wounds - keep dressing dry or change after shower No tub bath. Anesthetic (Use 'Patient Medications' Section for Anesthetic Order Entry): Lidocaine applied to wound bed Edema Control - Lymphedema / Segmental Compressive Device / Other: Elevate, Exercise Daily and Avoid Standing for Long Periods of Time. Elevate legs to the level of the heart and pump ankles as often as possible Elevate leg(s) parallel to the floor when  sitting. Additional Orders / Instructions: Follow Nutritious Diet and Increase Protein Intake WOUND #1: - Lower Leg Wound Laterality: Left Cleanser: Byram Ancillary Kit - 15 Day Supply (Generic) 3 x Per Week/30 Days Discharge Instructions: Use supplies as instructed; Kit contains: (15) Saline Bullets; (15) 3x3 Gauze; 15 pr Gloves Primary Dressing: Prisma 4.34 (in) 3 x Per Week/30 Days Discharge Instructions: Moisten w/normal saline or sterile water; Cover wound as directed. Do not remove from wound bed. Secondary Dressing: (SILICONE BORDER) Zetuvit Plus SILICONE BORDER Dressing 4x4 (in/in) (Generic) 3 x Per Week/30 Days Discharge Instructions: Please do not put silicone bordered dressings under wraps. Use non-bordered dressing only. 1. I would recommend currently that we go ahead and discontinue the Hydrofera Blue switching over to silver collagen. 2. Continue with the bordered foam dressing to cover. 3. Monitor for any signs of worsening or infection though everything seems to be doing well currently. We will see patient back for reevaluation in 1 week here in the clinic. If anything worsens or changes patient will contact our office for additional recommendations. Electronic Signature(s) NINAMARIE, KEEL ([id number]) Signed: 02/07/2022 11:26:56 AM By: Worthy Keeler PA-C Entered By: Worthy Keeler on 02/07/2022 11:26:55 Jessica [name] ([id number]) -------------------------------------------------------------------------------- SuperBill Details Patient Name: Jessica [name]. Date of Service: 02/07/2022 Medical Record Number: [id number] Patient Account Number: [id number] Date of Birth/Sex: [birth year] (81 y.o. F) Treating RN: Carlene Coria Primary Care Provider: Early Osmond Other Clinician: Referring Provider: Early Osmond Treating Provider/Extender: Skipper Cliche in Treatment: 10 Diagnosis Coding ICD-10 Codes Code Description L98.8 Other specified disorders of  the skin and subcutaneous tissue L97.822 Non-pressure chronic ulcer  of other part of left lower leg with fat layer exposed [place] (primary) hypertension Facility Procedures CPT4 Code: [id number] Description: [phone] - WOUND CARE VISIT-LEV 2 EST PT Modifier: Quantity: 1 Physician Procedures CPT4 Code: [id number] Description: 20721 - WC PHYS LEVEL 3 - EST PT Modifier: Quantity: 1 CPT4 Code: Description: ICD-10 Diagnosis Description L98.8 Other specified disorders of the skin and subcutaneous tissue L97.822 Non-pressure chronic ulcer of other part of left lower leg with fat lay I10 Essential (primary) hypertension Modifier: er exposed Quantity: Electronic Signature(s) Signed: 02/07/2022 11:27:06 AM By: Worthy Keeler PA-C Entered By: Worthy Keeler on 02/07/2022 11:27:06

## 2022-02-08 NOTE — Progress Notes (Signed)
DEYRA, PERDOMO ([id number]) Visit Report for 02/07/2022 Arrival Information Details Patient Name: Jessica [name], Jessica [name]. Date of Service: 02/07/2022 10:15 AM Medical Record Number: [id number] Patient Account Number: [id number] Date of Birth/Sex: Jun 14, 1941 (81 y.o. F) Treating RN: Carlene Coria Primary Care Rowen Wilmer: Early Osmond Other Clinician: Referring Wen Merced: Early Osmond Treating Bandy Honaker/Extender: Skipper Cliche in Treatment: 10 Visit Information History Since Last Visit All ordered tests and consults were completed: No Patient Arrived: Ambulatory Added or deleted any medications: No Arrival Time: 10:14 Any new allergies or adverse reactions: No Accompanied By: self Had a fall or experienced change in No Transfer Assistance: None activities of daily living that may affect Patient Identification Verified: Yes risk of falls: Secondary Verification Process Completed: Yes Signs or symptoms of abuse/neglect since last visito No Patient Requires Transmission-Based No Hospitalized since last visit: No Precautions: Implantable device outside of the clinic excluding No Patient Has Alerts: Yes cellular tissue based products placed in the center Patient Alerts: ABI L1.1 TBI .72 3-20- since last visit: 23 Has Dressing in Place as Prescribed: Yes ABI R 1.4 TBI .72 3-20- Pain Present Now: No 23 Electronic Signature(s) Signed: 02/08/2022 2:49:16 PM By: Carlene Coria RN Entered By: Carlene Coria on 02/07/2022 10:18:18 Jessica [name] ([id number]) -------------------------------------------------------------------------------- Clinic Level of Care Assessment Details Patient Name: Jessica Balzarine B. Date of Service: 02/07/2022 10:15 AM Medical Record Number: [id number] Patient Account Number: [id number] Date of Birth/Sex: [birth year] (81 y.o. F) Treating RN: Carlene Coria Primary Care Josua Ferrebee: Early Osmond Other Clinician: Referring Ahlijah Raia: Early Osmond Treating Virgie Kunda/Extender: Skipper Cliche in Treatment: 10 Clinic Level of Care Assessment Items TOOL 4 Quantity Score X - Use when only an EandM is performed on FOLLOW-UP visit 1 0 ASSESSMENTS - Nursing Assessment / Reassessment X - Reassessment of Co-morbidities (includes updates in patient status) 1 10 X- 1 5 Reassessment of Adherence to Treatment Plan ASSESSMENTS - Wound and Skin Assessment / Reassessment X - Simple Wound Assessment / Reassessment - one wound 1 5 []  - 0 Complex Wound Assessment / Reassessment - multiple wounds []  - 0 Dermatologic / Skin Assessment (not related to wound area) ASSESSMENTS - Focused Assessment []  - Circumferential Edema Measurements - multi extremities 0 []  - 0 Nutritional Assessment / Counseling / Intervention []  - 0 Lower Extremity Assessment (monofilament, tuning fork, pulses) []  - 0 Peripheral Arterial Disease Assessment (using hand held doppler) ASSESSMENTS - Ostomy and/or Continence Assessment and Care []  - Incontinence Assessment and Management 0 []  - 0 Ostomy Care Assessment and Management (repouching, etc.) PROCESS - Coordination of Care X - Simple Patient / Family Education for ongoing care 1 15 []  - 0 Complex (extensive) Patient / Family Education for ongoing care []  - 0 Staff obtains Programmer, systems, Records, Test Results / Process Orders []  - 0 Staff telephones HHA, Nursing Homes / Clarify orders / etc []  - 0 Routine Transfer to another Facility (non-emergent condition) []  - 0 Routine Hospital Admission (non-emergent condition) []  - 0 New Admissions / Biomedical engineer / Ordering NPWT, Apligraf, etc. []  - 0 Emergency Hospital Admission (emergent condition) X- 1 10 Simple Discharge Coordination []  - 0 Complex (extensive) Discharge Coordination PROCESS - Special Needs []  - Pediatric / Minor Patient Management 0 []  - 0 Isolation Patient Management []  - 0 Hearing / Language / Visual special needs []  -  0 Assessment of Community assistance (transportation, D/C planning, etc.) []  - 0 Additional assistance / Altered mentation []  - 0 Support Surface(s) Assessment (bed, cushion, seat, etc.) INTERVENTIONS -  Wound Cleansing / Measurement Finlayson, Hajra B. ([id number]) X- 1 5 Simple Wound Cleansing - one wound []  - 0 Complex Wound Cleansing - multiple wounds X- 1 5 Wound Imaging (photographs - any number of wounds) []  - 0 Wound Tracing (instead of photographs) X- 1 5 Simple Wound Measurement - one wound []  - 0 Complex Wound Measurement - multiple wounds INTERVENTIONS - Wound Dressings X - Small Wound Dressing one or multiple wounds 1 10 []  - 0 Medium Wound Dressing one or multiple wounds []  - 0 Large Wound Dressing one or multiple wounds []  - 0 Application of Medications - topical []  - 0 Application of Medications - injection INTERVENTIONS - Miscellaneous []  - External ear exam 0 []  - 0 Specimen Collection (cultures, biopsies, blood, body fluids, etc.) []  - 0 Specimen(s) / Culture(s) sent or taken to Lab for analysis []  - 0 Patient Transfer (multiple staff / Civil Service fast streamer / Similar devices) []  - 0 Simple Staple / Suture removal (25 or less) []  - 0 Complex Staple / Suture removal (26 or more) []  - 0 Hypo / Hyperglycemic Management (close monitor of Blood Glucose) []  - 0 Ankle / Brachial Index (ABI) - do not check if billed separately X- 1 5 Vital Signs Has the patient been seen at the hospital within the last three years: Yes Total Score: 75 Level Of Care: New/Established - Level 2 Electronic Signature(s) Signed: 02/08/2022 2:49:16 PM By: Carlene Coria RN Entered By: Carlene Coria on 02/07/2022 10:48:16 Jessica [name] ([id number]) -------------------------------------------------------------------------------- Encounter Discharge Information Details Patient Name: Jessica Balzarine B. Date of Service: 02/07/2022 10:15 AM Medical Record Number: [id number] Patient  Account Number: [id number] Date of Birth/Sex: [birth year] (81 y.o. F) Treating RN: Carlene Coria Primary Care Kentrell Guettler: Early Osmond Other Clinician: Referring Tahlia Deamer: Early Osmond Treating Bandy Honaker/Extender: Skipper Cliche in Treatment: 10 Encounter Discharge Information Items Discharge Condition: Stable Ambulatory Status: Ambulatory Discharge Destination: Home Transportation: Private Auto Accompanied By: self Schedule Follow-up Appointment: Yes Clinical Summary of Care: Electronic Signature(s) Signed: 02/08/2022 2:49:16 PM By: Carlene Coria RN Entered By: Carlene Coria on 02/07/2022 12:39:02 Jessica [name] ([id number]) -------------------------------------------------------------------------------- Lower Extremity Assessment Details Patient Name: Jessica Balzarine B. Date of Service: 02/07/2022 10:15 AM Medical Record Number: [id number] Patient Account Number: [id number] Date of Birth/Sex: 06-19-1941 (81 y.o. F) Treating RN: Carlene Coria Primary Care Dysen Edmondson: Early Osmond Other Clinician: Referring Nollan Muldrow: Early Osmond Treating Debbrah Sampedro/Extender: Skipper Cliche in Treatment: 10 Edema Assessment Assessed: [Left: No] [Right: No] Edema: [Left: Ye] [Right: s] Calf Left: Right: Point of Measurement: 32 cm From Medial Instep 31 cm Ankle Left: Right: Point of Measurement: 10 cm From Medial Instep 17 cm Vascular Assessment Pulses: Dorsalis Pedis Palpable: [Left:Yes] Electronic Signature(s) Signed: 02/08/2022 2:49:16 PM By: Carlene Coria RN Entered By: Carlene Coria on 02/07/2022 10:24:51 Jessica [name] ([id number]) -------------------------------------------------------------------------------- Multi Wound Chart Details Patient Name: Jessica Balzarine B. Date of Service: 02/07/2022 10:15 AM Medical Record Number: [id number] Patient Account Number: [id number] Date of Birth/Sex: April 16, 1941 (81 y.o. F) Treating RN: Carlene Coria Primary Care Aala Ransom:  Early Osmond Other Clinician: Referring Ritaj Dullea: Early Osmond Treating Quenesha Douglass/Extender: Skipper Cliche in Treatment: 10 Vital Signs Height(in): 60 Pulse(bpm): 76 Weight(lbs): 100 Blood Pressure(mmHg): 125/57 Body Mass Index(BMI): 19.5 Temperature(F): 98.2 Respiratory Rate(breaths/min): 18 Photos: [N/A:N/A] Wound Location: Left Lower Leg N/A N/A Wounding Event: Trauma N/A N/A Primary Etiology: Atypical N/A N/A Comorbid History: Hypertension N/A N/A Date Acquired: 05/29/2021 N/A N/A Weeks of Treatment: 10 N/A N/A Wound Status: Open N/A N/A Wound Recurrence: No  N/A N/A Measurements L x W x D (cm) 0.5x0.5x0.1 N/A N/A Area (cm) : 0.196 N/A N/A Volume (cm) : 0.02 N/A N/A % Reduction in Area: 83.80% N/A N/A % Reduction in Volume: 83.50% N/A N/A Classification: Full Thickness Without Exposed N/A N/A Support Structures Exudate Amount: Medium N/A N/A Exudate Type: Serosanguineous N/A N/A Exudate Color: red, brown N/A N/A Granulation Amount: Large (67-100%) N/A N/A Granulation Quality: Red, Pink N/A N/A Necrotic Amount: Small (1-33%) N/A N/A Exposed Structures: Fat Layer (Subcutaneous Tissue): N/A N/A Yes Fascia: No Tendon: No Muscle: No Joint: No Bone: No Epithelialization: None N/A N/A Treatment Notes Electronic Signature(s) Signed: 02/08/2022 2:49:16 PM By: Carlene Coria RN Entered By: Carlene Coria on 02/07/2022 10:25:39 Jessica [name] ([id number]) -------------------------------------------------------------------------------- [place] Details Patient Name: Jessica Balzarine B. Date of Service: 02/07/2022 10:15 AM Medical Record Number: [id number] Patient Account Number: [id number] Date of Birth/Sex: 02/06/1941 (81 y.o. F) Treating RN: Carlene Coria Primary Care Akasha Melena: Early Osmond Other Clinician: Referring Saretta Dahlem: Early Osmond Treating Makarios Madlock/Extender: Skipper Cliche in Treatment: 10 Active Inactive Wound/Skin  Impairment Nursing Diagnoses: Knowledge deficit related to ulceration/compromised skin integrity Goals: Patient/caregiver will verbalize understanding of skin care regimen Date Initiated: 11/25/2021 Target Resolution Date: 01/25/2022 Goal Status: Active Ulcer/skin breakdown will have a volume reduction of 30% by week 4 Date Initiated: 11/25/2021 Date Inactivated: 01/10/2022 Target Resolution Date: 12/26/2021 Goal Status: Unmet Unmet Reason: comobities Ulcer/skin breakdown will have a volume reduction of 50% by week 8 Date Initiated: 11/25/2021 Target Resolution Date: 01/25/2022 Goal Status: Active Ulcer/skin breakdown will have a volume reduction of 80% by week 12 Date Initiated: 11/25/2021 Target Resolution Date: 02/25/2022 Goal Status: Active Ulcer/skin breakdown will heal within 14 weeks Date Initiated: 11/25/2021 Target Resolution Date: 03/27/2022 Goal Status: Active Interventions: Assess patient/caregiver ability to obtain necessary supplies Assess patient/caregiver ability to perform ulcer/skin care regimen upon admission and as needed Assess ulceration(s) every visit Notes: Electronic Signature(s) Signed: 02/08/2022 2:49:16 PM By: Carlene Coria RN Entered By: Carlene Coria on 02/07/2022 10:25:10 Jessica [name] ([id number]) -------------------------------------------------------------------------------- Pain Assessment Details Patient Name: Jessica Balzarine B. Date of Service: 02/07/2022 10:15 AM Medical Record Number: [id number] Patient Account Number: [id number] Date of Birth/Sex: 01/31/1941 (81 y.o. F) Treating RN: Carlene Coria Primary Care Mickie Kozikowski: Early Osmond Other Clinician: Referring Izick Gasbarro: Early Osmond Treating Stephenson Cichy/Extender: Skipper Cliche in Treatment: 10 Active Problems Location of Pain Severity and Description of Pain Patient Has Paino No Site Locations Pain Management and Medication Current Pain Management: Electronic Signature(s) Signed:  02/08/2022 2:49:16 PM By: Carlene Coria RN Entered By: Carlene Coria on 02/07/2022 10:20:03 Jessica [name] ([id number]) -------------------------------------------------------------------------------- Patient/Caregiver Education Details Patient Name: Jessica Balzarine B. Date of Service: 02/07/2022 10:15 AM Medical Record Number: [id number] Patient Account Number: [id number] Date of Birth/Gender: 1941-03-03 (82 y.o. F) Treating RN: Carlene Coria Primary Care Physician: Early Osmond Other Clinician: Referring Physician: Early Osmond Treating Physician/Extender: Skipper Cliche in Treatment: 10 Education Assessment Education Provided To: Patient Education Topics Provided Wound/Skin Impairment: Methods: Explain/Verbal Responses: State content correctly Electronic Signature(s) Signed: 02/08/2022 2:49:16 PM By: Carlene Coria RN Entered By: Carlene Coria on 02/07/2022 12:38:31 Jessica [name] ([id number]) -------------------------------------------------------------------------------- Wound Assessment Details Patient Name: Jessica Balzarine B. Date of Service: 02/07/2022 10:15 AM Medical Record Number: [id number] Patient Account Number: [id number] Date of Birth/Sex: Jan 11, 1941 (81 y.o. F) Treating RN: Carlene Coria Primary Care Shanese Riemenschneider: Early Osmond Other Clinician: Referring Carianna Lague: Early Osmond Treating Kyshaun Barnette/Extender: Skipper Cliche in Treatment: 10 Wound Status Wound Number: 1 Primary Etiology: Atypical  Wound Location: Left Lower Leg Wound Status: Open Wounding Event: Trauma Comorbid History: Hypertension Date Acquired: 05/29/2021 Weeks Of Treatment: 10 Clustered Wound: No Photos Wound Measurements Length: (cm) 0.5 Width: (cm) 0.5 Depth: (cm) 0.1 Area: (cm) 0.196 Volume: (cm) 0.02 % Reduction in Area: 83.8% % Reduction in Volume: 83.5% Epithelialization: None Tunneling: No Undermining: No Wound Description Classification: Full Thickness  Without Exposed Support Structures Exudate Amount: Medium Exudate Type: Serosanguineous Exudate Color: red, brown Foul Odor After Cleansing: No Slough/Fibrino Yes Wound Bed Granulation Amount: Large (67-100%) Exposed Structure Granulation Quality: Red, Pink Fascia Exposed: No Necrotic Amount: Small (1-33%) Fat Layer (Subcutaneous Tissue) Exposed: Yes Necrotic Quality: Adherent Slough Tendon Exposed: No Muscle Exposed: No Joint Exposed: No Bone Exposed: No Treatment Notes Wound #1 (Lower Leg) Wound Laterality: Left Cleanser [place] Ancillary Kit - 15 Day Supply Discharge Instruction: Use supplies as instructed; Kit contains: (15) Saline Bullets; (15) 3x3 Gauze; 15 pr Gloves Peri-Wound Care Dorko, Miyana B. ([id number]) Topical Primary Dressing Prisma 4.34 (in) Discharge Instruction: Moisten w/normal saline or sterile water; Cover wound as directed. Do not remove from wound bed. Secondary Dressing (SILICONE BORDER) Zetuvit Plus SILICONE BORDER Dressing 4x4 (in/in) Discharge Instruction: Please do not put silicone bordered dressings under wraps. Use non-bordered dressing only. Secured With Compression Wrap Compression Stockings Add-Ons Electronic Signature(s) Signed: 02/08/2022 2:49:16 PM By: Carlene Coria RN Entered By: Carlene Coria on 02/07/2022 10:23:45 Jessica [name] ([id number]) -------------------------------------------------------------------------------- Vitals Details Patient Name: Jessica Balzarine B. Date of Service: 02/07/2022 10:15 AM Medical Record Number: [id number] Patient Account Number: [id number] Date of Birth/Sex: Oct 15, 1940 (81 y.o. F) Treating RN: Carlene Coria Primary Care Jazarah Capili: Early Osmond Other Clinician: Referring Shunsuke Granzow: Early Osmond Treating Earnestene Angello/Extender: Skipper Cliche in Treatment: 10 Vital Signs Time Taken: 10:18 Temperature (F): 98.2 Height (in): 60 Pulse (bpm): 58 Weight (lbs): 100 Respiratory Rate  (breaths/min): 18 Body Mass Index (BMI): 19.5 Blood Pressure (mmHg): 125/57 Reference Range: 80 - 120 mg / dl Electronic Signature(s) Signed: 02/08/2022 2:49:16 PM By: Carlene Coria RN Entered By: Carlene Coria on 02/07/2022 10:19:56

## 2022-02-14 ENCOUNTER — Encounter: Payer: Medicare PPO | Admitting: Physician Assistant

## 2022-02-14 DIAGNOSIS — L988 Other specified disorders of the skin and subcutaneous tissue: Secondary | ICD-10-CM | POA: Diagnosis not present

## 2022-02-14 DIAGNOSIS — I10 Essential (primary) hypertension: Secondary | ICD-10-CM | POA: Diagnosis not present

## 2022-02-14 DIAGNOSIS — Z87891 Personal history of nicotine dependence: Secondary | ICD-10-CM | POA: Diagnosis not present

## 2022-02-14 DIAGNOSIS — L97822 Non-pressure chronic ulcer of other part of left lower leg with fat layer exposed: Secondary | ICD-10-CM | POA: Diagnosis not present

## 2022-02-14 NOTE — Progress Notes (Signed)
MALENA, TIMPONE ([id number]) Visit Report for 02/14/2022 Chief Complaint Document Details Patient Name: DAPHINE, LOCH B. Date of Service: 02/14/2022 10:15 AM Medical Record Number: [id number] Patient Account Number: [id number] Date of Birth/Sex: 12/29/1940 (81 y.o. F) Treating RN: Carlene Coria Primary Care Provider: Early Osmond Other Clinician: Referring Provider: Early Osmond Treating Provider/Extender: Skipper Cliche in Treatment: 11 Information Obtained from: Patient Chief Complaint Left LE Ulcer Electronic Signature(s) Signed: 02/14/2022 10:24:59 AM By: Worthy Keeler PA-C Entered By: Worthy Keeler on 02/14/2022 10:24:59 Agnes Lawrence ([id number]) -------------------------------------------------------------------------------- Problem List Details Patient Name: Drema Balzarine B. Date of Service: 02/14/2022 10:15 AM Medical Record Number: [id number] Patient Account Number: [id number] Date of Birth/Sex: 1941/05/16 (81 y.o. F) Treating RN: Carlene Coria Primary Care Provider: Early Osmond Other Clinician: Referring Provider: Early Osmond Treating Provider/Extender: Skipper Cliche in Treatment: 11 Active Problems ICD-10 Encounter Code Description Active Date MDM Diagnosis L98.8 Other specified disorders of the skin and subcutaneous tissue 11/25/2021 No Yes L97.822 Non-pressure chronic ulcer of other part of left lower leg with fat layer 11/25/2021 No Yes exposed Elaine (primary) hypertension 11/25/2021 No Yes Inactive Problems Resolved Problems Electronic Signature(s) Signed: 02/14/2022 10:24:55 AM By: Worthy Keeler PA-C Entered By: Worthy Keeler on 02/14/2022 10:24:55

## 2022-02-14 NOTE — Progress Notes (Addendum)
Jessica [name], Jessica [name] ([id number]) Visit Report for 02/14/2022 Arrival Information Details Patient Name: Jessica [name], Jessica [name]. Date of Service: 02/14/2022 10:15 AM Medical Record Number: [id number] Patient Account Number: [id number] Date of Birth/Sex: 1940-09-15 (81 y.o. F) Treating RN: Carlene Coria Primary Care Samantha Olivera: Early Osmond Other Clinician: Referring [place] Lesure: Early Osmond Treating Milind Raether/Extender: Skipper Cliche in Treatment: 11 Visit Information History Since Last Visit All ordered tests and consults were completed: No Patient Arrived: Ambulatory Added or deleted any medications: No Arrival Time: 10:38 Any new allergies or adverse reactions: No Accompanied By: self Had a fall or experienced change in No Transfer Assistance: None activities of daily living that may affect Patient Identification Verified: Yes risk of falls: Secondary Verification Process Completed: Yes Signs or symptoms of abuse/neglect since last visito No Patient Requires Transmission-Based No Hospitalized since last visit: No Precautions: Implantable device outside of the clinic excluding No Patient Has Alerts: Yes cellular tissue based products placed in the center Patient Alerts: ABI L1.1 TBI .72 3-20- since last visit: 23 Has Dressing in Place as Prescribed: Yes ABI R 1.4 TBI .72 3-20- Pain Present Now: No 23 Electronic Signature(s) Signed: 02/16/2022 2:40:31 PM By: Carlene Coria RN Entered By: Carlene Coria on 02/14/2022 10:43:28 Jessica [name] ([id number]) -------------------------------------------------------------------------------- Clinic Level of Care Assessment Details Patient Name: Jessica Balzarine B. Date of Service: 02/14/2022 10:15 AM Medical Record Number: [id number] Patient Account Number: [id number] Date of Birth/Sex: 1941/07/04 (81 y.o. F) Treating RN: Carlene Coria Primary Care Kurstyn Larios: Early Osmond Other Clinician: Referring Jsean Taussig: Early Osmond Treating Magdelyn Roebuck/Extender: Skipper Cliche in Treatment: 11 Clinic Level of Care Assessment Items TOOL 4 Quantity Score X - Use when only an EandM is performed on FOLLOW-UP visit 1 0 ASSESSMENTS - Nursing Assessment / Reassessment X - Reassessment of Co-morbidities (includes updates in patient status) 1 10 X- 1 5 Reassessment of Adherence to Treatment Plan ASSESSMENTS - Wound and Skin Assessment / Reassessment X - Simple Wound Assessment / Reassessment - one wound 1 5 '[]'$  - 0 Complex Wound Assessment / Reassessment - multiple wounds '[]'$  - 0 Dermatologic / Skin Assessment (not related to wound area) ASSESSMENTS - Focused Assessment '[]'$  - Circumferential Edema Measurements - multi extremities 0 '[]'$  - 0 Nutritional Assessment / Counseling / Intervention '[]'$  - 0 Lower Extremity Assessment (monofilament, tuning fork, pulses) '[]'$  - 0 Peripheral Arterial Disease Assessment (using hand held doppler) ASSESSMENTS - Ostomy and/or Continence Assessment and Care '[]'$  - Incontinence Assessment and Management 0 '[]'$  - 0 Ostomy Care Assessment and Management (repouching, etc.) PROCESS - Coordination of Care X - Simple Patient / Family Education for ongoing care 1 15 '[]'$  - 0 Complex (extensive) Patient / Family Education for ongoing care '[]'$  - 0 Staff obtains Programmer, systems, Records, Test Results / Process Orders '[]'$  - 0 Staff telephones HHA, Nursing Homes / Clarify orders / etc '[]'$  - 0 Routine Transfer to another Facility (non-emergent condition) '[]'$  - 0 Routine Hospital Admission (non-emergent condition) '[]'$  - 0 New Admissions / Biomedical engineer / Ordering NPWT, Apligraf, etc. '[]'$  - 0 Emergency Hospital Admission (emergent condition) X- 1 10 Simple Discharge Coordination '[]'$  - 0 Complex (extensive) Discharge Coordination PROCESS - Special Needs '[]'$  - Pediatric / Minor Patient Management 0 '[]'$  - 0 Isolation Patient Management '[]'$  - 0 Hearing / Language / Visual special needs '[]'$  -  0 Assessment of Community assistance (transportation, D/C planning, etc.) '[]'$  - 0 Additional assistance / Altered mentation '[]'$  - 0 Support Surface(s) Assessment (bed, cushion, seat, etc.) INTERVENTIONS -  Wound Cleansing / Measurement Jessica [name], Jessica B. ([id number]) X- 1 5 Simple Wound Cleansing - one wound '[]'$  - 0 Complex Wound Cleansing - multiple wounds X- 1 5 Wound Imaging (photographs - any number of wounds) '[]'$  - 0 Wound Tracing (instead of photographs) X- 1 5 Simple Wound Measurement - one wound '[]'$  - 0 Complex Wound Measurement - multiple wounds INTERVENTIONS - Wound Dressings '[]'$  - Small Wound Dressing one or multiple wounds 0 '[]'$  - 0 Medium Wound Dressing one or multiple wounds '[]'$  - 0 Large Wound Dressing one or multiple wounds '[]'$  - 0 Application of Medications - topical '[]'$  - 0 Application of Medications - injection INTERVENTIONS - Miscellaneous '[]'$  - External ear exam 0 '[]'$  - 0 Specimen Collection (cultures, biopsies, blood, body fluids, etc.) '[]'$  - 0 Specimen(s) / Culture(s) sent or taken to Lab for analysis '[]'$  - 0 Patient Transfer (multiple staff / Civil Service fast streamer / Similar devices) '[]'$  - 0 Simple Staple / Suture removal (25 or less) '[]'$  - 0 Complex Staple / Suture removal (26 or more) '[]'$  - 0 Hypo / Hyperglycemic Management (close monitor of Blood Glucose) '[]'$  - 0 Ankle / Brachial Index (ABI) - do not check if billed separately X- 1 5 Vital Signs Has the patient been seen at the hospital within the last three years: Yes Total Score: 65 Level Of Care: New/Established - Level 2 Electronic Signature(s) Signed: 02/16/2022 2:40:31 PM By: Carlene Coria RN Entered By: Carlene Coria on 02/14/2022 12:35:22 Jessica [name] ([id number]) -------------------------------------------------------------------------------- Encounter Discharge Information Details Patient Name: Jessica Balzarine B. Date of Service: 02/14/2022 10:15 AM Medical Record Number: [id number] Patient  Account Number: [id number] Date of Birth/Sex: 04-Mar-1941 (81 y.o. F) Treating RN: Carlene Coria Primary Care Zorian Gunderman: Early Osmond Other Clinician: Referring Sarahanne Novakowski: Early Osmond Treating Townes Fuhs/Extender: Skipper Cliche in Treatment: 11 Encounter Discharge Information Items Discharge Condition: Stable Ambulatory Status: Ambulatory Discharge Destination: Home Transportation: Private Auto Accompanied By: self Schedule Follow-up Appointment: Yes Clinical Summary of Care: Electronic Signature(s) Signed: 02/16/2022 2:40:31 PM By: Carlene Coria RN Entered By: Carlene Coria on 02/14/2022 11:15:19 Jessica [name] ([id number]) -------------------------------------------------------------------------------- Lower Extremity Assessment Details Patient Name: Jessica Balzarine B. Date of Service: 02/14/2022 10:15 AM Medical Record Number: [id number] Patient Account Number: [id number] Date of Birth/Sex: 09/25/1940 (81 y.o. F) Treating RN: Carlene Coria Primary Care Cristi Gwynn: Early Osmond Other Clinician: Referring Renzo Vincelette: Early Osmond Treating Deontae Robson/Extender: Skipper Cliche in Treatment: 11 Edema Assessment Assessed: [Left: Yes] [Right: No] Edema: [Left: N] [Right: o] Calf Left: Right: Point of Measurement: 32 cm From Medial Instep 31 cm Ankle Left: Right: Point of Measurement: 10 cm From Medial Instep 17 cm Vascular Assessment Pulses: Dorsalis Pedis Palpable: [Left:Yes] Electronic Signature(s) Signed: 02/16/2022 2:40:31 PM By: Carlene Coria RN Entered By: Carlene Coria on 02/14/2022 10:48:12 Jessica [name] ([id number]) -------------------------------------------------------------------------------- Multi Wound Chart Details Patient Name: Jessica Balzarine B. Date of Service: 02/14/2022 10:15 AM Medical Record Number: [id number] Patient Account Number: [id number] Date of Birth/Sex: August 19, 1941 (81 y.o. F) Treating RN: Carlene Coria Primary Care Katina Remick:  Early Osmond Other Clinician: Referring Deztiny Sarra: Early Osmond Treating Kaicen Desena/Extender: Skipper Cliche in Treatment: 11 Vital Signs Height(in): 60 Pulse(bpm): 1 Weight(lbs): 100 Blood Pressure(mmHg): 125/57 Body Mass Index(BMI): 19.5 Temperature(F): 98.2 Respiratory Rate(breaths/min): 18 Photos: [1:No Photos] [N/A:N/A] Wound Location: [1:Left Lower Leg] [N/A:N/A] Wounding Event: [1:Trauma] [N/A:N/A] Primary Etiology: [1:Atypical] [N/A:N/A] Comorbid History: [1:Hypertension] [N/A:N/A] Date Acquired: [1:05/29/2021] [N/A:N/A] Weeks of Treatment: [1:11] [N/A:N/A] Wound Status: [1:Healed - Epithelialized] [N/A:N/A] Wound Recurrence: [1:No] [N/A:N/A] Measurements L x  W x D (cm) [1:0x0x0] [N/A:N/A] Area (cm) : [1:0] [N/A:N/A] Volume (cm) : [1:0] [N/A:N/A] % Reduction in Area: [1:100.00%] [N/A:N/A] % Reduction in Volume: [1:100.00%] [N/A:N/A] Classification: [1:Full Thickness Without Exposed Support Structures] [N/A:N/A] Exudate Amount: [1:None Present] [N/A:N/A] Granulation Amount: [1:None Present (0%)] [N/A:N/A] Necrotic Amount: [1:None Present (0%)] [N/A:N/A] Exposed Structures: [1:Fascia: No Fat Layer (Subcutaneous Tissue): No Tendon: No Muscle: No Joint: No Bone: No Large (67-100%)] [N/A:N/A N/A] Treatment Notes Wound #1 (Lower Leg) Wound Laterality: Left Cleanser Peri-Wound Care Topical Primary Dressing Secondary Dressing Secured With Compression Wrap Compression Stockings Add-Ons Jessica [name], Jessica [name] ([id number]) Electronic Signature(s) Signed: 02/14/2022 12:35:04 PM By: Carlene Coria RN Entered By: Carlene Coria on 02/14/2022 12:35:04 Jessica [name] ([id number]) -------------------------------------------------------------------------------- [place] Details Patient Name: Jessica Balzarine B. Date of Service: 02/14/2022 10:15 AM Medical Record Number: [id number] Patient Account Number: [id number] Date of Birth/Sex: 07/26/1941 (81  y.o. F) Treating RN: Carlene Coria Primary Care Elvyn Krohn: Early Osmond Other Clinician: Referring Johncharles Fusselman: Early Osmond Treating Odena Mcquaid/Extender: Skipper Cliche in Treatment: 11 Active Inactive Electronic Signature(s) Signed: 02/14/2022 12:34:54 PM By: Carlene Coria RN Entered By: Carlene Coria on 02/14/2022 12:34:54 Jessica [name] ([id number]) -------------------------------------------------------------------------------- Pain Assessment Details Patient Name: Jessica Balzarine B. Date of Service: 02/14/2022 10:15 AM Medical Record Number: [id number] Patient Account Number: [id number] Date of Birth/Sex: 1940-09-20 (81 y.o. F) Treating RN: Carlene Coria Primary Care Talal Fritchman: Early Osmond Other Clinician: Referring Houa Ackert: Early Osmond Treating Meloni Hinz/Extender: Skipper Cliche in Treatment: 11 Active Problems Location of Pain Severity and Description of Pain Patient Has Paino No Site Locations Pain Management and Medication Current Pain Management: Electronic Signature(s) Signed: 02/16/2022 2:40:31 PM By: Carlene Coria RN Entered By: Carlene Coria on 02/14/2022 10:44:11 Jessica [name] ([id number]) -------------------------------------------------------------------------------- Patient/Caregiver Education Details Patient Name: Jessica Balzarine B. Date of Service: 02/14/2022 10:15 AM Medical Record Number: [id number] Patient Account Number: [id number] Date of Birth/Gender: Jan 22, 1941 (81 y.o. F) Treating RN: Carlene Coria Primary Care Physician: Early Osmond Other Clinician: Referring Physician: Early Osmond Treating Physician/Extender: Skipper Cliche in Treatment: 11 Education Assessment Education Provided To: Patient Education Topics Provided Wound/Skin Impairment: Methods: Explain/Verbal Responses: State content correctly Electronic Signature(s) Signed: 02/16/2022 2:40:31 PM By: Carlene Coria RN Entered By: Carlene Coria on 02/14/2022  12:35:32 Jessica [name] ([id number]) -------------------------------------------------------------------------------- Wound Assessment Details Patient Name: Jessica Balzarine B. Date of Service: 02/14/2022 10:15 AM Medical Record Number: [id number] Patient Account Number: [id number] Date of Birth/Sex: 05-26-1941 (81 y.o. F) Treating RN: Carlene Coria Primary Care Ovide Dusek: Early Osmond Other Clinician: Referring Effie Wahlert: Early Osmond Treating Jahzeel Poythress/Extender: Skipper Cliche in Treatment: 11 Wound Status Wound Number: 1 Primary Etiology: Atypical Wound Location: Left Lower Leg Wound Status: Healed - Epithelialized Wounding Event: Trauma Comorbid History: Hypertension Date Acquired: 05/29/2021 Weeks Of Treatment: 11 Clustered Wound: No Wound Measurements Length: (cm) 0 Width: (cm) 0 Depth: (cm) 0 Area: (cm) 0 Volume: (cm) 0 % Reduction in Area: 100% % Reduction in Volume: 100% Epithelialization: Large (67-100%) Tunneling: No Undermining: No Wound Description Classification: Full Thickness Without Exposed Support Structure Exudate Amount: None Present s Foul Odor After Cleansing: No Slough/Fibrino No Wound Bed Granulation Amount: None Present (0%) Exposed Structure Necrotic Amount: None Present (0%) Fascia Exposed: No Fat Layer (Subcutaneous Tissue) Exposed: No Tendon Exposed: No Muscle Exposed: No Joint Exposed: No Bone Exposed: No Treatment Notes Wound #1 (Lower Leg) Wound Laterality: Left Cleanser Peri-Wound Care Topical Primary Dressing Secondary Dressing Secured With Compression Wrap Compression Stockings Add-Ons Electronic Signature(s) Signed: 02/16/2022 2:40:31 PM By: Carlene Coria  RN Entered By: Carlene Coria on 02/14/2022 11:09:40 Jessica [name] ([id number]) -------------------------------------------------------------------------------- Vitals Details Patient Name: Jessica Balzarine B. Date of Service: 02/14/2022 10:15  AM Medical Record Number: [id number] Patient Account Number: [id number] Date of Birth/Sex: 1941-03-24 (81 y.o. F) Treating RN: Carlene Coria Primary Care Anastasio Wogan: Early Osmond Other Clinician: Referring Zainab Crumrine: Early Osmond Treating Raylin Winer/Extender: Skipper Cliche in Treatment: 11 Vital Signs Time Taken: 10:43 Temperature (F): 98.2 Height (in): 60 Pulse (bpm): 58 Weight (lbs): 100 Respiratory Rate (breaths/min): 18 Body Mass Index (BMI): 19.5 Blood Pressure (mmHg): 125/57 Reference Range: 80 - 120 mg / dl Electronic Signature(s) Signed: 02/16/2022 2:40:31 PM By: Carlene Coria RN Entered By: Carlene Coria on 02/14/2022 10:43:55

## 2022-02-15 DIAGNOSIS — H5340 Unspecified visual field defects: Secondary | ICD-10-CM | POA: Diagnosis not present

## 2022-02-15 DIAGNOSIS — H401212 Low-tension glaucoma, right eye, moderate stage: Secondary | ICD-10-CM | POA: Diagnosis not present

## 2022-02-21 ENCOUNTER — Encounter: Payer: Medicare PPO | Admitting: Physician Assistant

## 2022-05-28 DIAGNOSIS — Z23 Encounter for immunization: Secondary | ICD-10-CM | POA: Diagnosis not present

## 2022-06-02 DIAGNOSIS — H6120 Impacted cerumen, unspecified ear: Secondary | ICD-10-CM | POA: Diagnosis not present

## 2022-06-30 ENCOUNTER — Ambulatory Visit: Payer: Medicare PPO | Admitting: Sports Medicine

## 2022-06-30 VITALS — BP 150/88 | Ht <= 58 in | Wt 104.0 lb

## 2022-06-30 DIAGNOSIS — R269 Unspecified abnormalities of gait and mobility: Secondary | ICD-10-CM

## 2022-06-30 DIAGNOSIS — G8929 Other chronic pain: Secondary | ICD-10-CM

## 2022-06-30 DIAGNOSIS — M25552 Pain in left hip: Secondary | ICD-10-CM

## 2022-06-30 NOTE — Assessment & Plan Note (Signed)
We will try a month of correction of gait with lift HEP If not improved Hip XR on RTC

## 2022-06-30 NOTE — Assessment & Plan Note (Signed)
Still with trendelenburg shift to left I believe this is jamming left hip and may be causing some OA  We will try using HEP to strengthen hip abduction and see if this lessens her sxs

## 2022-06-30 NOTE — Progress Notes (Signed)
  Jessica [name] - 81 y.o. female MRN [id number]  Date of birth: [birth year]    CHIEF COMPLAINT:   Left hip pain    SUBJECTIVE:   HPI:  Really pleasant 81 year old female with history of scoliosis comes to clinic to be evaluated for left lateral hip pain.  Has been present for about 5 weeks.  Denies any 1 inciting injury or trauma.  She reports a sharp shooting pain over the lateral and front part of the hip.  It is worse with certain movements, such as getting out of bed or with walking her dog.  She walks 2 and half miles with her dog each day.  She has not tried taking any medicines or any stretches.  ROS:     See HPI  PERTINENT  PMH / PSH FH / / SH:  Past Medical, Surgical, Social, and Family History Reviewed & Updated in the EMR.  Pertinent findings include:  Scoliosis  OBJECTIVE: BP (!) 150/88   Ht '4\' 8"'$  (1.422 m)   Wt 104 lb (47.2 kg)   BMI 23.32 kg/m   Physical Exam:  Vital signs are reviewed. BP was repeated.  GEN: Alert and oriented, NAD Pulm: Breathing unlabored PSY: normal mood, congruent affect  MSK: Left hip -no obvious deformity.  She is nontender over the iliac crest.  She is tender to palpation along the quad muscles anteriorly, tensor fascia lata, the greater trochanter, and IT band distally.  Full range of motion in flexion extension, external rotation, but some limitations in internal rotation.  She has 5/5 strength in hip flexion, 4/5 strength in hip abduction.  4/5 strength with hamstring flexion. she has a negative logroll test.  She has a positive FADIR test, a negative Faber test.  Negative Stinchfield.  Negative straight leg raise.  Neurovascularly intact distally  Gait analysis : Scoliosis that results in Trendelenburg gait on the left with a hard foot strike  ASSESSMENT & PLAN:  1.  Left hip pain -I think the etiology of her symptoms is multi factorial.  I think she has scoliosis that is led to an imbalanced loading of her hips, leading to the  development of some left hip osteoarthritis.  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  We will treat this conservatively with IT band stretches, quad strengthening, and hamstring strengthening with focus on lateral movement, as well as single-leg balance work.   She was also given a lateral heel wedge in the left shoe and her gait was analyzed afterwards and it looked more natural with a more even strike and correction of the Trendelenburg gait.  I will have her follow up in 4 weeks to check her progress.  If no improvement, I would get an x-ray of that hip to evaluate how much OA she has there.  Dortha Kern, MD PGY-4, Sports Medicine Fellow [place]  I observed and examined the patient with the [facility] resident and agree with assessment and plan.  Note reviewed and modified by me. Ila Mcgill, MD

## 2022-07-28 ENCOUNTER — Other Ambulatory Visit: Payer: Medicare PPO | Admitting: Sports Medicine

## 2022-08-02 DIAGNOSIS — E038 Other specified hypothyroidism: Secondary | ICD-10-CM | POA: Diagnosis not present

## 2022-08-02 DIAGNOSIS — E039 Hypothyroidism, unspecified: Secondary | ICD-10-CM | POA: Diagnosis not present

## 2022-08-02 DIAGNOSIS — Z0000 Encounter for general adult medical examination without abnormal findings: Secondary | ICD-10-CM | POA: Diagnosis not present

## 2022-08-02 DIAGNOSIS — I10 Essential (primary) hypertension: Secondary | ICD-10-CM | POA: Diagnosis not present

## 2022-08-02 DIAGNOSIS — I2510 Atherosclerotic heart disease of native coronary artery without angina pectoris: Secondary | ICD-10-CM | POA: Diagnosis not present

## 2022-08-02 DIAGNOSIS — R7303 Prediabetes: Secondary | ICD-10-CM | POA: Diagnosis not present

## 2022-08-02 DIAGNOSIS — H4089 Other specified glaucoma: Secondary | ICD-10-CM | POA: Diagnosis not present

## 2022-08-02 DIAGNOSIS — E782 Mixed hyperlipidemia: Secondary | ICD-10-CM | POA: Diagnosis not present

## 2022-08-02 DIAGNOSIS — Z8673 Personal history of transient ischemic attack (TIA), and cerebral infarction without residual deficits: Secondary | ICD-10-CM | POA: Diagnosis not present

## 2022-08-03 DIAGNOSIS — Z0000 Encounter for general adult medical examination without abnormal findings: Secondary | ICD-10-CM | POA: Diagnosis not present

## 2022-08-03 DIAGNOSIS — E782 Mixed hyperlipidemia: Secondary | ICD-10-CM | POA: Diagnosis not present

## 2022-08-03 DIAGNOSIS — R7303 Prediabetes: Secondary | ICD-10-CM | POA: Diagnosis not present

## 2022-08-03 DIAGNOSIS — I10 Essential (primary) hypertension: Secondary | ICD-10-CM | POA: Diagnosis not present

## 2022-08-03 DIAGNOSIS — E039 Hypothyroidism, unspecified: Secondary | ICD-10-CM | POA: Diagnosis not present

## 2022-08-04 ENCOUNTER — Ambulatory Visit (INDEPENDENT_AMBULATORY_CARE_PROVIDER_SITE_OTHER): Payer: Medicare PPO | Admitting: Sports Medicine

## 2022-08-04 ENCOUNTER — Ambulatory Visit
Admission: RE | Admit: 2022-08-04 | Discharge: 2022-08-04 | Disposition: A | Discharge status: Home / Self Care | Payer: Medicare PPO | Source: Ambulatory Visit | Attending: Sports Medicine | Admitting: Sports Medicine

## 2022-08-04 VITALS — BP 118/70 | Ht <= 58 in | Wt 104.0 lb

## 2022-08-04 DIAGNOSIS — M25552 Pain in left hip: Secondary | ICD-10-CM

## 2022-08-04 DIAGNOSIS — G8929 Other chronic pain: Secondary | ICD-10-CM

## 2022-08-04 NOTE — Progress Notes (Signed)
Chief complaint left hip and thigh pain  Patient has significant pain of her left thigh and some in the groin She has been a very active person over many years She has significant scoliosis which gives her unequal leg lengths for gait On last visit we added an insert on the left to help to correct some of this She feels that was really pretty helpful We gave her hip abduction exercises and some generalized leg strengthening exercises She definitely feels stronger However with activities and with periodic walking she gets sharp pain Sometimes a sharp pain down her anterior thigh will go all the way to the knee It occasionally occurs at night and wakens her  Her daughter who is a physician confirmed that the patient tends to minimize her symptoms but this has bothered her considerably  Physical exam is a pleasant petite female in no acute distress BP 118/70   Ht '4\' 10"'$  (1.473 m)   Wt 104 lb (47.2 kg)   BMI 21.74 kg/m   Examination of her hip flexors hip abductors and quadriceps of the left leg reveal she is significantly improved her strength Internal rotation and external rotation of the left hip are significantly limited Hip motion is much improved on the right hip Hip flexion on the right is full Hip flexion on the left causes some pain And walking she has a shift of her pelvis related to her scoliosis The left side does not reveal as much motion With the leg length correction she does not have much of a Trendelenburg  Impression suspect that this is advanced hip osteoarthritis I think the hip OA probably radiates down the thigh into the knee  Plan we will proceed with x-rays of her left hip Continue to use her lift for the left leg Continue to use periodic Advil if she needs it for pain but she seems to rarely use any medicine She does take an aspirin at night because of a previous heart attack and she says that actually helps with the pain Pending results of her x-rays we  will come up with a plan to try to help her relieve the pain in her hip or else we will do further diagnostic evaluation  Addendum 08/05/22 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

## 2022-08-05 NOTE — Assessment & Plan Note (Addendum)
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  In any case she has at least grade 4 osteoarthritic changes and would need a hip replacement for pain relief if she feels that she can do this  I spoke with the patient and advised her of these changes.  She is willing to see the surgeon for his opinion.  I will also try to communicate with her daughter who is a family physician.

## 2022-08-08 ENCOUNTER — Encounter: Payer: Self-pay | Admitting: Orthopaedic Surgery

## 2022-08-08 ENCOUNTER — Other Ambulatory Visit: Payer: Self-pay

## 2022-08-08 ENCOUNTER — Ambulatory Visit (INDEPENDENT_AMBULATORY_CARE_PROVIDER_SITE_OTHER): Payer: Medicare PPO

## 2022-08-08 ENCOUNTER — Ambulatory Visit (INDEPENDENT_AMBULATORY_CARE_PROVIDER_SITE_OTHER): Payer: Medicare PPO | Admitting: Orthopaedic Surgery

## 2022-08-08 DIAGNOSIS — M25552 Pain in left hip: Secondary | ICD-10-CM

## 2022-08-08 DIAGNOSIS — M1612 Unilateral primary osteoarthritis, left hip: Secondary | ICD-10-CM | POA: Insufficient documentation

## 2022-08-08 NOTE — Progress Notes (Signed)
The patient is a pleasant 81 year old female sent from Dr. 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  She is a thin individual.  She reports some right hip stiffness but also scoliosis.  She does have a female with a cane and said her pain is worsening with her left hip.  She is not a diabetic.  She is not on blood thinning medication.  She is a thin and petite individual.  I was able to review all of her notes within epic including her past medical history, medication history and social history.  Her left hip pain is daily and at this point it is detrimentally affecting her mobility, her quality of life and actives daily living.  She was sent to me to consider her for left hip replacement surgery.  Her daughter is a Engineer, drilling in [place].  She currently denies any headache, chest pain, shortness of breath, fever, chills, nausea, vomiting  She is alert and oriented x 3 and in no acute distress  Examination of her left hip shows essentially almost no internal and external rotation with significant stiffness.  Her right hip has better motion but is still quite stiff as well.    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  There is complete loss of joint space and shortening of that side.  There is osteophytes all around both hips with joint space narrowing on the right side but is very severe on the left side.  I showed her hip replacement model and explained in detail what the surgery involves.  We talked about the intraoperative and postoperative course and what to expect with surgery.  We discussed the risks and benefits of surgery in detail.  All questions and concerns were answered and addressed.  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  We  will be in touch with scheduling her for a left hip replacement.

## 2022-08-18 DIAGNOSIS — H401231 Low-tension glaucoma, bilateral, mild stage: Secondary | ICD-10-CM | POA: Diagnosis not present

## 2022-08-18 DIAGNOSIS — H353131 Nonexudative age-related macular degeneration, bilateral, early dry stage: Secondary | ICD-10-CM | POA: Diagnosis not present

## 2022-08-18 DIAGNOSIS — H532 Diplopia: Secondary | ICD-10-CM | POA: Diagnosis not present

## 2022-08-24 ENCOUNTER — Telehealth: Payer: Self-pay | Admitting: Orthopedic Surgery

## 2022-08-24 NOTE — Telephone Encounter (Signed)
Ms. Jessica [name] called in to inquire about getting her surgery moved up sooner.  She states that she spent time with family over the holidays who are physicians and they advised her that she did not need to wait on having surgery.  She is afraid of falling and breaking her hip, so therefore she is not showering or doing anything that could potentially make her fall. I advised Jessica [name] that I would forward this message to Dr. Trevor Mace surgery scheduler so that we could possible move her up if there were a cancellation.  She again states that she cannot wait long for surgery.

## 2022-09-02 ENCOUNTER — Other Ambulatory Visit: Payer: Self-pay

## 2022-09-05 ENCOUNTER — Other Ambulatory Visit: Payer: Self-pay | Admitting: Physician Assistant

## 2022-09-05 DIAGNOSIS — Z01818 Encounter for other preprocedural examination: Secondary | ICD-10-CM

## 2022-09-06 NOTE — Progress Notes (Signed)
COVID Vaccine received:  _0  No _1  Yes Date of any COVID positive Test in last 90 days:  None  PCP - Early Osmond, MD Cardiologist - not presently, Daughter is a MD in [place], [place], Daughter is POA.   Chest x-ray -  EKG -  08-20-2020  CE  requested for comparison.   Will repeat EKG at PST Stress Test -  ECHO - ?  Possibly done at [facility] 20 years ago, Hx MI, has Stents done by [facility], patient can't remember who  PCR screen: _2  Ordered & Completed                      _3   No Order but Needs PROFEND                      _4   N/A for this surgery  Surgery Plan:  _5  Ambulatory                            _6  Outpatient in bed                            _7  Admit  Anesthesia:    _8  General  _9  Spinal                           _10   Choice _11   MAC  Pacemaker / ICD device _12  No _13  Yes        Device order form faxed _14  No    _15   Yes      Faxed to:  Spinal Cord Stimulator:_16  No _17  Yes      (Remind patient to bring remote DOS) Other Implants:   History of Sleep Apnea? _18  No _19  Yes   CPAP used?- _20  No _21  Yes    Does the patient monitor blood sugar? _22  No _23  Yes  _24  N/A  Blood Thinner / Instructions: none Aspirin Instructions: ASA 325 mg stopping on: last dose Saturday 09-10-22.  ERAS Protocol Ordered: _25  No  _26  Yes PRE-SURGERY _27  ENSURE  _28  G2  Patient is to be NPO after: 09:45 am  Comments: patient's daughter is a primary care Physician in [place], [place]  at [employer]  Activity level: Patient can not climb a flight of stairs without difficulty; _29  No CP  _30  No SOB, but would have _severe leg pain.   Patient can perform ADLs without assistance.   Anesthesia review: Hx TIA, HTN,Cath- Stent for CAD- done approx. 20 years ago at [facility] in [place],   Patient denies shortness of breath, fever, cough and chest pain at PAT appointment.  Patient verbalized understanding and agreement to the Pre-Surgical Instructions that were given  to them at this PAT appointment. Patient was also educated of the need to review these PAT instructions again prior to his/her surgery.I reviewed the appropriate phone numbers to call if they have any and questions or concerns.

## 2022-09-06 NOTE — Patient Instructions (Signed)
SURGICAL WAITING ROOM VISITATION Patients having surgery or a procedure may have no more than 2 support people in the waiting area - these visitors may rotate in the visitor waiting room.   Due to an increase in RSV and influenza rates and associated hospitalizations, children ages 61 and under may not visit patients in [place]. If the patient needs to stay at the hospital during part of their recovery, the visitor guidelines for inpatient rooms apply.  PRE-OP VISITATION  Pre-op nurse will coordinate an appropriate time for 1 support person to accompany the patient in pre-op.  This support person may not rotate.  This visitor will be contacted when the time is appropriate for the visitor to come back in the pre-op area.  Please refer to the [facility] website for the visitor guidelines for Inpatients (after your surgery is over and you are in a regular room).  You are not required to quarantine at this time prior to your surgery. However, you must do this: Hand Hygiene often Do NOT share personal items Notify your provider if you are in close contact with someone who has COVID or you develop fever 100.4 or greater, new onset of sneezing, cough, sore throat, shortness of breath or body aches.  If you test positive for Covid or have been in contact with anyone that has tested positive in the last 10 days please notify you surgeon.    Your procedure is scheduled on:  Friday  September 16, 2022  Report to [facility] Main Entrance: [place] entrance where the [employer] is available.   Report to admitting at: 10:15 am  +++++Call this number if you have any questions or problems the morning of surgery [phone]  Do not eat food after Midnight the night prior to your surgery/procedure.  After Midnight you may have the following liquids until  09:45 AM DAY OF SURGERY  Clear Liquid Diet Water Black Coffee (sugar ok, NO MILK/CREAM OR CREAMERS)  Tea (sugar ok, NO  MILK/CREAM OR CREAMERS) regular and decaf                             Plain Jell-O  with no fruit (NO RED)                                           Fruit ices (not with fruit pulp, NO RED)                                     Popsicles (NO RED)                                                                  Juice: apple, WHITE grape, WHITE cranberry Sports drinks like Gatorade or Powerade (NO RED)                    The day of surgery:  Drink ONE (1) Pre-Surgery Clear Ensure at  09:45 AM the morning of surgery. Drink in one sitting. Do not sip.  This drink was given to you during your hospital pre-op appointment visit. Nothing else to drink after completing the Pre-Surgery Clear Ensure  No candy, chewing gum or throat lozenges.    FOLLOW  ANY ADDITIONAL PRE OP INSTRUCTIONS YOU RECEIVED FROM YOUR SURGEON'S OFFICE!!!   Oral Hygiene is also important to reduce your risk of infection.        Remember - BRUSH YOUR TEETH THE MORNING OF SURGERY WITH YOUR REGULAR TOOTHPASTE  Take ONLY these medicines the morning of surgery with A SIP OF WATER: Levothyroxine (Synthroid)                    You may not have any metal on your body including hair pins, jewelry, and body piercing  Do not wear make-up, lotions, powders, perfumes or deodorant  Do not wear nail polish including gel and S&S, artificial / acrylic nails, or any other type of covering on natural nails including finger and toenails. If you have artificial nails, gel coating, etc., that needs to be removed by a nail salon, Please have this removed prior to surgery. Not doing so may mean that your surgery could be cancelled or delayed if the Surgeon or anesthesia staff feels like they are unable to monitor you safely.   Do not shave 48 hours prior to surgery to avoid nicks in your skin which may contribute to postoperative infections.   Contacts, Hearing Aids, dentures or bridgework may not be worn into surgery. DENTURES WILL BE REMOVED  PRIOR TO SURGERY PLEASE DO NOT APPLY "Poly grip" OR ADHESIVES!!!  You may bring a small overnight bag with you on the day of surgery, only pack items that are not valuable. [facility] IS NOT RESPONSIBLE   FOR VALUABLES THAT ARE LOST OR STOLEN.   Do not bring your home medications to the hospital. The Pharmacy will dispense medications listed on your medication list to you during your admission in the Hospital.  Special Instructions: Bring a copy of your healthcare power of attorney and living will documents the day of surgery, if you wish to have them scanned into your [facility] Records- EPIC  Please read over the following fact sheets you were given: IF YOU HAVE QUESTIONS ABOUT YOUR PRE-OP INSTRUCTIONS, PLEASE CALL [phone]  ([place])   [facility] - Preparing for Surgery Before surgery, you can play an important role.  Because skin is not sterile, your skin needs to be as free of germs as possible.  You can reduce the number of germs on your skin by washing with CHG (chlorahexidine gluconate) soap before surgery.  CHG is an antiseptic cleaner which kills germs and bonds with the skin to continue killing germs even after washing. Please DO NOT use if you have an allergy to CHG or antibacterial soaps.  If your skin becomes reddened/irritated stop using the CHG and inform your nurse when you arrive at Short Stay. Do not shave (including legs and underarms) for at least 48 hours prior to the first CHG shower.  You may shave your face/neck.  Please follow these instructions carefully:  1.  Shower with CHG Soap the night before surgery and the  morning of surgery.  2.  If you choose to wash your hair, wash your hair first as usual with your normal  shampoo.  3.  After you shampoo, rinse your hair and body thoroughly to remove the shampoo.  4.  Use CHG as you would any other liquid soap.  You can apply chg directly to the skin and wash.  Gently with a scrungie or  clean washcloth.  5.  Apply the CHG Soap to your body ONLY FROM THE NECK DOWN.   Do not use on face/ open                           Wound or open sores. Avoid contact with eyes, ears mouth and genitals (private parts).                       Wash face,  Genitals (private parts) with your normal soap.             6.  Wash thoroughly, paying special attention to the area where your  surgery  will be performed.  7.  Thoroughly rinse your body with warm water from the neck down.  8.  DO NOT shower/wash with your normal soap after using and rinsing off the CHG Soap.            9.  Pat yourself dry with a clean towel.            10.  Wear clean pajamas.            11.  Place clean sheets on your bed the night of your first shower and do not  sleep with pets.  ON THE DAY OF SURGERY : Do not apply any lotions/deodorants the morning of surgery.  Please wear clean clothes to the hospital/surgery center.    FAILURE TO FOLLOW THESE INSTRUCTIONS MAY RESULT IN THE CANCELLATION OF YOUR SURGERY  PATIENT SIGNATURE_________________________________  NURSE SIGNATURE__________________________________  ________________________________________________________________________        Adam Phenix    An incentive spirometer is a tool that can help keep your lungs clear and active. This tool measures how well you are filling your lungs with each breath. Taking long deep breaths may help reverse or decrease the chance of developing breathing (pulmonary) problems (especially infection) following: A long period of time when you are unable to move or be active. BEFORE THE PROCEDURE  If the spirometer includes an indicator to show your best effort, your nurse or respiratory therapist will set it to a desired goal. If possible, sit up straight or lean slightly forward. Try not to slouch. Hold the incentive spirometer in an upright position. INSTRUCTIONS FOR USE  Sit on the edge of your bed if possible,  or sit up as far as you can in bed or on a chair. Hold the incentive spirometer in an upright position. Breathe out normally. Place the mouthpiece in your mouth and seal your lips tightly around it. Breathe in slowly and as deeply as possible, raising the piston or the ball toward the top of the column. Hold your breath for 3-5 seconds or for as long as possible. Allow the piston or ball to fall to the bottom of the column. Remove the mouthpiece from your mouth and breathe out normally. Rest for a few seconds and repeat Steps 1 through 7 at least 10 times every 1-2 hours when you are awake. Take your time and take a few normal breaths between deep breaths. The spirometer may include an indicator to show your best effort. Use the indicator as a goal to work toward during each repetition. After each set of 10 deep breaths, practice coughing to  be sure your lungs are clear. If you have an incision (the cut made at the time of surgery), support your incision when coughing by placing a pillow or rolled up towels firmly against it. Once you are able to get out of bed, walk around indoors and cough well. You may stop using the incentive spirometer when instructed by your caregiver.  RISKS AND COMPLICATIONS Take your time so you do not get dizzy or light-headed. If you are in pain, you may need to take or ask for pain medication before doing incentive spirometry. It is harder to take a deep breath if you are having pain. AFTER USE Rest and breathe slowly and easily. It can be helpful to keep track of a log of your progress. Your caregiver can provide you with a simple table to help with this. If you are using the spirometer at home, follow these instructions: [place] IF:  You are having difficultly using the spirometer. You have trouble using the spirometer as often as instructed. Your pain medication is not giving enough relief while using the spirometer. You develop fever of 100.5 F (38.1  C) or higher.                                                                                                    SEEK IMMEDIATE MEDICAL CARE IF:  You cough up bloody sputum that had not been present before. You develop fever of 102 F (38.9 C) or greater. You develop worsening pain at or near the incision site. MAKE SURE YOU:  Understand these instructions. Will watch your condition. Will get help right away if you are not doing well or get worse. Document Released: 12/26/2006 Document Revised: 11/07/2011 Document Reviewed: 02/26/2007 [facility] Patient Information 2014 [place], [place].

## 2022-09-08 ENCOUNTER — Other Ambulatory Visit: Payer: Self-pay

## 2022-09-08 ENCOUNTER — Other Ambulatory Visit: Payer: Self-pay | Admitting: Physician Assistant

## 2022-09-08 ENCOUNTER — Encounter (HOSPITAL_COMMUNITY): Payer: Self-pay | Admitting: *Deleted

## 2022-09-08 ENCOUNTER — Encounter (HOSPITAL_COMMUNITY)
Admission: RE | Admit: 2022-09-08 | Discharge: 2022-09-08 | Disposition: A | Discharge status: Home / Self Care | Payer: Medicare PPO | Source: Ambulatory Visit | Attending: Orthopaedic Surgery | Admitting: Orthopaedic Surgery

## 2022-09-08 VITALS — BP 119/70 | HR 67 | Temp 98.0°F | Resp 16 | Ht <= 58 in | Wt 103.0 lb

## 2022-09-08 DIAGNOSIS — R9431 Abnormal electrocardiogram [ECG] [EKG]: Secondary | ICD-10-CM | POA: Diagnosis not present

## 2022-09-08 DIAGNOSIS — I517 Cardiomegaly: Secondary | ICD-10-CM | POA: Diagnosis not present

## 2022-09-08 DIAGNOSIS — Z01818 Encounter for other preprocedural examination: Secondary | ICD-10-CM | POA: Diagnosis not present

## 2022-09-08 DIAGNOSIS — I10 Essential (primary) hypertension: Secondary | ICD-10-CM

## 2022-09-08 HISTORY — DX: Atherosclerotic heart disease of native coronary artery without angina pectoris: I25.10

## 2022-09-08 HISTORY — DX: Malignant (primary) neoplasm, unspecified: C80.1

## 2022-09-08 HISTORY — DX: Acute myocardial infarction, unspecified: I21.9

## 2022-09-08 HISTORY — DX: Unspecified osteoarthritis, unspecified site: M19.90

## 2022-09-08 HISTORY — DX: Other complications of anesthesia, initial encounter: T88.59XA

## 2022-09-08 HISTORY — DX: Hypothyroidism, unspecified: E03.9

## 2022-09-08 LAB — COMPREHENSIVE METABOLIC PANEL
ALT: 17 U/L (ref 0–44)
AST: 21 U/L (ref 15–41)
Albumin: 4.1 g/dL (ref 3.5–5.0)
Alkaline Phosphatase: 68 U/L (ref 38–126)
Anion gap: 10 (ref 5–15)
BUN: 18 mg/dL (ref 8–23)
CO2: 22 mmol/L (ref 22–32)
Calcium: 9.9 mg/dL (ref 8.9–10.3)
Chloride: 102 mmol/L (ref 98–111)
Creatinine, Ser: 0.59 mg/dL (ref 0.44–1.00)
GFR, Estimated: >60 mL/min (ref >60)
Glucose, Bld: 107 mg/dL — ABNORMAL HIGH (ref 70–99)
Potassium: 4.6 mmol/L (ref 3.5–5.1)
Sodium: 134 mmol/L — ABNORMAL LOW (ref 135–145)
Total Bilirubin: 0.6 mg/dL (ref 0.3–1.2)
Total Protein: 7.2 g/dL (ref 6.5–8.1)

## 2022-09-08 LAB — CBC
HCT: 44.2 % (ref 36.0–46.0)
Hemoglobin: 14.4 g/dL (ref 12.0–15.0)
MCH: 29.7 pg (ref 26.0–34.0)
MCHC: 32.6 g/dL (ref 30.0–36.0)
MCV: 91.1 fL (ref 80.0–100.0)
Platelets: 230 10*3/uL (ref 150–400)
RBC: 4.85 MIL/uL (ref 3.87–5.11)
RDW: 13.6 % (ref 11.5–15.5)
WBC: 6.2 10*3/uL (ref 4.0–10.5)
nRBC: 0 % (ref 0.0–0.2)

## 2022-09-08 LAB — SURGICAL PCR SCREEN
MRSA, PCR: NEGATIVE
Staphylococcus aureus: NEGATIVE

## 2022-09-11 NOTE — Progress Notes (Unsigned)
Cardiology Office Note:    Date:  09/13/2022   ID:  Jessica [name], DOB [birth year], MRN [id number]  PCP:  Mckinley Jewel, MD  Cardiologist:  None  Electrophysiologist:  None   Referring MD: Mcarthur Rossetti*   Chief Complaint  Patient presents with   Pre-op Exam    History of Present Illness:    Jessica [name] is a 82 y.o. female with a hx of CAD, TIA, hypothyroidism, hypertension, hyperlipidemia who is referred by Dr. Ninfa Linden for evaluation of CAD and preop evaluation.  Has history of CAD and underwent stent placement in 2003 in [place].  No issues since then.  She previously was walking 5 miles per day and recently has been going to [place] sne[employer] 3 times per week.  Denies any chest pain, dyspnea, lightheadedness, syncope, lower extremity edema, or palpitations.  She can walk up 2 flights of stairs without stopping.  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   aspirin EC 81 MG tablet Take 1 tablet (81 mg total) by mouth daily. Swallow whole.   budesonide (ENTOCORT EC) 3 MG 24 hr capsule Take 3-9 mg by mouth See admin instructions. Take 9 mg daily for 6 weeks, then 6 mg daily  for 2 weeks, then 3 mg daily for 2 weeks as needed for diarrhea   ezetimibe (ZETIA) 10 MG tablet Take 1 tablet (10 mg total) by mouth daily.   latanoprost (XALATAN) 0.005 % ophthalmic solution Place 1 drop into both eyes at bedtime.   levothyroxine (SYNTHROID) 112 MCG tablet Take 112 mcg by mouth daily before breakfast.   metoprolol succinate (TOPROL-XL) 25 MG 24 hr tablet Take 25 mg by mouth every evening.   ramipril (ALTACE) 10 MG capsule Take 10 mg by mouth 2 (two) times daily.   rosuvastatin (CRESTOR) 40 MG tablet Take 40 mg by mouth every evening.   [DISCONTINUED] aspirin EC 325 MG tablet Take 325 mg by mouth every evening.     Allergies:   Gluten meal, Codeine, and Oxycodone   Social History   Socioeconomic History   Marital status: Divorced    Spouse name: Not on file   Number of children: Not on file   Years of education: Not on file   Highest education level: Not on file  Occupational History   Not on file  Tobacco Use   Smoking status: Former    Types: Cigarettes   Smokeless tobacco: Never  Vaping Use   Vaping Use: Never used  Substance and Sexual Activity   Alcohol  use: Yes    Alcohol/week: 14.0 standard drinks of alcohol    Types: 14 Glasses of wine per week   Drug use: Not Currently   Sexual activity: Not Currently  Other Topics Concern   Not on file  Social History Narrative   Not on file   Social Determinants of Health   Financial Resource Strain: Not on file  Food Insecurity: Not on file  Transportation Needs: Not on file  Physical Activity: Not on file  Stress: Not on file  Social Connections: Not on file     Family History: The patient's family history is not on file.  ROS:   Please see the history of present illness.     All other systems reviewed and are negative.  EKGs/Labs/Other Studies Reviewed:    The following studies were reviewed today:   EKG:   09/13/2022: Sinus rhythm with PACs, LVH  Recent Labs: 09/08/2022: ALT 17; BUN 18;  Creatinine, Ser 0.59; Hemoglobin 14.4; Platelets 230; Potassium 4.6; Sodium 134  Recent Lipid Panel No results found for: "CHOL", "TRIG", "HDL", "CHOLHDL", "VLDL", "LDLCALC", "LDLDIRECT"  Physical Exam:    VS:  BP (!) 141/92   Pulse 88   Ht '4\' 10"'$  (1.473 m)   Wt 103 lb (46.7 kg)   SpO2 98%   BMI 21.53 kg/m     Wt Readings from Last 3 Encounters:  09/13/22 103 lb (46.7 kg)  09/08/22 103 lb (46.7 kg)  08/04/22 104 lb (47.2 kg)     GEN:   in no acute distress HEENT: Normal NECK: No JVD; No carotid bruits LYMPHATICS: No lymphadenopathy CARDIAC: RRR, no murmurs, rubs, gallops RESPIRATORY:  Clear to auscultation without rales, wheezing or rhonchi  ABDOMEN: Soft, non-tender, non-distended MUSCULOSKELETAL:  No edema; No deformity  SKIN: Warm and dry NEUROLOGIC:  Alert and oriented x 3 PSYCHIATRIC:  Normal affect   ASSESSMENT:    1. Pre-operative cardiovascular examination   2. Coronary artery disease involving native coronary artery of native heart without angina pectoris   3. Hyperlipidemia, unspecified hyperlipidemia type   4. Essential hypertension    PLAN:    Preop evaluation: Prior to left hip replacement.  History of coronary stenting 20 years ago.  Denies any anginal symptoms.  Good functional capacity.  RCRI score 2 (CAD, TIA).  Overall classify as intermediate risk for an intermediate risk surgery -No further cardiac workup recommended prior to surgery -She has been taking aspirin 325 mg daily.  She stopped this earlier this week in anticipation of surgery.  Given her history of coronary stenting, she should continue aspirin perioperatively.  However she does not need to be on full dose aspirin, would start aspirin 81 mg daily and continue perioperatively  CAD: Status post coronary stenting 20 years ago.  Switch aspirin to 81 mg daily as above.  Continue rosuvastatin  Hypertension: On ramipril 10 mg daily, Toprol-XL 25 mg daily, amiloride 5 mg daily.  BP 141/92  initially in clinic, but was 120/80 on recheck.  Continue current medications  TIA: On aspirin, statin  Hyperlipidemia: On rosuvastatin 40 mg daily.  LDL 97 on 08/03/2022.  Add Zetia 10 mg daily for goal LDL less than 70  RTC in 6 months  Medication Adjustments/Labs and Tests Ordered: Current medicines are reviewed at length with the patient today.  Concerns regarding medicines are outlined above.  Orders Placed This Encounter  Procedures   Lipid panel   EKG 12-Lead   Meds ordered this encounter  Medications  ezetimibe (ZETIA) 10 MG tablet    Sig: Take 1 tablet (10 mg total) by mouth daily.    Dispense:  90 tablet    Refill:  3   aspirin EC 81 MG tablet    Sig: Take 1 tablet (81 mg total) by mouth daily. Swallow whole.    Dispense:  90 tablet    Refill:  3    Patient Instructions  Medication Instructions:  START aspirin 81 mg daily START Zetia 10 mg daily  *If you need a refill on your cardiac medications before your next appointment, please call your pharmacy*  Follow-Up: At [facility], you and your health needs are our priority.  As part of our continuing mission to provide you with exceptional heart care, we have created designated Provider Care Teams.  These Care Teams include your primary Cardiologist (physician) and Advanced Practice Providers (APPs -  Physician Assistants and Nurse Practitioners) who all work together to provide you with the care you need, when you need it.  We recommend signing up for the patient portal called "MyChart".  Sign up information is provided on this After Visit Summary.  MyChart is used to connect with patients for Virtual Visits (Telemedicine).  Patients are able to view lab/test results, encounter notes, upcoming appointments, etc.  Non-urgent messages can be sent to your provider as well.   To learn more about what you can do with MyChart, go to [email or web address].    Your next appointment:   6 month(s)  Provider:    Dr. Gardiner Rhyme    Signed, Donato Heinz, MD  09/13/2022 4:46 PM    [place]

## 2022-09-13 ENCOUNTER — Ambulatory Visit: Payer: Medicare PPO | Attending: Cardiology | Admitting: Cardiology

## 2022-09-13 ENCOUNTER — Encounter: Payer: Self-pay | Admitting: Cardiology

## 2022-09-13 VITALS — BP 141/92 | HR 88 | Ht <= 58 in | Wt 103.0 lb

## 2022-09-13 DIAGNOSIS — I2510 Atherosclerotic heart disease of native coronary artery without angina pectoris: Secondary | ICD-10-CM

## 2022-09-13 DIAGNOSIS — E785 Hyperlipidemia, unspecified: Secondary | ICD-10-CM | POA: Diagnosis not present

## 2022-09-13 DIAGNOSIS — Z01810 Encounter for preprocedural cardiovascular examination: Secondary | ICD-10-CM

## 2022-09-13 DIAGNOSIS — I10 Essential (primary) hypertension: Secondary | ICD-10-CM | POA: Diagnosis not present

## 2022-09-13 MED ORDER — ASPIRIN 81 MG PO TBEC: 81.0000 mg | DELAYED_RELEASE_TABLET | Freq: Every day | ORAL | 3 refills | Status: DC

## 2022-09-13 MED ORDER — EZETIMIBE 10 MG PO TABS: 10.0000 mg | ORAL_TABLET | Freq: Every day | ORAL | 3 refills | Status: AC

## 2022-09-13 NOTE — Patient Instructions (Signed)
Medication Instructions:  START aspirin 81 mg daily START Zetia 10 mg daily  *If you need a refill on your cardiac medications before your next appointment, please call your pharmacy*  Follow-Up: At [facility], you and your health needs are our priority.  As part of our continuing mission to provide you with exceptional heart care, we have created designated Provider Care Teams.  These Care Teams include your primary Cardiologist (physician) and Advanced Practice Providers (APPs -  Physician Assistants and Nurse Practitioners) who all work together to provide you with the care you need, when you need it.  We recommend signing up for the patient portal called "MyChart".  Sign up information is provided on this After Visit Summary.  MyChart is used to connect with patients for Virtual Visits (Telemedicine).  Patients are able to view lab/test results, encounter notes, upcoming appointments, etc.  Non-urgent messages can be sent to your provider as well.   To learn more about what you can do with MyChart, go to [email or web address].    Your next appointment:   6 month(s)  Provider:   Dr. Gardiner Rhyme

## 2022-09-14 NOTE — Progress Notes (Signed)
Anesthesia Chart Review   Case: [id number] Date/Time: 09/16/22 1240   Procedure: LEFT TOTAL HIP ARTHROPLASTY ANTERIOR APPROACH (Left: Hip)   Anesthesia type: Spinal   Pre-op diagnosis: osteoarthritis left hip   Location: [facility] ROOM 09 / [facility]   Surgeons: Mcarthur Rossetti, MD       DISCUSSION:82 y.o. former smoker with h/o CAD (stent 2003), stroke, left hip OA scheduled for above procedure 09/16/2022 with Dr. Jean Rosenthal.   Pt seen by cardiology 09/13/2022 for preoperative evaluation. Per OV note, "Preop evaluation: Prior to left hip replacement.  History of coronary stenting 20 years ago.  Denies any anginal symptoms.  Good functional capacity.  RCRI score 2 (CAD, TIA).  Overall classify as intermediate risk for an intermediate risk surgery -No further cardiac workup recommended prior to surgery -She has been taking aspirin 325 mg daily.  She stopped this earlier this week in anticipation of surgery.  Given her history of coronary stenting, she should continue aspirin perioperatively.  However she does not need to be on full dose aspirin, would start aspirin 81 mg daily and continue perioperatively"  Anticipate pt can proceed with planned procedure barring acute status change.   VS: BP 119/70 Comment: right arm sitting  Pulse 67   Temp 36.7 C (Oral)   Resp 16   Ht '4\' 10"'$  (1.473 m)   Wt 46.7 kg   SpO2 98%   BMI 21.53 kg/m   PROVIDERS: Mckinley Jewel, MD is PCP   Oswaldo Milian, MD is Cardiologist  LABS: Labs reviewed: Acceptable for surgery. 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005 % ophthalmic solution   levothyroxine (SYNTHROID) 112 MCG tablet   metoprolol succinate (TOPROL-XL) 25 MG 24 hr tablet   ramipril (ALTACE) 10 MG capsule   rosuvastatin (CRESTOR) 40 MG tablet   No current facility-administered medications for this encounter.    Konrad Felix Ward, PA-C [facility] [phone]

## 2022-09-15 ENCOUNTER — Telehealth: Payer: Self-pay | Admitting: *Deleted

## 2022-09-15 NOTE — H&P (Signed)
TOTAL HIP ADMISSION H&P  Patient is admitted for left total hip arthroplasty.  Subjective:  Chief Complaint: left hip pain  HPI: Jessica [name], 82 y.o. female, has a history of pain and functional disability in the left hip(s) due to arthritis and patient has failed non-surgical conservative treatments for greater than 12 weeks to include NSAID's and/or analgesics, corticosteriod injections, flexibility and strengthening excercises, supervised PT with diminished ADL's post treatment, use of assistive devices, and activity modification.  Onset of symptoms was gradual starting 3 years ago with rapidlly worsening course since that time.The patient noted no past surgery on the left hip(s).  Patient currently rates pain in the left hip at 10 out of 10 with activity. Patient has night pain, worsening of pain with activity and weight bearing, trendelenberg gait, pain that interfers with activities of daily living, and pain with passive range of motion. Patient has evidence of subchondral cysts, subchondral sclerosis, joint space narrowing, and protrusio  by imaging studies. This condition presents safety issues increasing the risk of falls.  There is no current active infection.  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   Current Outpatient Medications  Medication Sig Dispense Refill Last Dose   aMILoride (MIDAMOR) 5 MG tablet Take 5 mg by mouth daily.      budesonide (ENTOCORT EC) 3 MG 24 hr capsule Take 3-9 mg by mouth See admin instructions. Take 9 mg daily for 6 weeks, then 6 mg daily for 2 weeks, then 3 mg daily for 2 weeks as needed for diarrhea      hydrocortisone cream 1 % Apply 1 Application topically 2 (two) times daily as needed for itching.      latanoprost (XALATAN) 0.005 % ophthalmic solution Place 1 drop into both eyes at bedtime.      levothyroxine (SYNTHROID) 112 MCG tablet Take 112 mcg by mouth daily before breakfast.      metoprolol succinate (TOPROL-XL) 25 MG 24 hr tablet Take 25 mg by mouth every evening.  2    ramipril (ALTACE) 10 MG capsule Take 10 mg by mouth 2 (two) times daily.      rosuvastatin (CRESTOR) 40 MG tablet Take 40 mg by mouth every evening.      aspirin EC 81 MG tablet Take 1 tablet (81 mg total) by mouth daily. Swallow whole. 90 tablet 3    ezetimibe (ZETIA) 10 MG tablet Take 1 tablet (10 mg total) by mouth daily.  90 tablet 3    Allergies  Allergen Reactions   Gluten Meal Diarrhea    Absolutely NO GLUTEN   Codeine Nausea And Vomiting   Oxycodone Nausea And Vomiting    Social History   Tobacco Use   Smoking status: Former    Types: Cigarettes   Smokeless tobacco: Never  Substance Use Topics   Alcohol use: Yes    Alcohol/week: 14.0 standard drinks of alcohol    Types: 14 Glasses of wine per week    No family history on file.   Review of Systems  Musculoskeletal:  Positive for back pain and gait problem.  All other systems reviewed and are negative.   Objective:  Physical Exam Vitals reviewed.   Constitutional:      Appearance: Normal appearance. She is normal weight.  HENT:     Head: Normocephalic and atraumatic.  Eyes:     Extraocular Movements: Extraocular movements intact.     Pupils: Pupils are equal, round, and reactive to light.  Cardiovascular:     Rate and Rhythm: Normal rate.     Pulses: Normal pulses.  Pulmonary:     Effort: Pulmonary effort is normal.     Breath sounds: Normal breath sounds.  Abdominal:     Palpations: Abdomen is soft.  Musculoskeletal:     Cervical back: Normal range of motion and neck supple.     Left hip: Tenderness and bony tenderness present. Decreased range of motion. Decreased strength.  Neurological:     Mental Status: She is alert and oriented to person, place, and time.  Psychiatric:        Behavior: Behavior normal.     Vital signs in last 24 hours:    Labs:   Estimated body mass index is 21.53 kg/m as calculated from the following:   Height as of 09/13/22: '4\' 10"'$  (1.473 m).   Weight as of 09/13/22: 46.7 kg.   Imaging Review Plain radiographs demonstrate severe degenerative joint disease of the left hip(s). The bone quality appears to be fair for age and reported activity level.      Assessment/Plan:  End stage arthritis, left hip(s)  The patient history, physical examination, clinical judgement of the provider and imaging studies are consistent with end stage degenerative joint disease of the left hip(s) and total hip arthroplasty is deemed medically necessary. The treatment options including medical management, injection therapy, arthroscopy and arthroplasty were discussed at length. The risks and benefits of total hip arthroplasty were presented and reviewed. The risks due to aseptic loosening, infection, stiffness, dislocation/subluxation,  thromboembolic complications and other imponderables were discussed.  The patient acknowledged the explanation, agreed to proceed with the plan and consent was signed. Patient is  being admitted for inpatient treatment for surgery, pain control, PT, OT, prophylactic antibiotics, VTE prophylaxis, progressive ambulation and ADL's and discharge planning.The patient is planning to be discharged home with home health services

## 2022-09-15 NOTE — Care Plan (Signed)
Late entry for 09/14/22- Spoke with patient today to discuss her upcoming Left total hip arthroplasty with Dr. Ninfa Linden on 09/16/22. She is an Ortho bundle patient through [facility] and is agreeable to case management. Patient lives alone, but has a daughter and multiple family members that can assist after surgery. She, at first, wanted to go to a SNF for rehab and we discussed this. I also discussed with her daughter, who agreed that she will most likely recover better at home with family assisting. Anticipate HHPT if goes home from hospital after short stay. Referral made to [facility] after choice provided. Reviewed all post op care instructions. Patient has a RW and 3in1/BSC. Will continue to follow for needs.

## 2022-09-15 NOTE — Telephone Encounter (Signed)
Ortho bundle pre-op call completed. 

## 2022-09-16 ENCOUNTER — Ambulatory Visit (HOSPITAL_COMMUNITY): Payer: Medicare PPO

## 2022-09-16 ENCOUNTER — Other Ambulatory Visit: Payer: Self-pay

## 2022-09-16 ENCOUNTER — Encounter (HOSPITAL_COMMUNITY)
Admission: RE | Disposition: A | Discharge status: Home Health | Payer: Self-pay | Source: Home / Self Care | Attending: Orthopaedic Surgery

## 2022-09-16 ENCOUNTER — Observation Stay (HOSPITAL_COMMUNITY)
Admission: RE | Admit: 2022-09-16 | Discharge: 2022-09-19 | Disposition: A | Discharge status: Home Health | Payer: Medicare PPO | Attending: Orthopaedic Surgery | Admitting: Orthopaedic Surgery

## 2022-09-16 ENCOUNTER — Observation Stay (HOSPITAL_COMMUNITY): Payer: Medicare PPO

## 2022-09-16 ENCOUNTER — Ambulatory Visit (HOSPITAL_COMMUNITY): Payer: Medicare PPO | Admitting: Physician Assistant

## 2022-09-16 ENCOUNTER — Ambulatory Visit (HOSPITAL_BASED_OUTPATIENT_CLINIC_OR_DEPARTMENT_OTHER): Payer: Medicare PPO | Admitting: Anesthesiology

## 2022-09-16 ENCOUNTER — Encounter (HOSPITAL_COMMUNITY): Payer: Self-pay | Admitting: Orthopaedic Surgery

## 2022-09-16 DIAGNOSIS — Z87891 Personal history of nicotine dependence: Secondary | ICD-10-CM | POA: Insufficient documentation

## 2022-09-16 DIAGNOSIS — Z96642 Presence of left artificial hip joint: Secondary | ICD-10-CM | POA: Diagnosis not present

## 2022-09-16 DIAGNOSIS — Z7982 Long term (current) use of aspirin: Secondary | ICD-10-CM | POA: Insufficient documentation

## 2022-09-16 DIAGNOSIS — M1612 Unilateral primary osteoarthritis, left hip: Secondary | ICD-10-CM | POA: Diagnosis not present

## 2022-09-16 DIAGNOSIS — Z79899 Other long term (current) drug therapy: Secondary | ICD-10-CM | POA: Diagnosis not present

## 2022-09-16 DIAGNOSIS — I252 Old myocardial infarction: Secondary | ICD-10-CM | POA: Diagnosis not present

## 2022-09-16 DIAGNOSIS — M247 Protrusio acetabuli: Secondary | ICD-10-CM | POA: Insufficient documentation

## 2022-09-16 DIAGNOSIS — I10 Essential (primary) hypertension: Secondary | ICD-10-CM | POA: Diagnosis not present

## 2022-09-16 DIAGNOSIS — Z8673 Personal history of transient ischemic attack (TIA), and cerebral infarction without residual deficits: Secondary | ICD-10-CM | POA: Insufficient documentation

## 2022-09-16 DIAGNOSIS — Z85828 Personal history of other malignant neoplasm of skin: Secondary | ICD-10-CM | POA: Insufficient documentation

## 2022-09-16 DIAGNOSIS — I2510 Atherosclerotic heart disease of native coronary artery without angina pectoris: Secondary | ICD-10-CM | POA: Diagnosis not present

## 2022-09-16 DIAGNOSIS — E039 Hypothyroidism, unspecified: Secondary | ICD-10-CM | POA: Insufficient documentation

## 2022-09-16 DIAGNOSIS — Z471 Aftercare following joint replacement surgery: Secondary | ICD-10-CM | POA: Diagnosis not present

## 2022-09-16 HISTORY — PX: TOTAL HIP ARTHROPLASTY: SHX124

## 2022-09-16 LAB — TYPE AND SCREEN
ABO/RH(D): O NEG
Antibody Screen: NEGATIVE

## 2022-09-16 LAB — ABO/RH: ABO/RH(D): O NEG

## 2022-09-16 SURGERY — ARTHROPLASTY, HIP, TOTAL, ANTERIOR APPROACH
Anesthesia: Spinal | Site: Hip | Laterality: Left

## 2022-09-16 MED ORDER — LEVOTHYROXINE SODIUM 112 MCG PO TABS
112.0000 ug | ORAL_TABLET | Freq: Every day | ORAL | Status: DC
  Administered 2022-09-17 – 2022-09-19 (×3): 112 ug via ORAL
  Filled 2022-09-16 (×3): qty 1

## 2022-09-16 MED ORDER — DEXAMETHASONE SODIUM PHOSPHATE 10 MG/ML IJ SOLN
INTRAMUSCULAR | Status: DC | PRN
  Administered 2022-09-16: 10 mg via INTRAVENOUS

## 2022-09-16 MED ORDER — SODIUM CHLORIDE 0.9 % IR SOLN
Status: DC | PRN
  Administered 2022-09-16: 1000 mL

## 2022-09-16 MED ORDER — LACTATED RINGERS IV SOLN: INTRAVENOUS | Status: DC

## 2022-09-16 MED ORDER — EZETIMIBE 10 MG PO TABS
10.0000 mg | ORAL_TABLET | Freq: Every day | ORAL | Status: DC
  Administered 2022-09-16 – 2022-09-19 (×4): 10 mg via ORAL
  Filled 2022-09-16 (×4): qty 1

## 2022-09-16 MED ORDER — MENTHOL 3 MG MT LOZG: 1.0000 | LOZENGE | OROMUCOSAL | Status: DC | PRN

## 2022-09-16 MED ORDER — HYDROMORPHONE HCL 1 MG/ML IJ SOLN
0.5000 mg | INTRAMUSCULAR | Status: DC | PRN
  Administered 2022-09-16: 0.5 mg via INTRAVENOUS
  Filled 2022-09-16: qty 1

## 2022-09-16 MED ORDER — PROPOFOL 10 MG/ML IV BOLUS
INTRAVENOUS | Status: DC | PRN
  Administered 2022-09-16: 30 mg via INTRAVENOUS

## 2022-09-16 MED ORDER — OXYCODONE HCL 5 MG PO TABS: 10.0000 mg | ORAL_TABLET | ORAL | Status: DC | PRN

## 2022-09-16 MED ORDER — ONDANSETRON HCL 4 MG PO TABS: 4.0000 mg | ORAL_TABLET | Freq: Four times a day (QID) | ORAL | Status: DC | PRN

## 2022-09-16 MED ORDER — PHENYLEPHRINE HCL (PRESSORS) 10 MG/ML IV SOLN
INTRAVENOUS | Status: AC
  Filled 2022-09-16: qty 1

## 2022-09-16 MED ORDER — LACTATED RINGERS IV BOLUS
500.0000 mL | Freq: Once | INTRAVENOUS | Status: AC
  Administered 2022-09-16: 500 mL via INTRAVENOUS

## 2022-09-16 MED ORDER — ONDANSETRON HCL 4 MG/2ML IJ SOLN: 4.0000 mg | Freq: Four times a day (QID) | INTRAMUSCULAR | Status: DC | PRN

## 2022-09-16 MED ORDER — LIDOCAINE HCL (PF) 2 % IJ SOLN
INTRAMUSCULAR | Status: AC
  Filled 2022-09-16: qty 5

## 2022-09-16 MED ORDER — AMISULPRIDE (ANTIEMETIC) 5 MG/2ML IV SOLN: 10.0000 mg | Freq: Once | INTRAVENOUS | Status: DC | PRN

## 2022-09-16 MED ORDER — DOCUSATE SODIUM 100 MG PO CAPS
100.0000 mg | ORAL_CAPSULE | Freq: Two times a day (BID) | ORAL | Status: DC
  Administered 2022-09-16 – 2022-09-19 (×6): 100 mg via ORAL
  Filled 2022-09-16 (×6): qty 1

## 2022-09-16 MED ORDER — LATANOPROST 0.005 % OP SOLN
1.0000 [drp] | Freq: Every day | OPHTHALMIC | Status: DC
  Administered 2022-09-16 – 2022-09-18 (×3): 1 [drp] via OPHTHALMIC
  Filled 2022-09-16: qty 2.5

## 2022-09-16 MED ORDER — AMILORIDE HCL 5 MG PO TABS
5.0000 mg | ORAL_TABLET | Freq: Every day | ORAL | Status: DC
  Administered 2022-09-16 – 2022-09-19 (×2): 5 mg via ORAL
  Filled 2022-09-16 (×4): qty 1

## 2022-09-16 MED ORDER — ACETAMINOPHEN 325 MG PO TABS
325.0000 mg | ORAL_TABLET | Freq: Four times a day (QID) | ORAL | Status: DC | PRN
  Administered 2022-09-16 – 2022-09-17 (×4): 650 mg via ORAL
  Filled 2022-09-16 (×4): qty 2

## 2022-09-16 MED ORDER — DIPHENHYDRAMINE HCL 12.5 MG/5ML PO ELIX: 12.5000 mg | ORAL_SOLUTION | ORAL | Status: DC | PRN

## 2022-09-16 MED ORDER — CEFAZOLIN SODIUM-DEXTROSE 1-4 GM/50ML-% IV SOLN
1.0000 g | Freq: Three times a day (TID) | INTRAVENOUS | Status: AC
  Administered 2022-09-16 – 2022-09-17 (×2): 1 g via INTRAVENOUS
  Filled 2022-09-16 (×2): qty 50

## 2022-09-16 MED ORDER — PROPOFOL 1000 MG/100ML IV EMUL
INTRAVENOUS | Status: AC
  Filled 2022-09-16: qty 100

## 2022-09-16 MED ORDER — METOCLOPRAMIDE HCL 5 MG PO TABS: 5.0000 mg | ORAL_TABLET | Freq: Three times a day (TID) | ORAL | Status: DC | PRN

## 2022-09-16 MED ORDER — POVIDONE-IODINE 10 % EX SWAB: 2.0000 | Freq: Once | CUTANEOUS | Status: DC

## 2022-09-16 MED ORDER — LIDOCAINE HCL (PF) 2 % IJ SOLN
INTRAMUSCULAR | Status: DC | PRN
  Administered 2022-09-16: 50 mg via INTRADERMAL

## 2022-09-16 MED ORDER — OXYCODONE HCL 5 MG PO TABS
5.0000 mg | ORAL_TABLET | ORAL | Status: DC | PRN
  Administered 2022-09-16: 10 mg via ORAL
  Administered 2022-09-17 – 2022-09-19 (×8): 5 mg via ORAL
  Filled 2022-09-16 (×7): qty 1
  Filled 2022-09-16: qty 2
  Filled 2022-09-16 (×2): qty 1

## 2022-09-16 MED ORDER — PANTOPRAZOLE SODIUM 40 MG PO TBEC
40.0000 mg | DELAYED_RELEASE_TABLET | Freq: Every day | ORAL | Status: DC
  Administered 2022-09-16 – 2022-09-19 (×4): 40 mg via ORAL
  Filled 2022-09-16 (×4): qty 1

## 2022-09-16 MED ORDER — CHLORHEXIDINE GLUCONATE 0.12 % MT SOLN
15.0000 mL | Freq: Once | OROMUCOSAL | Status: AC
  Administered 2022-09-16: 15 mL via OROMUCOSAL

## 2022-09-16 MED ORDER — HYDROMORPHONE HCL 1 MG/ML IJ SOLN
INTRAMUSCULAR | Status: AC
  Filled 2022-09-16: qty 1

## 2022-09-16 MED ORDER — RAMIPRIL 5 MG PO CAPS
10.0000 mg | ORAL_CAPSULE | Freq: Two times a day (BID) | ORAL | Status: DC
  Administered 2022-09-16 – 2022-09-19 (×2): 10 mg via ORAL
  Filled 2022-09-16 (×6): qty 2

## 2022-09-16 MED ORDER — ROSUVASTATIN CALCIUM 20 MG PO TABS
40.0000 mg | ORAL_TABLET | Freq: Every evening | ORAL | Status: DC
  Administered 2022-09-16 – 2022-09-18 (×3): 40 mg via ORAL
  Filled 2022-09-16 (×3): qty 2

## 2022-09-16 MED ORDER — TRANEXAMIC ACID-NACL 1000-0.7 MG/100ML-% IV SOLN
1000.0000 mg | INTRAVENOUS | Status: AC
  Administered 2022-09-16: 1000 mg via INTRAVENOUS
  Filled 2022-09-16: qty 100

## 2022-09-16 MED ORDER — METHOCARBAMOL 500 MG PO TABS
500.0000 mg | ORAL_TABLET | Freq: Four times a day (QID) | ORAL | Status: DC | PRN
  Administered 2022-09-16 – 2022-09-18 (×5): 500 mg via ORAL
  Filled 2022-09-16 (×5): qty 1

## 2022-09-16 MED ORDER — HYDROMORPHONE HCL 1 MG/ML IJ SOLN
0.2500 mg | INTRAMUSCULAR | Status: DC | PRN
  Administered 2022-09-16 (×2): 0.25 mg via INTRAVENOUS

## 2022-09-16 MED ORDER — PHENOL 1.4 % MT LIQD: 1.0000 | OROMUCOSAL | Status: DC | PRN

## 2022-09-16 MED ORDER — METOCLOPRAMIDE HCL 5 MG/ML IJ SOLN: 5.0000 mg | Freq: Three times a day (TID) | INTRAMUSCULAR | Status: DC | PRN

## 2022-09-16 MED ORDER — ALUM & MAG HYDROXIDE-SIMETH 200-200-20 MG/5ML PO SUSP: 30.0000 mL | ORAL | Status: DC | PRN

## 2022-09-16 MED ORDER — ORAL CARE MOUTH RINSE: 15.0000 mL | Freq: Once | OROMUCOSAL | Status: AC

## 2022-09-16 MED ORDER — ONDANSETRON HCL 4 MG/2ML IJ SOLN
INTRAMUSCULAR | Status: DC | PRN
  Administered 2022-09-16: 4 mg via INTRAVENOUS

## 2022-09-16 MED ORDER — METOPROLOL SUCCINATE ER 25 MG PO TB24
25.0000 mg | ORAL_TABLET | Freq: Every evening | ORAL | Status: DC
  Administered 2022-09-16 – 2022-09-18 (×2): 25 mg via ORAL
  Filled 2022-09-16 (×3): qty 1

## 2022-09-16 MED ORDER — SODIUM CHLORIDE 0.9 % IV SOLN: INTRAVENOUS | Status: DC

## 2022-09-16 MED ORDER — ASPIRIN 81 MG PO CHEW
81.0000 mg | CHEWABLE_TABLET | Freq: Two times a day (BID) | ORAL | Status: DC
  Administered 2022-09-16 – 2022-09-19 (×6): 81 mg via ORAL
  Filled 2022-09-16 (×6): qty 1

## 2022-09-16 MED ORDER — PROPOFOL 500 MG/50ML IV EMUL
INTRAVENOUS | Status: DC | PRN
  Administered 2022-09-16: 80 ug/kg/min via INTRAVENOUS

## 2022-09-16 MED ORDER — PHENYLEPHRINE HCL-NACL 20-0.9 MG/250ML-% IV SOLN
INTRAVENOUS | Status: DC | PRN
  Administered 2022-09-16: 40 ug/min via INTRAVENOUS

## 2022-09-16 MED ORDER — CEFAZOLIN SODIUM-DEXTROSE 2-4 GM/100ML-% IV SOLN
2.0000 g | INTRAVENOUS | Status: AC
  Administered 2022-09-16: 2 g via INTRAVENOUS
  Filled 2022-09-16: qty 100

## 2022-09-16 MED ORDER — METHOCARBAMOL 1000 MG/10ML IJ SOLN: 500.0000 mg | Freq: Four times a day (QID) | INTRAVENOUS | Status: DC | PRN

## 2022-09-16 MED ORDER — 0.9 % SODIUM CHLORIDE (POUR BTL) OPTIME
TOPICAL | Status: DC | PRN
  Administered 2022-09-16: 1000 mL

## 2022-09-16 MED ORDER — STERILE WATER FOR IRRIGATION IR SOLN
Status: DC | PRN
  Administered 2022-09-16: 2000 mL

## 2022-09-16 MED ORDER — POLYETHYLENE GLYCOL 3350 17 G PO PACK: 17.0000 g | PACK | Freq: Every day | ORAL | Status: DC | PRN

## 2022-09-16 SURGICAL SUPPLY — 44 items
APL SKNCLS STERI-STRIP NONHPOA (GAUZE/BANDAGES/DRESSINGS)
BAG COUNTER SPONGE SURGICOUNT (BAG) ×2 IMPLANT
BAG SPEC THK2 15X12 ZIP CLS (MISCELLANEOUS)
BAG SPNG CNTER NS LX DISP (BAG) ×1
BAG ZIPLOCK 12X15 (MISCELLANEOUS) IMPLANT
BENZOIN TINCTURE PRP APPL 2/3 (GAUZE/BANDAGES/DRESSINGS) IMPLANT
BLADE SAW SGTL 18X1.27X75 (BLADE) ×2 IMPLANT
COVER PERINEAL POST (MISCELLANEOUS) ×2 IMPLANT
COVER SURGICAL LIGHT HANDLE (MISCELLANEOUS) ×2 IMPLANT
CUP SECTOR GRIPTON 50MM (Cup) IMPLANT
DRAPE FOOT SWITCH (DRAPES) ×2 IMPLANT
DRAPE STERI IOBAN 125X83 (DRAPES) ×2 IMPLANT
DRAPE U-SHAPE 47X51 STRL (DRAPES) ×4 IMPLANT
DRSG AQUACEL AG ADV 3.5X10 (GAUZE/BANDAGES/DRESSINGS) ×2 IMPLANT
DURAPREP 26ML APPLICATOR (WOUND CARE) ×2 IMPLANT
ELECT REM PT RETURN 15FT ADLT (MISCELLANEOUS) ×2 IMPLANT
GAUZE XEROFORM 1X8 LF (GAUZE/BANDAGES/DRESSINGS) ×2 IMPLANT
GLOVE BIO SURGEON STRL SZ7.5 (GLOVE) ×2 IMPLANT
GLOVE BIOGEL PI IND STRL 8 (GLOVE) ×4 IMPLANT
GLOVE ECLIPSE 8.0 STRL XLNG CF (GLOVE) ×2 IMPLANT
GOWN STRL REUS W/ TWL XL LVL3 (GOWN DISPOSABLE) ×4 IMPLANT
GOWN STRL REUS W/TWL XL LVL3 (GOWN DISPOSABLE) ×2
HANDPIECE INTERPULSE COAX TIP (DISPOSABLE) ×1
HEAD FEM STD 32X+1 STRL (Hips) IMPLANT
HOLDER FOLEY CATH W/STRAP (MISCELLANEOUS) ×2 IMPLANT
KIT TURNOVER KIT A (KITS) IMPLANT
LINER ACET PNNCL PLUS4 NEUTRAL (Hips) IMPLANT
PACK ANTERIOR HIP CUSTOM (KITS) ×2 IMPLANT
PINNACLE PLUS 4 NEUTRAL (Hips) ×1 IMPLANT
SCREW 6.5MMX25MM (Screw) IMPLANT
SCREW PINN CAN 6.5X20 (Screw) IMPLANT
SET HNDPC FAN SPRY TIP SCT (DISPOSABLE) ×2 IMPLANT
STAPLER VISISTAT 35W (STAPLE) IMPLANT
STEM FEMORAL SZ5 HIGH ACTIS (Stem) IMPLANT
STRIP CLOSURE SKIN 1/2X4 (GAUZE/BANDAGES/DRESSINGS) IMPLANT
SUT ETHIBOND NAB CT1 #1 30IN (SUTURE) ×2 IMPLANT
SUT ETHILON 2 0 PS N (SUTURE) IMPLANT
SUT MNCRL AB 4-0 PS2 18 (SUTURE) IMPLANT
SUT VIC AB 0 CT1 36 (SUTURE) ×2 IMPLANT
SUT VIC AB 1 CT1 36 (SUTURE) ×2 IMPLANT
SUT VIC AB 2-0 CT1 27 (SUTURE) ×2
SUT VIC AB 2-0 CT1 TAPERPNT 27 (SUTURE) ×4 IMPLANT
TRAY FOLEY MTR SLVR 16FR STAT (SET/KITS/TRAYS/PACK) IMPLANT
YANKAUER SUCT BULB TIP NO VENT (SUCTIONS) ×2 IMPLANT

## 2022-09-16 NOTE — Interval H&P Note (Signed)
History and Physical Interval Note: The patient understands that she is here today for a left hip replacement to treat her severe left hip osteoarthritis.  There has been no acute or interval change in her medical status.  See H&P.  The risks and benefits of surgery been explained in detail and informed consent is obtained.  The left operative hip has been marked.  09/16/2022 11:27 AM  Jessica [name]  has presented today for surgery, with the diagnosis of osteoarthritis left hip.  The various methods of treatment have been discussed with the patient and family. After consideration of risks, benefits and other options for treatment, the patient has consented to  Procedure(s): LEFT TOTAL HIP ARTHROPLASTY ANTERIOR APPROACH (Left) as a surgical intervention.  The patient's history has been reviewed, patient examined, no change in status, stable for surgery.  I have reviewed the patient's chart and labs.  Questions were answered to the patient's satisfaction.     Mcarthur Rossetti

## 2022-09-16 NOTE — Transfer of Care (Signed)
Immediate Anesthesia Transfer of Care Note  Patient: [place]  Procedure(s) Performed: LEFT TOTAL HIP ARTHROPLASTY ANTERIOR APPROACH (Left: Hip)  Patient Location: PACU  Anesthesia Type:Spinal  Level of Consciousness: awake, alert , oriented, and patient cooperative  Airway & Oxygen Therapy: Patient Spontanous Breathing and Patient connected to face mask oxygen  Post-op Assessment: Report given to RN and Post -op Vital signs reviewed and stable  Post vital signs: Reviewed and stable  Last Vitals:  Vitals Value Taken Time  BP 85/59 09/16/22 1449  Temp    Pulse 61 09/16/22 1451  Resp 16 09/16/22 1451  SpO2 99 % 09/16/22 1451  Vitals shown include unvalidated device data.  Last Pain:  Vitals:   09/16/22 1014  TempSrc: Oral         Complications: No notable events documented.

## 2022-09-16 NOTE — Anesthesia Procedure Notes (Addendum)
Spinal  Patient location during procedure: OR Start time: 09/16/2022 12:56 PM End time: 09/16/2022 1:02 PM Reason for block: surgical anesthesia Staffing Performed: anesthesiologist  Anesthesiologist: Nolon Nations, MD Performed by: Nolon Nations, MD Authorized by: Nolon Nations, MD   Preanesthetic Checklist Completed: patient identified, IV checked, site marked, risks and benefits discussed, surgical consent, monitors and equipment checked, pre-op evaluation and timeout performed Spinal Block Patient position: sitting Prep: DuraPrep and site prepped and draped Patient monitoring: heart rate, continuous pulse ox and blood pressure Approach: left paramedian Location: L3-4 Injection technique: single-shot Needle Needle type: Spinocan  Needle gauge: 25 G Needle length: 9 cm Additional Notes Expiration date of kit checked and confirmed. Patient tolerated procedure well, without complications.

## 2022-09-16 NOTE — Anesthesia Postprocedure Evaluation (Signed)
Anesthesia Post Note  Patient: Jessica [name]  Procedure(s) Performed: LEFT TOTAL HIP ARTHROPLASTY ANTERIOR APPROACH (Left: Hip)     Patient location during evaluation: PACU Anesthesia Type: Spinal Level of consciousness: awake and alert Pain management: pain level controlled Vital Signs Assessment: post-procedure vital signs reviewed and stable Respiratory status: spontaneous breathing Cardiovascular status: stable Anesthetic complications: no   No notable events documented.  Last Vitals:  Vitals:   09/16/22 1645 09/16/22 1720  BP: (!) 101/59 102/88  Pulse: 62 64  Resp: 15 16  Temp:  36.6 C  SpO2: 100% 100%    Last Pain:  Vitals:   09/16/22 1640  TempSrc:   PainSc: 2                  Nolon Nations

## 2022-09-16 NOTE — Anesthesia Preprocedure Evaluation (Addendum)
Anesthesia Evaluation  Patient identified by MRN, date of birth, ID band Patient awake    Reviewed: Allergy & Precautions, NPO status , Patient's Chart, lab work & pertinent test results  History of Anesthesia Complications (+) history of anesthetic complications  Airway Mallampati: II  TM Distance: >3 FB Neck ROM: Full    Dental no notable dental hx. (+) Dental Advisory Given, Teeth Intact   Pulmonary former smoker   Pulmonary exam normal breath sounds clear to auscultation       Cardiovascular hypertension, Pt. on home beta blockers and Pt. on medications + CAD and + Past MI  Normal cardiovascular exam Rhythm:Regular Rate:Normal     Neuro/Psych CVA    GI/Hepatic negative GI ROS, Neg liver ROS,,,  Endo/Other  Hypothyroidism    Renal/GU negative Renal ROS     Musculoskeletal  (+) Arthritis ,    Abdominal  (+) + obese  Peds  Hematology negative hematology ROS (+)   Anesthesia Other Findings   Reproductive/Obstetrics                             Anesthesia Physical Anesthesia Plan  ASA: 3  Anesthesia Plan: Spinal   Post-op Pain Management: Ofirmev IV (intra-op)*   Induction: Intravenous  PONV Risk Score and Plan: 3 and Ondansetron, Dexamethasone and Treatment may vary due to age or medical condition  Airway Management Planned: Natural Airway  Additional Equipment:   Intra-op Plan:   Post-operative Plan:   Informed Consent: I have reviewed the patients History and Physical, chart, labs and discussed the procedure including the risks, benefits and alternatives for the proposed anesthesia with the patient or authorized representative who has indicated his/her understanding and acceptance.     Dental advisory given  Plan Discussed with: CRNA  Anesthesia Plan Comments:        Anesthesia Quick Evaluation

## 2022-09-16 NOTE — Op Note (Signed)
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 Ninfa Linden, MD Assistant: Benita Stabile, PA-C  Anesthesia: Spinal Antibiotics: 2 g IV Ancef EBL: 703 cc Complications: None  Indications: The patient is a 82 year old female with debilitating arthritis involving her left hip that is gotten significantly worse.  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  Given the severity of her disease/osteoarthritis combined with the pain that is causing her, we have recommended a total hip arthroplasty.  We had a long and thorough discussion about the risk of acute blood loss anemia, nerve or vessel injury, infection, fracture, DVT, dislocation, implant failure, leg length differences and skin and soft tissue issues.  She understands her goals are hopefully decrease pain, improve mobility, and improve quality of life.  Procedure description: After informed consent was obtained and the appropriate left hip was marked, the patient was brought to the operating room and set up on her stretcher where spinal anesthesia was obtained.  She was then laid in supine position on the stretcher and a Foley catheter was placed.  Traction boots were placed on both her feet and then she was placed supine on the Hana fracture table with a perineal post in place in both legs and inline skeletal traction devices but no traction applied.  Her hip and pelvis were then assessed radiographically.  The left operative hip was prepped and draped with DuraPrep and sterile drapes.  A time out was called  and she was identified as the correct patient and the correct left hip.  An incision was then made just inferior and posterior to the ASIS and carried slightly obliquely down the leg.  Dissection was carried down the tensor fascia lata muscle and the tensor fascia was then divided longitudinally to proceed with a direct interposed the hip.  Circumflex vessels were identified and cauterized and the hip capsule was identified and opened up in an L-type format finding a moderate joint effusion.  Cobra retractors were then placed on the medial and lateral femoral neck and a femoral neck cut was made just proximal to the lesser trochanter and completed with an osteotome.  The corkscrew guide was placed in the femoral head and the femoral head was removed in its entirety and found significantly to be formed and severe cartilage loss.  This was difficult given the nature of her protrusio.  A bent Hohmann was then placed over the medial acetabular rim and para-articular osteophytes were removed from around the acetabulum as well as remnants of the acetabular labrum and other debris.  Reaming was then initiated gently given her protrusio and we went with a 43 reamer and stepwise increments up to a size 49 reamer with all reamers placed under direct visualization and the last reamer was placed under direct fluoroscopy and we were able to see our depth of reaming the inclination and anteversion.  The real DePuy sector GRIPTION acetabular component a size 50 was placed.  We went with 3 acetabular dome screws given the sclerotic nature of her bone.  We then went with a 32+4 polythene liner based on her offset and the offset of her  other side.  Attention was then turned to the femur.  With the leg externally rotated to 120 degrees, extended and adducted, a Mueller retractor was placed medially at the calcar and a bent Hohmann was placed behind the greater trochanter.  A box cutting osteotome was used into the femoral canal and  then broaching was initiated from a size 0 broach and stepwise never gets going up to a size 5 broach.  Of note we experience brisk bleeding bone from her intramedullary canal of the femur.  We then trialed our size 5 femoral component with standard offset and a 32+1 hip ball and reduce this in the acetabulum.  Based on radiographic and clinical assessment we felt like we would benefit with more offset.  We have made her longer but we need that for stability after assessing her stability standpoint.  We then dislocated the hip and remove the trial components.  The real Actis femoral component size 5 was placed but 1 with high offset and the real 32+1 metal head ball was placed.  Again this was reduced and acetabulum and stability was definitely noted.  We assessed her leg length and offset and we are close to equal to the other side.  The tissue was then irrigated with normal saline solution.  The joint capsule was closed interrupted #1 Ethibond suture followed by #1 Vicryl close the tensor fascia.  0 Vicryl used to close deep tissue and 2-0 Vicryl is used to close subcutaneous tissue.  Skin was closed with staples.  An Aquacel dressing was applied.  The patient was taken recovery room in stable addition.  Benita Stabile, PA-C did assisted in entire case from beginning to end and his assistance was medically necessary and warranted for soft tissue retraction and management, helping guide implant placement and a layered closure of the wound.

## 2022-09-16 NOTE — Progress Notes (Signed)
Pacu RN Report to floor given  Gave report to  [employer]. Room: 1344    Discussed surgery, meds given in OR and Pacu, VS, IV fluids given, EBL, urine output, pain and other pertinent information. Also discussed if pt had any family or friends here or belongings with them.   Pt had a spinal, it is wearing off and giving her a feeling of pressure on top of her thigh. Cap refill is <3sec, palpable +1 DP pulse, foot is warm, she is moving her foot, leg, able to bend knees and pick up hip slightly off bed. Anesthesia did come by to look at her and said the pressure to thigh is the spinal wearing off.   Gave pain med, it did not help the pressure at all. Put warm blanket on thigh to help wake it up.   Updated front desk to let daughter know she is going up at 505pm and she can go up 10 min later.   Pt exits my care.

## 2022-09-16 NOTE — Evaluation (Signed)
Physical Therapy Evaluation Patient Details Name: Jessica [name] MRN: [id number] DOB: [birth year] Today's Date: 09/16/2022  History of Present Illness  Pt is an 82yo female presenting s/p L-THA, AA on 09/16/22. PMH: CAD, hypothroidism, hx of MI, hx of TIA, scoliosis  Clinical Impression  Jessica [name] is a 82 y.o. female POD 0 s/p L-THA, AA. Patient reports modified independence using trekking pole with mobility at baseline. Patient is now limited by functional impairments (see PT problem list below) and requires min assist for bed mobility and min guard for transfers, further mobility deferred secondary to decreased sensation and motor control at hips, suspect secondary to spinal. Patient instructed in exercise to facilitate ROM and circulation to manage edema. Provided incentive spirometer and with Vcs pt able to achieve 1260m. Patient will benefit from continued skilled PT interventions to address impairments and progress towards PLOF. Acute PT will follow to progress mobility and stair training in preparation for safe discharge; pt would prefer to return home but lives alone and only has 24/7 supervision/assistance for the first two days post-discharge; considering HHPT vs ST SNF-level therapies.  We will continue to follow acutely.     Recommendations for follow up therapy are one component of a multi-disciplinary discharge planning process, led by the attending physician.  Recommendations may be updated based on patient status, additional functional criteria and insurance authorization.  Follow Up Recommendations Follow physician's recommendations for discharge plan and follow up therapies      Assistance Recommended at Discharge Frequent or constant Supervision/Assistance  Patient can return home with the following  A little help with walking and/or transfers;A little help with bathing/dressing/bathroom;Assistance with cooking/housework;Assist for transportation;Help with stairs or  ramp for entrance    Equipment Recommendations Rolling walker (2 wheels)  Recommendations for Other Services       Functional Status Assessment Patient has had a recent decline in their functional status and demonstrates the ability to make significant improvements in function in a reasonable and predictable amount of time.     Precautions / Restrictions Precautions Precautions: Fall Precaution Comments: recent falls Restrictions Weight Bearing Restrictions: No Other Position/Activity Restrictions: wbat      Mobility  Bed Mobility Overal bed mobility: Needs Assistance Bed Mobility: Supine to Sit     Supine to sit: Min assist, HOB elevated     General bed mobility comments: Min assist for bringing LLE off bed    Transfers Overall transfer level: Needs assistance Equipment used: Rolling walker (2 wheels) Transfers: Sit to/from Stand, Bed to chair/wheelchair/BSC Sit to Stand: Min guard   Step pivot transfers: Min guard       General transfer comment: For safety only no physical assist required    Ambulation/Gait               General Gait Details: defered  SScience writer   Modified Rankin (Stroke Patients Only)       Balance Overall balance assessment: Needs assistance Sitting-balance support: No upper extremity supported Sitting balance-Leahy Scale: Good     Standing balance support: Reliant on assistive device for balance, During functional activity, Bilateral upper extremity supported Standing balance-Leahy Scale: Poor                               Pertinent Vitals/Pain Pain Assessment Pain Assessment: 0-10 Pain Score: 5  Pain Location: left hip Pain Descriptors /  Indicators: Operative site guarding Pain Intervention(s): Limited activity within patient's tolerance, Monitored during session, Repositioned, Patient requesting pain meds-RN notified, Ice applied    Home Living Family/patient expects  to be discharged to:: Private residence Living Arrangements: Alone Available Help at Discharge: Family;Available 24 hours/day (Dtr here until Sunday, cousins, family-friend, someone can stay with her 24/7 for first two days at home) Type of Home: House Home Access: Stairs to enter Entrance Stairs-Rails: Right Entrance Stairs-Number of Steps: 4   Home Layout: One level Home Equipment: BSC/3in1;Rollator (4 wheels)      Prior Function Prior Level of Function : Independent/Modified Independent;History of Falls (last six months)             Mobility Comments: Fall: buckling of legs. uses walking stick. ADLs Comments: IND     Hand Dominance        Extremity/Trunk Assessment   Upper Extremity Assessment Upper Extremity Assessment: Overall WFL for tasks assessed    Lower Extremity Assessment Lower Extremity Assessment: RLE deficits/detail;LLE deficits/detail RLE Deficits / Details: MMT ank DF/PF 4/5 RLE Sensation: decreased light touch LLE Deficits / Details: MMT ank DF/PF 4/5 LLE Sensation: decreased light touch    Cervical / Trunk Assessment Cervical / Trunk Assessment: Kyphotic (Hx of scoliosis)  Communication   Communication: No difficulties  Cognition Arousal/Alertness: Awake/alert Behavior During Therapy: WFL for tasks assessed/performed Overall Cognitive Status: Within Functional Limits for tasks assessed                                          General Comments General comments (skin integrity, edema, etc.): Dtr present    Exercises Total Joint Exercises Ankle Circles/Pumps: AROM, 5 reps, 10 reps   Assessment/Plan    PT Assessment Patient needs continued PT services  PT Problem List Decreased strength;Decreased range of motion;Decreased activity tolerance;Decreased balance;Decreased mobility;Pain       PT Treatment Interventions DME instruction;Gait training;Stair training;Functional mobility training;Therapeutic activities;Therapeutic  exercise;Balance training;Neuromuscular re-education;Patient/family education    PT Goals (Current goals can be found in the Care Plan section)  Acute Rehab PT Goals Patient Stated Goal: To walk my dog PT Goal Formulation: With patient Time For Goal Achievement: 09/23/22 Potential to Achieve Goals: Good    Frequency 7X/week     Co-evaluation               AM-PAC PT "6 Clicks" Mobility  Outcome Measure Help needed turning from your back to your side while in a flat bed without using bedrails?: None Help needed moving from lying on your back to sitting on the side of a flat bed without using bedrails?: A Little Help needed moving to and from a bed to a chair (including a wheelchair)?: A Little Help needed standing up from a chair using your arms (e.g., wheelchair or bedside chair)?: A Little Help needed to walk in hospital room?: A Little Help needed climbing 3-5 steps with a railing? : A Little 6 Click Score: 19    End of Session Equipment Utilized During Treatment: Gait belt Activity Tolerance: Patient tolerated treatment well Patient left: in chair;with call bell/phone within reach;with chair alarm set;with family/visitor present Nurse Communication: Mobility status PT Visit Diagnosis: Pain;Difficulty in walking, not elsewhere classified (R26.2) Pain - Right/Left: Left Pain - part of body: Hip    Time: 9924-2683 PT Time Calculation (min) (ACUTE ONLY): 32 min   Charges:   PT  Evaluation $PT Eval Low Complexity: 1 Low PT Treatments $Therapeutic Activity: 8-22 mins        Coolidge Breeze, PT, DPT [facility] Office: [phone] Weekend pager: [phone]  Coolidge Breeze 09/16/2022, 6:31 PM

## 2022-09-17 DIAGNOSIS — M1612 Unilateral primary osteoarthritis, left hip: Secondary | ICD-10-CM | POA: Diagnosis not present

## 2022-09-17 DIAGNOSIS — Z7982 Long term (current) use of aspirin: Secondary | ICD-10-CM | POA: Diagnosis not present

## 2022-09-17 DIAGNOSIS — Z85828 Personal history of other malignant neoplasm of skin: Secondary | ICD-10-CM | POA: Diagnosis not present

## 2022-09-17 DIAGNOSIS — E039 Hypothyroidism, unspecified: Secondary | ICD-10-CM | POA: Diagnosis not present

## 2022-09-17 DIAGNOSIS — M247 Protrusio acetabuli: Secondary | ICD-10-CM | POA: Diagnosis not present

## 2022-09-17 DIAGNOSIS — Z79899 Other long term (current) drug therapy: Secondary | ICD-10-CM | POA: Diagnosis not present

## 2022-09-17 DIAGNOSIS — Z87891 Personal history of nicotine dependence: Secondary | ICD-10-CM | POA: Diagnosis not present

## 2022-09-17 DIAGNOSIS — I2510 Atherosclerotic heart disease of native coronary artery without angina pectoris: Secondary | ICD-10-CM | POA: Diagnosis not present

## 2022-09-17 DIAGNOSIS — Z8673 Personal history of transient ischemic attack (TIA), and cerebral infarction without residual deficits: Secondary | ICD-10-CM | POA: Diagnosis not present

## 2022-09-17 LAB — BASIC METABOLIC PANEL
Anion gap: 3 — ABNORMAL LOW (ref 5–15)
BUN: 17 mg/dL (ref 8–23)
CO2: 24 mmol/L (ref 22–32)
Calcium: 8.2 mg/dL — ABNORMAL LOW (ref 8.9–10.3)
Chloride: 105 mmol/L (ref 98–111)
Creatinine, Ser: 0.5 mg/dL (ref 0.44–1.00)
GFR, Estimated: >60 mL/min (ref >60)
Glucose, Bld: 134 mg/dL — ABNORMAL HIGH (ref 70–99)
Potassium: 4.7 mmol/L (ref 3.5–5.1)
Sodium: 132 mmol/L — ABNORMAL LOW (ref 135–145)

## 2022-09-17 LAB — CBC
HCT: 27.1 % — ABNORMAL LOW (ref 36.0–46.0)
Hemoglobin: 8.9 g/dL — ABNORMAL LOW (ref 12.0–15.0)
MCH: 30 pg (ref 26.0–34.0)
MCHC: 32.8 g/dL (ref 30.0–36.0)
MCV: 91.2 fL (ref 80.0–100.0)
Platelets: 178 10*3/uL (ref 150–400)
RBC: 2.97 MIL/uL — ABNORMAL LOW (ref 3.87–5.11)
RDW: 13.4 % (ref 11.5–15.5)
WBC: 8.7 10*3/uL (ref 4.0–10.5)
nRBC: 0 % (ref 0.0–0.2)

## 2022-09-17 MED ORDER — HYDROCODONE-ACETAMINOPHEN 5-325 MG PO TABS: 1.0000 | ORAL_TABLET | Freq: Four times a day (QID) | ORAL | 0 refills | Status: AC | PRN

## 2022-09-17 MED ORDER — ASPIRIN 81 MG PO CHEW: 81.0000 mg | CHEWABLE_TABLET | Freq: Two times a day (BID) | ORAL | 0 refills | Status: AC

## 2022-09-17 MED ORDER — METHOCARBAMOL 500 MG PO TABS: 500.0000 mg | ORAL_TABLET | Freq: Four times a day (QID) | ORAL | 1 refills | Status: AC | PRN

## 2022-09-17 MED ORDER — SODIUM CHLORIDE 0.9 % IV BOLUS
500.0000 mL | Freq: Once | INTRAVENOUS | Status: AC
  Administered 2022-09-17: 500 mL via INTRAVENOUS

## 2022-09-17 NOTE — Progress Notes (Signed)
Physical Therapy Treatment Patient Details Name: Jessica [name] MRN: [id number] DOB: May 31, 1941 Today's Date: 09/17/2022   History of Present Illness Pt is an 82yo female presenting s/p L-THA, AA on 09/16/22. PMH: CAD, hypothroidism, hx of MI, hx of TIA, scoliosis    PT Comments    Pt seen POD1 for first of two sessions, received supine in bed reporting 7/10 pain and requesting to use BSC. Pt required min guard with use of gait belt to self assist with bed mobility, min guard for transfers using RW, vitals are below. Pt ambulated 131f with RW and min guard, reporting some lightheadedness throughout. Provided HEP and pt completed supine portion with some use of gait belt to self-assist. Second session today will attempt stair training for possible discharge home with HHPT depending on level of supervision/assist provided by family and friends, at this time pt is a HIGH FALL RISK secondary to soft BP with symptomatic report. We will continue to follow acutely.   BP sitting: 112/64, HR 59 BP Standing:118/65, HR71 BP post-ambulation with report of lightheadedness: 98/50, HR58    Recommendations for follow up therapy are one component of a multi-disciplinary discharge planning process, led by the attending physician.  Recommendations may be updated based on patient status, additional functional criteria and insurance authorization.  Follow Up Recommendations  Follow physician's recommendations for discharge plan and follow up therapies (HHPT vs SNF)     Assistance Recommended at Discharge Frequent or constant Supervision/Assistance  Patient can return home with the following A little help with walking and/or transfers;A little help with bathing/dressing/bathroom;Assistance with cooking/housework;Assist for transportation;Help with stairs or ramp for entrance   Equipment Recommendations  Rolling walker (2 wheels)    Recommendations for Other Services       Precautions / Restrictions  Precautions Precautions: Fall Precaution Comments: recent falls Restrictions Weight Bearing Restrictions: No Other Position/Activity Restrictions: wbat     Mobility  Bed Mobility Overal bed mobility: Needs Assistance Bed Mobility: Supine to Sit     Supine to sit: HOB elevated, Min guard     General bed mobility comments: Pt utilized gait belt to self-assist LLE off bed    Transfers Overall transfer level: Needs assistance Equipment used: Rolling walker (2 wheels) Transfers: Sit to/from Stand Sit to Stand: Min guard   Step pivot transfers: Min guard       General transfer comment: For safety only no physical assist required. [facility]    Ambulation/Gait Ambulation/Gait assistance: Min guard Gait Distance (Feet): 120 Feet Assistive device: Rolling walker (2 wheels) Gait Pattern/deviations: Step-to pattern Gait velocity: decreased     General Gait Details: Pt ambulated with RW and min guard, no physical assist required or overt LOB noted.   Stairs             Wheelchair Mobility    Modified Rankin (Stroke Patients Only)       Balance Overall balance assessment: Needs assistance Sitting-balance support: No upper extremity supported Sitting balance-Leahy Scale: Good     Standing balance support: Reliant on assistive device for balance, During functional activity, Bilateral upper extremity supported Standing balance-Leahy Scale: Poor                              Cognition Arousal/Alertness: Awake/alert Behavior During Therapy: WFL for tasks assessed/performed Overall Cognitive Status: Within Functional Limits for tasks assessed  Exercises Total Joint Exercises Ankle Circles/Pumps: AROM, 10 reps, Both Quad Sets: AROM, Left, 10 reps Short Arc Quad: AROM, Left, 10 reps Heel Slides: AAROM, Left, 10 reps Hip ABduction/ADduction: AROM, Left, 10 reps     General Comments General comments (skin integrity, edema, etc.): Dtr PResent      Pertinent Vitals/Pain Pain Assessment Pain Assessment: 0-10 Pain Score: 2  Pain Location: left hip Pain Descriptors / Indicators: Operative site guarding Pain Intervention(s): Limited activity within patient's tolerance, Monitored during session, Repositioned, Ice applied    Home Living                          Prior Function            PT Goals (current goals can now be found in the care plan section) Acute Rehab PT Goals Patient Stated Goal: To walk my dog PT Goal Formulation: With patient Time For Goal Achievement: 09/23/22 Potential to Achieve Goals: Good Progress towards PT goals: Progressing toward goals    Frequency    7X/week      PT Plan Current plan remains appropriate    Co-evaluation              AM-PAC PT "6 Clicks" Mobility   Outcome Measure  Help needed turning from your back to your side while in a flat bed without using bedrails?: None Help needed moving from lying on your back to sitting on the side of a flat bed without using bedrails?: A Little Help needed moving to and from a bed to a chair (including a wheelchair)?: A Little Help needed standing up from a chair using your arms (e.g., wheelchair or bedside chair)?: A Little Help needed to walk in hospital room?: A Little Help needed climbing 3-5 steps with a railing? : A Little 6 Click Score: 19    End of Session Equipment Utilized During Treatment: Gait belt Activity Tolerance: Patient tolerated treatment well;No increased pain Patient left: in chair;with call bell/phone within reach;with chair alarm set;with family/visitor present Nurse Communication: Mobility status PT Visit Diagnosis: Pain;Difficulty in walking, not elsewhere classified (R26.2) Pain - Right/Left: Left Pain - part of body: Hip     Time: 1115-5208 PT Time Calculation (min) (ACUTE ONLY): 32 min  Charges:  $Gait  Training: 8-22 mins $Therapeutic Exercise: 8-22 mins                     Coolidge Breeze, PT, DPT [facility] Office: [phone] Weekend pager: [phone]   Coolidge Breeze 09/17/2022, 11:53 AM

## 2022-09-17 NOTE — Plan of Care (Signed)
  Problem: Education: ?Goal: Knowledge of the prescribed therapeutic regimen will improve ?Outcome: Progressing ?  ?Problem: Activity: ?Goal: Ability to avoid complications of mobility impairment will improve ?Outcome: Progressing ?  ?Problem: Clinical Measurements: ?Goal: Postoperative complications will be avoided or minimized ?Outcome: Progressing ?  ?Problem: Pain Management: ?Goal: Pain level will decrease with appropriate interventions ?Outcome: Progressing ?  ?

## 2022-09-17 NOTE — Progress Notes (Addendum)
Patient's blood pressure is soft - 85/48 (MAP 59) [facility] after-hours was called. Waiting to get orders from the on-call at this moment.  At this time the patient denies SOB, chest pain, and headache. The patient remains alert and oriented x 4  Will continue to monitor patient  0325: S. Bokshan, MD ordered 500 mL NS bolus. Will continue to monitor patient.

## 2022-09-17 NOTE — Progress Notes (Signed)
Subjective: 1 Day Post-Op Procedure(s) (LRB): LEFT TOTAL HIP ARTHROPLASTY ANTERIOR APPROACH (Left) Patient reports pain as moderate.  She is awake and alert this morning and her daughter is at the bedside with her.  She denies any lightheadedness.  Her blood pressure was soft earlier this morning.  She did experience acute blood loss anemia from surgery.  Intraoperatively there was significant bleeding from her femoral canal surprisingly.  Her hemoglobin this morning is 8.9.  That is down from her preoperative value of 14.  Her blood pressure is stable at the moment.  She has worked with physical therapy this morning.  Objective: Vital signs in last 24 hours: Temp:  [97 F (36.1 C)-98.1 F (36.7 C)] 98 F (36.7 C) (01/20 0953) Pulse Rate:  [53-73] 65 (01/20 0953) Resp:  [10-22] 17 (01/20 0953) BP: (71-112)/(48-88) 112/64 (01/20 1030) SpO2:  [91 %-100 %] 94 % (01/20 0953) Weight:  [46.7 kg] 46.7 kg (01/19 1720)  Intake/Output from previous day: 01/19 0701 - 01/20 0700 In: 3129.6 [P.O.:480; I.V.:1899.6; IV Piggyback:750] Out: 950 [Urine:350; Blood:600] Intake/Output this shift: No intake/output data recorded.  Recent Labs    09/17/22 0422  HGB 8.9*   Recent Labs    09/17/22 0422  WBC 8.7  RBC 2.97*  HCT 27.1*  PLT 178   Recent Labs    09/17/22 0422  NA 132*  K 4.7  CL 105  CO2 24  BUN 17  CREATININE 0.50  GLUCOSE 134*  CALCIUM 8.2*   No results for input(s): "LABPT", "INR" in the last 72 hours.  Sensation intact distally Intact pulses distally Dorsiflexion/Plantar flexion intact Incision: scant drainage   Assessment/Plan: 1 Day Post-Op Procedure(s) (LRB): LEFT TOTAL HIP ARTHROPLASTY ANTERIOR APPROACH (Left) Up with therapy Plan for discharge tomorrow Discharge home with home health  I definitely would prefer she stay today and we will check her hemoglobin again in the morning given her acute blood loss anemia.  I will go ahead and send in pain medication  to her pharmacy.  Certainly she may even stay same day depending on her progress and her anemia combined with her vital signs.  If utilization review feels that is appropriate to switch her to an inpatient admission, they have my permission to do so.    Mcarthur Rossetti 09/17/2022, 12:15 PM

## 2022-09-17 NOTE — Discharge Instructions (Signed)

## 2022-09-17 NOTE — TOC Transition Note (Signed)
Transition of Care [facility]) - CM/SW Discharge Note  Patient Details  Name: Jessica [name] MRN: [id number] Date of Birth: 08-25-1941  Transition of Care [facility]) CM/SW Contact:  Sherie Don, LCSW Phone Number: 09/17/2022, 9:49 AM  Clinical Narrative: Patient is expected to discharge home after working with PT. CSW spoke with patient's daughter, [name], to confirm discharge plan and needs. Patient will go home with HHPT, which was prearranged with [place]. Daughter reported patient has a BSC at home, but will need a rolling walker. Daughter agreeable to DME referral to [place]. CSW made referral to [facility] with [place]. [employer] to deliver rolling walker to room TOC signing off.  Final next level of care: Home w Home Health Services Barriers to Discharge: No Barriers Identified  Patient Goals and CMS Choice CMS Medicare.gov Compare Post Acute Care list provided to:: Patient Choice offered to / list presented to : Patient  Discharge Plan and Services Additional resources added to the After Visit Summary for       DME Arranged: Walker rolling DME Agency: [facility] [employer] Date DME Agency Contacted: 09/17/22 Time DME Agency Contacted: [phone] Representative spoke with at DME Agency: Ulice Dash [place]: PT [place]: [place] Representative spoke with at [place] in orthopedist's office  Social Determinants of Health ([place]) Interventions SDOH Screenings   Food Insecurity: No Food Insecurity (09/16/2022)  Housing: Low Risk  (09/16/2022)  Transportation Needs: No Transportation Needs (09/16/2022)  Utilities: Not At Risk (09/16/2022)  Tobacco Use: Medium Risk (09/16/2022)   Readmission Risk Interventions     No data to display

## 2022-09-17 NOTE — Progress Notes (Signed)
Physical Therapy Treatment Patient Details Name: Jessica [name] MRN: [id number] DOB: October 06, 1940 Today's Date: 09/17/2022   History of Present Illness Pt is an 82yo female presenting s/p L-THA, AA on 09/16/22. PMH: CAD, hypothroidism, hx of MI, hx of TIA, scoliosis    PT Comments    Pt seen POD1 for second of two sessions, reporting she had a "great nap." Demonstrated modified independence for bed mobility, supervision for transfers, and min guard for ambulation to restroom and then in hallway 147f. BP post-ambulation 126/53, pt denied any symptoms such as lightheadedness she experienced during first session. Completed set of HEP exercises with minimal cuing. Pt declined stair training at this time citing 7/10 pain. Pt is progressing well from a mobility standpoint and will need to complete stair training prior to discharge, provided educational handouts and answered questions in lieu of physical training at this time. Emphasized that in order to discharge with HHPT post-acutely, pt will require 24/7 supervision/assistance for several days to maintain safe mobility, pt verbalized understanding. We will continue to follow acutely.    Recommendations for follow up therapy are one component of a multi-disciplinary discharge planning process, led by the attending physician.  Recommendations may be updated based on patient status, additional functional criteria and insurance authorization.  Follow Up Recommendations  Follow physician's recommendations for discharge plan and follow up therapies (HHPT vs SNF)     Assistance Recommended at Discharge Frequent or constant Supervision/Assistance  Patient can return home with the following A little help with walking and/or transfers;A little help with bathing/dressing/bathroom;Assistance with cooking/housework;Assist for transportation;Help with stairs or ramp for entrance   Equipment Recommendations  Rolling walker (2 wheels)    Recommendations for  Other Services       Precautions / Restrictions Precautions Precautions: Fall Precaution Comments: recent falls Restrictions Weight Bearing Restrictions: No Other Position/Activity Restrictions: wbat     Mobility  Bed Mobility Overal bed mobility: Needs Assistance Bed Mobility: Supine to Sit     Supine to sit: Modified independent (Device/Increase time)     General bed mobility comments: Increased time    Transfers Overall transfer level: Needs assistance Equipment used: Rolling walker (2 wheels) Transfers: Sit to/from Stand Sit to Stand: Supervision   Step pivot transfers: Min guard       General transfer comment: Supervision for safety only, no physical assist required.    Ambulation/Gait Ambulation/Gait assistance: Min guard Gait Distance (Feet): 150 Feet Assistive device: Rolling walker (2 wheels) Gait Pattern/deviations: Step-to pattern Gait velocity: decreased     General Gait Details: Pt ambulated with RW and min guard, no physical assist required or overt LOB noted.   Stairs Stairs:  (Pt declined stair training today citing pain)           Wheelchair Mobility    Modified Rankin (Stroke Patients Only)       Balance Overall balance assessment: Needs assistance Sitting-balance support: No upper extremity supported Sitting balance-Leahy Scale: Good     Standing balance support: Reliant on assistive device for balance, During functional activity, Bilateral upper extremity supported Standing balance-Leahy Scale: Poor                              Cognition Arousal/Alertness: Awake/alert Behavior During Therapy: WFL for tasks assessed/performed Overall Cognitive Status: Within Functional Limits for tasks assessed  Exercises Total Joint Exercises Ankle Circles/Pumps: AROM, 10 reps, Both Quad Sets: AROM, Left, 10 reps Short Arc Quad: AROM, Left, 10 reps Heel Slides:  Left, 10 reps, AROM Hip ABduction/ADduction: AROM, Left, 10 reps    General Comments        Pertinent Vitals/Pain Pain Assessment Pain Assessment: 0-10 Pain Score: 7  Pain Location: left hip Pain Descriptors / Indicators: Operative site guarding Pain Intervention(s): Monitored during session, Ice applied    Home Living                          Prior Function            PT Goals (current goals can now be found in the care plan section) Acute Rehab PT Goals Patient Stated Goal: To walk my dog PT Goal Formulation: With patient Time For Goal Achievement: 09/23/22 Potential to Achieve Goals: Good Progress towards PT goals: Progressing toward goals    Frequency    7X/week      PT Plan Current plan remains appropriate    Co-evaluation              AM-PAC PT "6 Clicks" Mobility   Outcome Measure  Help needed turning from your back to your side while in a flat bed without using bedrails?: None Help needed moving from lying on your back to sitting on the side of a flat bed without using bedrails?: A Little Help needed moving to and from a bed to a chair (including a wheelchair)?: A Little Help needed standing up from a chair using your arms (e.g., wheelchair or bedside chair)?: A Little Help needed to walk in hospital room?: A Little Help needed climbing 3-5 steps with a railing? : A Little 6 Click Score: 19    End of Session Equipment Utilized During Treatment: Gait belt Activity Tolerance: Patient tolerated treatment well;No increased pain Patient left: in chair;with call bell/phone within reach;with chair alarm set Nurse Communication: Mobility status PT Visit Diagnosis: Pain;Difficulty in walking, not elsewhere classified (R26.2) Pain - Right/Left: Left Pain - part of body: Hip     Time: 1600-1630 PT Time Calculation (min) (ACUTE ONLY): 30 min  Charges:  $Gait Training: 8-22 mins $Therapeutic Exercise: 8-22 mins                      Coolidge Breeze, PT, DPT [facility] Office: [phone] Weekend pager: [phone]   Coolidge Breeze 09/17/2022, 4:32 PM

## 2022-09-18 DIAGNOSIS — M1612 Unilateral primary osteoarthritis, left hip: Secondary | ICD-10-CM | POA: Diagnosis not present

## 2022-09-18 DIAGNOSIS — Z8673 Personal history of transient ischemic attack (TIA), and cerebral infarction without residual deficits: Secondary | ICD-10-CM | POA: Diagnosis not present

## 2022-09-18 DIAGNOSIS — M247 Protrusio acetabuli: Secondary | ICD-10-CM | POA: Diagnosis not present

## 2022-09-18 DIAGNOSIS — I2510 Atherosclerotic heart disease of native coronary artery without angina pectoris: Secondary | ICD-10-CM | POA: Diagnosis not present

## 2022-09-18 DIAGNOSIS — Z85828 Personal history of other malignant neoplasm of skin: Secondary | ICD-10-CM | POA: Diagnosis not present

## 2022-09-18 DIAGNOSIS — Z87891 Personal history of nicotine dependence: Secondary | ICD-10-CM | POA: Diagnosis not present

## 2022-09-18 DIAGNOSIS — E039 Hypothyroidism, unspecified: Secondary | ICD-10-CM | POA: Diagnosis not present

## 2022-09-18 DIAGNOSIS — Z7982 Long term (current) use of aspirin: Secondary | ICD-10-CM | POA: Diagnosis not present

## 2022-09-18 DIAGNOSIS — Z79899 Other long term (current) drug therapy: Secondary | ICD-10-CM | POA: Diagnosis not present

## 2022-09-18 LAB — CBC
HCT: 25.4 % — ABNORMAL LOW (ref 36.0–46.0)
Hemoglobin: 8.5 g/dL — ABNORMAL LOW (ref 12.0–15.0)
MCH: 30.1 pg (ref 26.0–34.0)
MCHC: 33.5 g/dL (ref 30.0–36.0)
MCV: 90.1 fL (ref 80.0–100.0)
Platelets: 153 10*3/uL (ref 150–400)
RBC: 2.82 MIL/uL — ABNORMAL LOW (ref 3.87–5.11)
RDW: 13.3 % (ref 11.5–15.5)
WBC: 7.7 10*3/uL (ref 4.0–10.5)
nRBC: 0 % (ref 0.0–0.2)

## 2022-09-18 NOTE — Progress Notes (Signed)
Physical Therapy Treatment Patient Details Name: Jessica [name] MRN: [id number] DOB: 1940-11-18 Today's Date: 09/18/2022   History of Present Illness Pt is an 82yo female presenting s/p L-THA, AA on 09/16/22. PMH: CAD, hypothroidism, hx of MI, hx of TIA, scoliosis    PT Comments    Pt making good progress this afternoon.  Her family/caregiver for the next few days, Burman Nieves, was present throughout session.  Pt had no lightheadedness and BP was stable.  She had received meds 1-1.5 hours prior to session.  She does report feeling distracted but she was able to follow all commands and answer questions appropriately.  She was able to ambulate 100' and performed stairs similar to home set up.  She was able to progress bed mobility using gait belt to assist L LE but with increased time.  Pt and [name] feeling much better about her ability to return home at discharge tomorrow.  Pt reports not feeling safe/able today and wants more therapy tomorrow prior to d/c.    Recommendations for follow up therapy are one component of a multi-disciplinary discharge planning process, led by the attending physician.  Recommendations may be updated based on patient status, additional functional criteria and insurance authorization.  Follow Up Recommendations  Follow physician's recommendations for discharge plan and follow up therapies (pt wanting HHPT)     Assistance Recommended at Discharge Frequent or constant Supervision/Assistance  Patient can return home with the following A little help with walking and/or transfers;A little help with bathing/dressing/bathroom;Assistance with cooking/housework;Assist for transportation;Help with stairs or ramp for entrance   Equipment Recommendations  Rolling walker (2 wheels)    Recommendations for Other Services       Precautions / Restrictions Precautions Precautions: Fall Restrictions Other Position/Activity Restrictions: wbat     Mobility  Bed  Mobility Overal bed mobility: Needs Assistance Bed Mobility: Supine to Sit, Sit to Supine     Supine to sit: Min assist, Min guard Sit to supine: Min assist, Min guard   General bed mobility comments: Performed initial supine to/from sit getting off toward L side and pt requiring significantly increased time and min A for leg and trunk despite use of gait belt.  Tried again but on R side of bed to simulate home set up and pt able to perform with min guard, gait belt to assist L LE, and increased time. Also head of bed flat and bed height elevated to simulate home.    Transfers Overall transfer level: Needs assistance Equipment used: Rolling walker (2 wheels) Transfers: Sit to/from Stand Sit to Stand: Supervision           General transfer comment: Supervision for safety; cues to back to chair completely; performed x 3    Ambulation/Gait Ambulation/Gait assistance: Supervision Gait Distance (Feet): 100 Feet Assistive device: Rolling walker (2 wheels) Gait Pattern/deviations: Step-through pattern, Decreased weight shift to left, Decreased stride length Gait velocity: decreased     General Gait Details: Supervision for safety; small steps but stable gait   Stairs Stairs: Yes Stairs assistance: Min guard Stair Management: One rail Right, Step to pattern, Forwards, With cane Number of Stairs: 4 General stair comments: Performed 6" steps with rail on R going up and cane with min guard.  Cued for sequence first 2 steps then pt able to cue self.   Wheelchair Mobility    Modified Rankin (Stroke Patients Only)       Balance Overall balance assessment: Needs assistance Sitting-balance support: No upper extremity supported Sitting balance-Leahy  Scale: Good     Standing balance support: Bilateral upper extremity supported, No upper extremity supported Standing balance-Leahy Scale: Fair Standing balance comment: RW to ambulate but able to do adls (underwear, wash hands)  without UE support                            Cognition Arousal/Alertness: Awake/alert Behavior During Therapy: WFL for tasks assessed/performed Overall Cognitive Status: Within Functional Limits for tasks assessed                                 General Comments: Pt received pain meds around 1-1.5 hours prior to therapy and was alert, oriented, able to carry on appropriate conversation.  She had one instance where distracted and forgot what she was saying -but with time remembered.        Exercises Total Joint Exercises Ankle Circles/Pumps: AROM, 10 reps, Both Quad Sets: AROM, Left, 10 reps Heel Slides: Left, 10 reps, AAROM (gait belt to assist) Hip ABduction/ADduction: AAROM, Left, 10 reps (gait belt to assist) Long Arc Quad: AROM, Left, 10 reps, Seated Other Exercises Other Exercises: Educated that walking is most important exercise. Discussed if heel slides and hip abd/add too painful could decrease/stop    General Comments General comments (skin integrity, edema, etc.): [name], family caregiver that will be with pt 2-3 days, present thorughout and observed all transfers/gait.  Educated pt and [name] on transfer techniques, car transfers, and safety.  [name] reports feeling much better after seeing pt ambulate.  Their biggest concern is pt getting up in middle of night for restroom.  BP stable (supine 107/61, sit 102/88, urinary urgency walked to bathroom and bp when sitting 120/50).  Pt with no lightheadedness. Pt feeling more confident about going home tomorrow. Did discuss good progress and that from therapy perspective -SNF would likely not be indicated.      Pertinent Vitals/Pain Pain Assessment Pain Assessment: Faces Faces Pain Scale: Hurts little more Pain Location: left hip with transfers Pain Descriptors / Indicators: Operative site guarding Pain Intervention(s): Limited activity within patient's tolerance, Monitored during session, Ice applied,  Repositioned    Home Living                          Prior Function            PT Goals (current goals can now be found in the care plan section) Progress towards PT goals: Progressing toward goals    Frequency    7X/week      PT Plan Current plan remains appropriate    Co-evaluation              AM-PAC PT "6 Clicks" Mobility   Outcome Measure  Help needed turning from your back to your side while in a flat bed without using bedrails?: None Help needed moving from lying on your back to sitting on the side of a flat bed without using bedrails?: A Little Help needed moving to and from a bed to a chair (including a wheelchair)?: A Little Help needed standing up from a chair using your arms (e.g., wheelchair or bedside chair)?: A Little Help needed to walk in hospital room?: A Little Help needed climbing 3-5 steps with a railing? : A Little 6 Click Score: 19    End of Session Equipment Utilized During Treatment: Gait belt  Activity Tolerance: Patient tolerated treatment well Patient left: in chair;with call bell/phone within reach;with bed alarm set Nurse Communication: Mobility status PT Visit Diagnosis: Pain;Difficulty in walking, not elsewhere classified (R26.2) Pain - Right/Left: Left Pain - part of body: Hip     Time: 2876-8115 PT Time Calculation (min) (ACUTE ONLY): 50 min  Charges:  $Gait Training: 8-22 mins $Therapeutic Exercise: 8-22 mins $Therapeutic Activity: 8-22 mins                     Abran Richard, PT Acute Rehab [facility] [phone]    Karlton Lemon 09/18/2022, 3:38 PM

## 2022-09-18 NOTE — Progress Notes (Addendum)
Physical Therapy Treatment Patient Details Name: Jessica [name] MRN: [id number] DOB: 22-Dec-1940 Today's Date: 09/18/2022   History of Present Illness Pt is an 82yo female presenting s/p L-THA, AA on 09/16/22. PMH: CAD, hypothroidism, hx of MI, hx of TIA, scoliosis    PT Comments    Pt making good improvements this morning; however, she had not received pain meds since 5:05 am and reports feels "clearer" than when she takes her meds.  Daughter also expressing pt appears much better than a few hours ago. Pain control was adequate during therapy. Pt was able to ambulate 150' with RW and min guard while performing cognitive task.  Her BP were soft but stable (improved with activity).  However, again pt had not received pain meds and pt/daughter expressing significant concern about pt being able to manage at home safely particularly with pain meds and low BP and with the level of assist they have at home (dtr leaving, friend that is coming in uncomfortable assisting). They are interested in possible SNF vs at least staying longer for pt to stabilize.  Discussed would f/u this afternoon after pt has had pain meds to further assess.  The friend that plans to stay with pt will also be available this afternoon.  RN made aware and will give meds around 1200 or 1230 for afternoon therapy session.    BP were as follows Supine: 98/52 Sitting 99/56 Standing 105/65 Post walk  in chair 117/79   Recommendations for follow up therapy are one component of a multi-disciplinary discharge planning process, led by the attending physician.  Recommendations may be updated based on patient status, additional functional criteria and insurance authorization.  Follow Up Recommendations  Follow physician's recommendations for discharge plan and follow up therapies     Assistance Recommended at Discharge Frequent or constant Supervision/Assistance  Patient can return home with the following A little help with walking  and/or transfers;A little help with bathing/dressing/bathroom;Assistance with cooking/housework;Assist for transportation;Help with stairs or ramp for entrance   Equipment Recommendations  Rolling walker (2 wheels)    Recommendations for Other Services       Precautions / Restrictions Precautions Precautions: Fall Restrictions Other Position/Activity Restrictions: wbat     Mobility  Bed Mobility Overal bed mobility: Needs Assistance Bed Mobility: Supine to Sit     Supine to sit: Min assist     General bed mobility comments: increased time and min A for L LE    Transfers Overall transfer level: Needs assistance Equipment used: Rolling walker (2 wheels) Transfers: Sit to/from Stand Sit to Stand: Supervision           General transfer comment: Supervision for safety; cues to back to chair completely    Ambulation/Gait Ambulation/Gait assistance: Min guard, Supervision Gait Distance (Feet): 150 Feet Assistive device: Rolling walker (2 wheels) Gait Pattern/deviations: Step-through pattern, Decreased weight shift to left Gait velocity: decreased     General Gait Details: Min guard progressing to supervision; no c/o lightheadedness; able to perform cognitive task; did catch sock one time with RW but no LOB   Stairs             Wheelchair Mobility    Modified Rankin (Stroke Patients Only)       Balance Overall balance assessment: Needs assistance Sitting-balance support: No upper extremity supported Sitting balance-Leahy Scale: Good     Standing balance support: Reliant on assistive device for balance, During functional activity, Bilateral upper extremity supported Standing balance-Leahy Scale: Poor Standing balance comment:  steady with RW                            Cognition Arousal/Alertness: Awake/alert Behavior During Therapy: WFL for tasks assessed/performed Overall Cognitive Status: Within Functional Limits for tasks assessed                                  General Comments: Pt able to say months backward and count down from 100 by 3's easily while walking; reports feels clear now, has not had pain meds since 505 am        Exercises Total Joint Exercises Ankle Circles/Pumps: AROM, 10 reps, Both Long Arc Quad: AROM, Left, 10 reps, Seated (cues for just knee ext and not hip flex)    General Comments General comments (skin integrity, edema, etc.): Daughter present.  Pt and daughter expressing significant concern about pt being able to manage at home due to low BP and confusion/fogginess at times (particularly in evenings).  They expressed concern about incontinence and urinary urgency multiple times at night.  They also report that friend coming into assist for the next few days is 82 yo and uncomfortable assisting pt.  They are interested in possible SNF vs staying until more stable.  Educated on safety with transfers, monitoring syncopal symptoms, BSC and Depends for safety at night.  Also, discussed PT would return this afternoon when pt has had pain meds to continue to treat and reassess.      Pertinent Vitals/Pain Pain Assessment Pain Assessment: Faces Faces Pain Scale: Hurts a little bit Pain Location: left hip Pain Descriptors / Indicators: Operative site guarding Pain Intervention(s): Limited activity within patient's tolerance, Monitored during session    Home Living                          Prior Function            PT Goals (current goals can now be found in the care plan section) Progress towards PT goals: Progressing toward goals    Frequency    7X/week      PT Plan Current plan remains appropriate    Co-evaluation              AM-PAC PT "6 Clicks" Mobility   Outcome Measure  Help needed turning from your back to your side while in a flat bed without using bedrails?: None Help needed moving from lying on your back to sitting on the side of a flat bed  without using bedrails?: A Little Help needed moving to and from a bed to a chair (including a wheelchair)?: A Little Help needed standing up from a chair using your arms (e.g., wheelchair or bedside chair)?: A Little Help needed to walk in hospital room?: A Little Help needed climbing 3-5 steps with a railing? : A Little 6 Click Score: 19    End of Session Equipment Utilized During Treatment: Gait belt Activity Tolerance: Patient tolerated treatment well Patient left: in chair;with call bell/phone within reach;with chair alarm set Nurse Communication: Mobility status;Other (comment) (pain meds around noon if stable) PT Visit Diagnosis: Pain;Difficulty in walking, not elsewhere classified (R26.2) Pain - Right/Left: Left Pain - part of body: Hip     Time: 5916-3846 PT Time Calculation (min) (ACUTE ONLY): 38 min  Charges:  $Gait Training: 8-22 mins $Therapeutic Exercise: 8-22 mins $Therapeutic  Activity: 8-22 mins                     Abran Richard, PT Acute Rehab [facility] [place] 09/18/2022, 10:55 AM

## 2022-09-18 NOTE — Progress Notes (Signed)
Subjective: 2 Days Post-Op Procedure(s) (LRB): LEFT TOTAL HIP ARTHROPLASTY ANTERIOR APPROACH (Left) Patient reports pain as moderate.  She continues to complain of nonfocal lightheadedness.  Overall hemoglobin is stable this morning in the setting of acute blood loss anemia.  Blood pressures have improved.  She is overall concerned about her and her family's ability to manage her at home given her issues with lightheadedness and concern for falls.  Objective: Vital signs in last 24 hours: Temp:  [98 F (36.7 C)-98.7 F (37.1 C)] 98.5 F (36.9 C) (01/21 0459) Pulse Rate:  [63-94] 94 (01/21 0459) Resp:  [16-20] 20 (01/21 0459) BP: (87-115)/(49-64) 113/55 (01/21 0459) SpO2:  [93 %-97 %] 94 % (01/21 0459)  Intake/Output from previous day: 01/20 0701 - 01/21 0700 In: 2185.4 [P.O.:720; I.V.:1465.4] Out: 1 [Urine:1] Intake/Output this shift: No intake/output data recorded.  Recent Labs    09/17/22 0422 09/18/22 0444  HGB 8.9* 8.5*    Recent Labs    09/17/22 0422 09/18/22 0444  WBC 8.7 7.7  RBC 2.97* 2.82*  HCT 27.1* 25.4*  PLT 178 153    Recent Labs    09/17/22 0422  NA 132*  K 4.7  CL 105  CO2 24  BUN 17  CREATININE 0.50  GLUCOSE 134*  CALCIUM 8.2*    No results for input(s): "LABPT", "INR" in the last 72 hours.  Sensation intact distally Intact pulses distally Dorsiflexion/Plantar flexion intact Incision: scant drainage   Assessment/Plan: 2 Days Post-Op Procedure(s) (LRB): LEFT TOTAL HIP ARTHROPLASTY ANTERIOR APPROACH (Left)   Continues to have nonfocal lightheadedness and feeling like she is somewhat foggy from a mental clarity standpoint.  At this time she does have family members in town who would be willing to help her although she is hesitant to be able to get home as she is very concerned about a fall.  May ultimately need to discharge to nursing facility.  Plan to work with physical therapy for additional assessment today.    Keanon Bevins 09/18/2022, 8:51 AM

## 2022-09-18 NOTE — Plan of Care (Signed)
  Problem: Education: Goal: Knowledge of the prescribed therapeutic regimen will improve Outcome: Progressing   Problem: Activity: Goal: Ability to avoid complications of mobility impairment will improve Outcome: Progressing Goal: Ability to tolerate increased activity will improve Outcome: Progressing

## 2022-09-18 NOTE — Plan of Care (Signed)
  Problem: Education: Goal: Knowledge of the prescribed therapeutic regimen will improve Outcome: Progressing   Problem: Activity: Goal: Ability to avoid complications of mobility impairment will improve Outcome: Progressing   Problem: Pain Management: Goal: Pain level will decrease with appropriate interventions Outcome: Progressing

## 2022-09-19 ENCOUNTER — Encounter (HOSPITAL_COMMUNITY): Payer: Self-pay | Admitting: Orthopaedic Surgery

## 2022-09-19 DIAGNOSIS — E039 Hypothyroidism, unspecified: Secondary | ICD-10-CM | POA: Diagnosis not present

## 2022-09-19 DIAGNOSIS — M247 Protrusio acetabuli: Secondary | ICD-10-CM | POA: Diagnosis not present

## 2022-09-19 DIAGNOSIS — Z8673 Personal history of transient ischemic attack (TIA), and cerebral infarction without residual deficits: Secondary | ICD-10-CM | POA: Diagnosis not present

## 2022-09-19 DIAGNOSIS — Z85828 Personal history of other malignant neoplasm of skin: Secondary | ICD-10-CM | POA: Diagnosis not present

## 2022-09-19 DIAGNOSIS — Z87891 Personal history of nicotine dependence: Secondary | ICD-10-CM | POA: Diagnosis not present

## 2022-09-19 DIAGNOSIS — Z7982 Long term (current) use of aspirin: Secondary | ICD-10-CM | POA: Diagnosis not present

## 2022-09-19 DIAGNOSIS — I2510 Atherosclerotic heart disease of native coronary artery without angina pectoris: Secondary | ICD-10-CM | POA: Diagnosis not present

## 2022-09-19 DIAGNOSIS — M1612 Unilateral primary osteoarthritis, left hip: Secondary | ICD-10-CM | POA: Diagnosis not present

## 2022-09-19 DIAGNOSIS — Z79899 Other long term (current) drug therapy: Secondary | ICD-10-CM | POA: Diagnosis not present

## 2022-09-19 NOTE — Discharge Summary (Signed)
Patient ID: Jessica [name] MRN: [id number] DOB/AGE: 03/29/1941 82 y.o.  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 female who was admitted 09/16/2022 for operative treatment ofUnilateral primary osteoarthritis, left hip. Patient has severe unremitting pain that affects sleep, daily activities, and work/hobbies. After pre-op clearance the patient was taken to the operating room on 09/16/2022 and underwent  Procedure(s): LEFT TOTAL HIP ARTHROPLASTY ANTERIOR APPROACH.    Patient was given perioperative antibiotics:  Anti-infectives (From admission, onward)    Start     Dose/Rate Route Frequency Ordered Stop   09/16/22 2100  ceFAZolin (ANCEF) IVPB 1 g/50 mL premix        1 g 100 mL/hr over 30 Minutes Intravenous Every 8 hours 09/16/22 1734 09/17/22 0615   09/16/22 1015  ceFAZolin (ANCEF) IVPB 2g/100 mL premix        2 g 200 mL/hr over 30 Minutes Intravenous On call to O.R. 09/16/22 1004 09/16/22 1303        Patient was given sequential compression devices, early ambulation, and chemoprophylaxis to prevent DVT.  Patient benefited maximally from hospital stay and there were no complications.    Recent vital signs: Patient Vitals for the past 24 hrs:  BP Temp Temp src Pulse Resp SpO2  09/19/22 0932 108/63 --  -- -- -- --  09/19/22 0509 (!) 109/50 99.7 F (37.6 C) Oral 81 17 91 %  09/18/22 2232 117/60 -- -- 81 -- --  09/18/22 2101 136/72 98.2 F (36.8 C) Oral 89 18 95 %  09/18/22 1803 128/65 -- -- 89 -- --  09/18/22 1257 (!) 104/57 97.7 F (36.5 C) -- 87 18 100 %     Recent laboratory studies:  Recent Labs    09/17/22 0422 09/18/22 0444  WBC 8.7 7.7  HGB 8.9* 8.5*  HCT 27.1* 25.4*  PLT 178 153  NA 132*  --   K 4.7  --   CL 105  --   CO2 24  --   BUN 17  --   CREATININE 0.50  --   GLUCOSE 134*  --   CALCIUM 8.2*  --      Discharge Medications:   Allergies as of 09/19/2022       Reactions   Gluten Meal Diarrhea   Absolutely NO GLUTEN   Codeine Nausea And Vomiting   Oxycodone Nausea And Vomiting        Medication List     STOP taking these medications    aspirin EC 81 MG tablet Replaced by: aspirin 81 MG chewable tablet       TAKE these medications    aMILoride 5 MG tablet Commonly known as: MIDAMOR Take 5 mg by mouth daily.   aspirin 81 MG chewable tablet Chew 1 tablet (81 mg total) by  mouth 2 (two) times daily. Replaces: aspirin EC 81 MG tablet   budesonide 3 MG 24 hr capsule Commonly known as: ENTOCORT EC Take 3-9 mg by mouth See admin instructions. Take 9 mg daily for 6 weeks, then 6 mg daily for 2 weeks, then 3 mg daily for 2 weeks as needed for diarrhea   ezetimibe 10 MG tablet Commonly known as: ZETIA Take 1 tablet (10 mg total) by mouth daily.   HYDROcodone-acetaminophen 5-325 MG tablet Commonly known as: Norco Take 1-2 tablets by mouth every 6 (six) hours as needed for moderate pain.   hydrocortisone cream 1 % Apply 1 Application topically 2 (two) times daily as needed for itching.   latanoprost 0.005 % ophthalmic solution Commonly known as: XALATAN Place 1 drop into both eyes at bedtime.   levothyroxine 112 MCG tablet Commonly known as: SYNTHROID Take 112 mcg by mouth daily before breakfast.   methocarbamol 500 MG tablet Commonly  known as: ROBAXIN Take 1 tablet (500 mg total) by mouth every 6 (six) hours as needed for muscle spasms.   metoprolol succinate 25 MG 24 hr tablet Commonly known as: TOPROL-XL Take 25 mg by mouth every evening.   ramipril 10 MG capsule Commonly known as: ALTACE Take 10 mg by mouth 2 (two) times daily.   rosuvastatin 40 MG tablet Commonly known as: CRESTOR Take 40 mg by mouth every evening.               Durable Medical Equipment  (From admission, onward)           Start     Ordered   09/16/22 1735  DME 3 n 1  Once        09/16/22 1734   09/16/22 1735  DME Walker rolling  Once       Question Answer Comment  Walker: With 5 Inch Wheels   Patient needs a walker to treat with the following condition Status post total replacement of left hip      09/16/22 1734            Diagnostic Studies: DG Pelvis Portable  Result Date: 09/16/2022 CLINICAL DATA:  Status post total hip replacement EXAM: PORTABLE PELVIS 1-2 VIEWS COMPARISON:  Pelvis x-ray 08/08/2022 FINDINGS: There is a new left hip total arthroplasty in anatomic alignment. There are lateral skin staples, soft tissue swelling and air involving the left hip compatible with recent surgery. Moderate degenerative changes of the right hip are unchanged. No fractures are seen. IMPRESSION: New left hip total arthroplasty in anatomic alignment. Electronically Signed   By: Ronney Asters M.D.   On: 09/16/2022 15:07   DG HIP UNILAT WITH PELVIS 1V LEFT  Result Date: 09/16/2022 CLINICAL DATA:  Left hip surgery EXAM: DG HIP (WITH OR WITHOUT PELVIS) intraoperative fluoroscopy COMPARISON:  Preop x-ray 08/04/2022 FINDINGS: Three fluoroscopic spot images submitted for review demonstrate placement of a hip arthroplasty with a screw fixated acetabular cup and Press-Fit femoral stem. Expected alignment. 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 Imaging was obtained to aid in treatment. Please correlate with real-time fluoroscopy of  27.1 seconds. Cumulative air kerma 1.8793 mGy IMPRESSION: Intraoperative fluoroscopy Electronically Signed   By: Jill Side M.D.   On: 09/16/2022 14:33   DG C-Arm 1-60 Min-No Report  Result Date: 09/16/2022 Fluoroscopy was utilized by the requesting physician.  No radiographic interpretation.   DG C-Arm 1-60 Min-No Report  Result Date: 09/16/2022 Fluoroscopy was utilized by the requesting physician.  No radiographic interpretation.  Disposition: Discharge disposition: 01-Home or Self Care          Follow-up Information     Mcarthur Rossetti, MD Follow up in 2 week(s).   Specialty: Orthopedic Surgery Contact information: [place] [address] [place], [place] Follow up.   Specialty: Home Health Services Why: [facility] will provide PT in the home after discharge. Contact information: [address] [place] [address] [phone]                  Signed: Mcarthur Rossetti 09/19/2022, 12:47 PM

## 2022-09-19 NOTE — Plan of Care (Signed)
  Problem: Education: Goal: Knowledge of the prescribed therapeutic regimen will improve Outcome: Adequate for Discharge Goal: Understanding of discharge needs will improve 09/19/2022 1232 by Lise Auer, LPN Outcome: Adequate for Discharge 09/19/2022 0923 by Lise Auer, LPN Outcome: Progressing Goal: Individualized Educational Video(s) Outcome: Adequate for Discharge   Problem: Activity: Goal: Ability to avoid complications of mobility impairment will improve 09/19/2022 1232 by Lise Auer, LPN Outcome: Adequate for Discharge 09/19/2022 3007 by Lise Auer, LPN Outcome: Progressing Goal: Ability to tolerate increased activity will improve 09/19/2022 1232 by Lise Auer, LPN Outcome: Adequate for Discharge 09/19/2022 6226 by Lise Auer, LPN Outcome: Progressing   Problem: Clinical Measurements: Goal: Postoperative complications will be avoided or minimized Outcome: Adequate for Discharge   Problem: Pain Management: Goal: Pain level will decrease with appropriate interventions Outcome: Adequate for Discharge   Problem: Skin Integrity: Goal: Will show signs of wound healing Outcome: Adequate for Discharge   Problem: Education: Goal: Knowledge of General Education information will improve Description: Including pain rating scale, medication(s)/side effects and non-pharmacologic comfort measures Outcome: Adequate for Discharge   Problem: Health Behavior/Discharge Planning: Goal: Ability to manage health-related needs will improve Outcome: Adequate for Discharge   Problem: Clinical Measurements: Goal: Ability to maintain clinical measurements within normal limits will improve Outcome: Adequate for Discharge Goal: Will remain free from infection Outcome: Adequate for Discharge Goal: Diagnostic test results will improve Outcome: Adequate for Discharge Goal: Respiratory complications will improve Outcome: Adequate for Discharge Goal:  Cardiovascular complication will be avoided Outcome: Adequate for Discharge   Problem: Activity: Goal: Risk for activity intolerance will decrease Outcome: Adequate for Discharge   Problem: Nutrition: Goal: Adequate nutrition will be maintained Outcome: Adequate for Discharge   Problem: Coping: Goal: Level of anxiety will decrease Outcome: Adequate for Discharge   Problem: Elimination: Goal: Will not experience complications related to bowel motility Outcome: Adequate for Discharge Goal: Will not experience complications related to urinary retention Outcome: Adequate for Discharge   Problem: Pain Managment: Goal: General experience of comfort will improve Outcome: Adequate for Discharge   Problem: Safety: Goal: Ability to remain free from injury will improve Outcome: Adequate for Discharge   Problem: Skin Integrity: Goal: Risk for impaired skin integrity will decrease Outcome: Adequate for Discharge

## 2022-09-19 NOTE — Progress Notes (Signed)
Patient ID: Jessica [name], female   DOB: 1941/05/09, 82 y.o.   MRN: [id number] The patient's vital signs are stable this morning.  She is awake and alert.  She follows commands appropriately.  She denies any lightheadedness.  Early yesterday morning, there was thoughts about her needing short-term skilled nursing.  However the notes from yesterday afternoon state that she had mobilize better overall was doing well and it was felt that discharged home would be more appropriate.  We will work on discharging her to home today.

## 2022-09-19 NOTE — Progress Notes (Addendum)
Physical Therapy Treatment Patient Details Name: Jessica [name] MRN: [id number] DOB: 10/16/1940 Today's Date: 09/19/2022   History of Present Illness Pt is an 82yo female presenting s/p L-THA, AA on 09/16/22. PMH: CAD, hypothroidism, hx of MI, hx of TIA, scoliosis    PT Comments    Pt making excellent progress today.  She was able to ambulate 300' safely and performed stair training with min guard for safety.  Pt requires light min assist for L LE with bed mobility.  Pt's caregiver/family, Burman Nieves , present during session.  Pt and [name] feel confident in return home with no further questions at this time.  Pt demonstrates safe gait & transfers in order to return home from PT perspective once discharged by MD.  While in hospital, will continue to benefit from PT for skilled therapy to advance mobility and exercises.      Recommendations for follow up therapy are one component of a multi-disciplinary discharge planning process, led by the attending physician.  Recommendations may be updated based on patient status, additional functional criteria and insurance authorization.  Follow Up Recommendations  Follow physician's recommendations for discharge plan and follow up therapies     Assistance Recommended at Discharge Frequent or constant Supervision/Assistance  Patient can return home with the following A little help with walking and/or transfers;A little help with bathing/dressing/bathroom;Assistance with cooking/housework;Assist for transportation;Help with stairs or ramp for entrance   Equipment Recommendations  Rolling walker (2 wheels)    Recommendations for Other Services       Precautions / Restrictions Precautions Precautions: Fall Restrictions Weight Bearing Restrictions: No Other Position/Activity Restrictions: wbat     Mobility  Bed Mobility Overal bed mobility: Needs Assistance Bed Mobility: Supine to Sit, Sit to Supine     Supine to sit: Min assist Sit to  supine: Min assist   General bed mobility comments: Pt using gait belt to assist L LE and min HHA to lift trunk.  Min A for L leg back to bed.  Bed height elevated and HOB flat to simulate home environment.  Increased time for transfers but no lightheadedness    Transfers Overall transfer level: Needs assistance Equipment used: Rolling walker (2 wheels) Transfers: Sit to/from Stand Sit to Stand: Supervision           General transfer comment: Performed x 2 safely    Ambulation/Gait Ambulation/Gait assistance: Supervision Gait Distance (Feet): 300 Feet Assistive device: Rolling walker (2 wheels) Gait Pattern/deviations: Decreased weight shift to left, Decreased stride length, Step-through pattern Gait velocity: decreased     General Gait Details: Supervision for safety; small steps but stable gait   Stairs Stairs: Yes   Stair Management: One rail Right, Step to pattern, Forwards, With cane Number of Stairs: 4 General stair comments: Performed 6" steps with rail on R going up and cane with min guard.  Pt able to recall sequencing   Wheelchair Mobility    Modified Rankin (Stroke Patients Only)       Balance Overall balance assessment: Needs assistance Sitting-balance support: No upper extremity supported Sitting balance-Leahy Scale: Good     Standing balance support: Bilateral upper extremity supported, No upper extremity supported Standing balance-Leahy Scale: Fair Standing balance comment: RW to ambulate but able to do adls (underwear, wash hands) without UE support                            Cognition Arousal/Alertness: Awake/alert Behavior During Therapy: [facility] for  tasks assessed/performed Overall Cognitive Status: Within Functional Limits for tasks assessed                                          Exercises Total Joint Exercises Ankle Circles/Pumps: AROM, 10 reps, Both Quad Sets: AROM, Left, 10 reps Heel Slides: Left, 10  reps, AAROM (gait belt to assist) Hip ABduction/ADduction: AAROM, Left, 10 reps (gait belt to assist) Long Arc Quad: AROM, Left, 10 reps, Seated    General Comments General comments (skin integrity, edema, etc.): [name], family caregiver that will be with pt 2-3 days, present thorughout and observed all transfers/gait.  Educated pt and [name] on transfer techniques, stair training, car transfers, and HEP      Pertinent Vitals/Pain Pain Assessment Pain Assessment: 0-10 Pain Score: 3  Pain Location: left hip with transfers Pain Descriptors / Indicators: Operative site guarding Pain Intervention(s): Limited activity within patient's tolerance, Monitored during session, Premedicated before session    Home Living                          Prior Function            PT Goals (current goals can now be found in the care plan section) Progress towards PT goals: Progressing toward goals    Frequency    7X/week      PT Plan Current plan remains appropriate    Co-evaluation              AM-PAC PT "6 Clicks" Mobility   Outcome Measure  Help needed turning from your back to your side while in a flat bed without using bedrails?: None Help needed moving from lying on your back to sitting on the side of a flat bed without using bedrails?: A Little Help needed moving to and from a bed to a chair (including a wheelchair)?: A Little Help needed standing up from a chair using your arms (e.g., wheelchair or bedside chair)?: A Little Help needed to walk in hospital room?: A Little Help needed climbing 3-5 steps with a railing? : A Little 6 Click Score: 19    End of Session Equipment Utilized During Treatment: Gait belt Activity Tolerance: Patient tolerated treatment well Patient left: with call bell/phone within reach;with bed alarm set;in bed Nurse Communication: Mobility status (pt cleared PT; asking to take shower prior to d/c) PT Visit Diagnosis: Pain;Difficulty in  walking, not elsewhere classified (R26.2) Pain - Right/Left: Left Pain - part of body: Hip     Time: 7494-4967 PT Time Calculation (min) (ACUTE ONLY): 30 min  Charges:  $Gait Training: 8-22 mins $Therapeutic Exercise: 8-22 mins                     Abran Richard, PT Acute Rehab [facility] [phone]    Karlton Lemon 09/19/2022, 11:28 AM

## 2022-09-19 NOTE — Plan of Care (Signed)
  Problem: Education: Goal: Understanding of discharge needs will improve Outcome: Progressing   Problem: Activity: Goal: Ability to avoid complications of mobility impairment will improve Outcome: Progressing Goal: Ability to tolerate increased activity will improve Outcome: Progressing

## 2022-09-20 ENCOUNTER — Telehealth: Payer: Self-pay | Admitting: *Deleted

## 2022-09-20 DIAGNOSIS — Z96642 Presence of left artificial hip joint: Secondary | ICD-10-CM | POA: Diagnosis not present

## 2022-09-20 DIAGNOSIS — I10 Essential (primary) hypertension: Secondary | ICD-10-CM | POA: Diagnosis not present

## 2022-09-20 DIAGNOSIS — I252 Old myocardial infarction: Secondary | ICD-10-CM | POA: Diagnosis not present

## 2022-09-20 DIAGNOSIS — M1990 Unspecified osteoarthritis, unspecified site: Secondary | ICD-10-CM | POA: Diagnosis not present

## 2022-09-20 DIAGNOSIS — Z471 Aftercare following joint replacement surgery: Secondary | ICD-10-CM | POA: Diagnosis not present

## 2022-09-20 DIAGNOSIS — I2510 Atherosclerotic heart disease of native coronary artery without angina pectoris: Secondary | ICD-10-CM | POA: Diagnosis not present

## 2022-09-20 DIAGNOSIS — E039 Hypothyroidism, unspecified: Secondary | ICD-10-CM | POA: Diagnosis not present

## 2022-09-20 DIAGNOSIS — M4120 Other idiopathic scoliosis, site unspecified: Secondary | ICD-10-CM | POA: Diagnosis not present

## 2022-09-20 DIAGNOSIS — G8929 Other chronic pain: Secondary | ICD-10-CM | POA: Diagnosis not present

## 2022-09-20 NOTE — Telephone Encounter (Signed)
Ortho bundle D/C call completed. 

## 2022-09-22 DIAGNOSIS — G8929 Other chronic pain: Secondary | ICD-10-CM | POA: Diagnosis not present

## 2022-09-22 DIAGNOSIS — Z471 Aftercare following joint replacement surgery: Secondary | ICD-10-CM | POA: Diagnosis not present

## 2022-09-22 DIAGNOSIS — I10 Essential (primary) hypertension: Secondary | ICD-10-CM | POA: Diagnosis not present

## 2022-09-22 DIAGNOSIS — Z96642 Presence of left artificial hip joint: Secondary | ICD-10-CM | POA: Diagnosis not present

## 2022-09-22 DIAGNOSIS — I2510 Atherosclerotic heart disease of native coronary artery without angina pectoris: Secondary | ICD-10-CM | POA: Diagnosis not present

## 2022-09-22 DIAGNOSIS — E039 Hypothyroidism, unspecified: Secondary | ICD-10-CM | POA: Diagnosis not present

## 2022-09-22 DIAGNOSIS — I252 Old myocardial infarction: Secondary | ICD-10-CM | POA: Diagnosis not present

## 2022-09-22 DIAGNOSIS — M1990 Unspecified osteoarthritis, unspecified site: Secondary | ICD-10-CM | POA: Diagnosis not present

## 2022-09-22 DIAGNOSIS — M4120 Other idiopathic scoliosis, site unspecified: Secondary | ICD-10-CM | POA: Diagnosis not present

## 2022-09-24 DIAGNOSIS — G8929 Other chronic pain: Secondary | ICD-10-CM | POA: Diagnosis not present

## 2022-09-24 DIAGNOSIS — M1990 Unspecified osteoarthritis, unspecified site: Secondary | ICD-10-CM | POA: Diagnosis not present

## 2022-09-24 DIAGNOSIS — I252 Old myocardial infarction: Secondary | ICD-10-CM | POA: Diagnosis not present

## 2022-09-24 DIAGNOSIS — Z471 Aftercare following joint replacement surgery: Secondary | ICD-10-CM | POA: Diagnosis not present

## 2022-09-24 DIAGNOSIS — E039 Hypothyroidism, unspecified: Secondary | ICD-10-CM | POA: Diagnosis not present

## 2022-09-24 DIAGNOSIS — I10 Essential (primary) hypertension: Secondary | ICD-10-CM | POA: Diagnosis not present

## 2022-09-24 DIAGNOSIS — Z96642 Presence of left artificial hip joint: Secondary | ICD-10-CM | POA: Diagnosis not present

## 2022-09-24 DIAGNOSIS — I2510 Atherosclerotic heart disease of native coronary artery without angina pectoris: Secondary | ICD-10-CM | POA: Diagnosis not present

## 2022-09-24 DIAGNOSIS — M4120 Other idiopathic scoliosis, site unspecified: Secondary | ICD-10-CM | POA: Diagnosis not present

## 2022-09-26 ENCOUNTER — Telehealth: Payer: Self-pay | Admitting: *Deleted

## 2022-09-26 DIAGNOSIS — G8929 Other chronic pain: Secondary | ICD-10-CM | POA: Diagnosis not present

## 2022-09-26 DIAGNOSIS — Z471 Aftercare following joint replacement surgery: Secondary | ICD-10-CM | POA: Diagnosis not present

## 2022-09-26 DIAGNOSIS — I10 Essential (primary) hypertension: Secondary | ICD-10-CM | POA: Diagnosis not present

## 2022-09-26 DIAGNOSIS — I252 Old myocardial infarction: Secondary | ICD-10-CM | POA: Diagnosis not present

## 2022-09-26 DIAGNOSIS — Z96642 Presence of left artificial hip joint: Secondary | ICD-10-CM | POA: Diagnosis not present

## 2022-09-26 DIAGNOSIS — E039 Hypothyroidism, unspecified: Secondary | ICD-10-CM | POA: Diagnosis not present

## 2022-09-26 DIAGNOSIS — M1990 Unspecified osteoarthritis, unspecified site: Secondary | ICD-10-CM | POA: Diagnosis not present

## 2022-09-26 DIAGNOSIS — I2510 Atherosclerotic heart disease of native coronary artery without angina pectoris: Secondary | ICD-10-CM | POA: Diagnosis not present

## 2022-09-26 DIAGNOSIS — M4120 Other idiopathic scoliosis, site unspecified: Secondary | ICD-10-CM | POA: Diagnosis not present

## 2022-09-26 NOTE — Telephone Encounter (Signed)
Ortho bundle call completed. Patient doing well.

## 2022-09-28 DIAGNOSIS — Z96642 Presence of left artificial hip joint: Secondary | ICD-10-CM | POA: Diagnosis not present

## 2022-09-28 DIAGNOSIS — Z471 Aftercare following joint replacement surgery: Secondary | ICD-10-CM | POA: Diagnosis not present

## 2022-09-28 DIAGNOSIS — M1990 Unspecified osteoarthritis, unspecified site: Secondary | ICD-10-CM | POA: Diagnosis not present

## 2022-09-28 DIAGNOSIS — I10 Essential (primary) hypertension: Secondary | ICD-10-CM | POA: Diagnosis not present

## 2022-09-28 DIAGNOSIS — M4120 Other idiopathic scoliosis, site unspecified: Secondary | ICD-10-CM | POA: Diagnosis not present

## 2022-09-28 DIAGNOSIS — I2510 Atherosclerotic heart disease of native coronary artery without angina pectoris: Secondary | ICD-10-CM | POA: Diagnosis not present

## 2022-09-28 DIAGNOSIS — I252 Old myocardial infarction: Secondary | ICD-10-CM | POA: Diagnosis not present

## 2022-09-28 DIAGNOSIS — G8929 Other chronic pain: Secondary | ICD-10-CM | POA: Diagnosis not present

## 2022-09-28 DIAGNOSIS — E039 Hypothyroidism, unspecified: Secondary | ICD-10-CM | POA: Diagnosis not present

## 2022-09-29 ENCOUNTER — Encounter: Payer: Self-pay | Admitting: Orthopaedic Surgery

## 2022-09-29 ENCOUNTER — Ambulatory Visit (INDEPENDENT_AMBULATORY_CARE_PROVIDER_SITE_OTHER): Payer: Medicare PPO | Admitting: Orthopaedic Surgery

## 2022-09-29 DIAGNOSIS — Z96642 Presence of left artificial hip joint: Secondary | ICD-10-CM

## 2022-09-29 NOTE — Progress Notes (Signed)
The patient is here in follow-up status post a left total hip arthroplasty.  She reports good range of motion and strength.  She is ambulate with a cane.  She has been compliant with a baby aspirin twice a day.  She is 82 years old.  Her incision looks good and the staples have been removed and Steri-Strips applied.  She does have a moderate seroma aspirated about 45 cc of fluid from the area and potentially could have gotten more.  She is doing well overall.  She will continue to increase her activities as comfort allows.  She will go back to just her baby aspirin once daily.  All questions and concerns were answered and addressed.  Will see her back in 4 weeks to see how she is doing overall from a mobility standpoint but no x-rays are needed.

## 2022-09-30 ENCOUNTER — Telehealth: Payer: Self-pay | Admitting: *Deleted

## 2022-09-30 DIAGNOSIS — Z471 Aftercare following joint replacement surgery: Secondary | ICD-10-CM | POA: Diagnosis not present

## 2022-09-30 DIAGNOSIS — E039 Hypothyroidism, unspecified: Secondary | ICD-10-CM | POA: Diagnosis not present

## 2022-09-30 DIAGNOSIS — I10 Essential (primary) hypertension: Secondary | ICD-10-CM | POA: Diagnosis not present

## 2022-09-30 DIAGNOSIS — M4120 Other idiopathic scoliosis, site unspecified: Secondary | ICD-10-CM | POA: Diagnosis not present

## 2022-09-30 DIAGNOSIS — Z96642 Presence of left artificial hip joint: Secondary | ICD-10-CM | POA: Diagnosis not present

## 2022-09-30 DIAGNOSIS — G8929 Other chronic pain: Secondary | ICD-10-CM | POA: Diagnosis not present

## 2022-09-30 DIAGNOSIS — M1990 Unspecified osteoarthritis, unspecified site: Secondary | ICD-10-CM | POA: Diagnosis not present

## 2022-09-30 DIAGNOSIS — I2510 Atherosclerotic heart disease of native coronary artery without angina pectoris: Secondary | ICD-10-CM | POA: Diagnosis not present

## 2022-09-30 DIAGNOSIS — I252 Old myocardial infarction: Secondary | ICD-10-CM | POA: Diagnosis not present

## 2022-09-30 NOTE — Telephone Encounter (Signed)
Ortho bundle in person meeting during office visit on 09/29/22.

## 2022-10-03 ENCOUNTER — Ambulatory Visit (HOSPITAL_COMMUNITY)
Admission: RE | Admit: 2022-10-03 | Discharge: 2022-10-03 | Disposition: A | Discharge status: Home / Self Care | Payer: Medicare PPO | Source: Ambulatory Visit | Attending: Orthopaedic Surgery | Admitting: Orthopaedic Surgery

## 2022-10-03 ENCOUNTER — Other Ambulatory Visit: Payer: Self-pay | Admitting: *Deleted

## 2022-10-03 ENCOUNTER — Telehealth: Payer: Self-pay | Admitting: *Deleted

## 2022-10-03 DIAGNOSIS — Z96642 Presence of left artificial hip joint: Secondary | ICD-10-CM | POA: Insufficient documentation

## 2022-10-03 DIAGNOSIS — M79662 Pain in left lower leg: Secondary | ICD-10-CM

## 2022-10-03 NOTE — Telephone Encounter (Signed)
Patient called me just now and states she is having calf pain when she walks on her surgical leg. S/P Left hip on 09/16/22. She says it is not swollen, but is tender to touch as well.

## 2022-10-03 NOTE — Telephone Encounter (Signed)
Pt is aware of appt scheduled for [place] today at [facility] on [address] at [place].

## 2022-10-11 ENCOUNTER — Ambulatory Visit: Payer: Medicare PPO | Admitting: Sports Medicine

## 2022-10-11 VITALS — BP 102/60 | Ht <= 58 in | Wt 103.0 lb

## 2022-10-11 DIAGNOSIS — M1612 Unilateral primary osteoarthritis, left hip: Secondary | ICD-10-CM

## 2022-10-11 DIAGNOSIS — R269 Unspecified abnormalities of gait and mobility: Secondary | ICD-10-CM

## 2022-10-11 NOTE — Patient Instructions (Signed)
For your walking regimen: First two weeks: walk a quarter mile 3 times per day. Use your walking stick for stability. Weeks 3 & 4: walk a half mile 3 times per day Weeks 5 & 6: try to walk three quarters of a mile 3 times per day.   Do gradual hip range of motion exercises. Start slow and work your way up.  Come back in for an office visit in 6 weeks.

## 2022-10-11 NOTE — Assessment & Plan Note (Signed)
We had to modify her custom orthotics to take corrections off left On RT side we used a 5/16 3/4 insert in shoes and a 5/16 heel lift on orthotic  Following each of these we observed her gait and it was much improved  Adjust to these as we begin her walking program  Work on Hip Motion gradually  Reck 6 weeks

## 2022-10-11 NOTE — Progress Notes (Signed)
  Jessica [name] - 82 y.o. female MRN [id number]  Date of birth: 1940-09-20  SUBJECTIVE:  Including CC & ROS.  CC: Left calf pain  HPI: Patient is presenting for new anterior left calf pain. She is 3 weeks post op from L hip replacement. Since then, she has felt soreness and shooting pain down the anterior and lateral side of her calf into the ankle joint. Her PT friends told her she has a gait shift. Her hip pain is greatly improved since the surgery.   PT also found greater amount of leg length difference   HISTORY: Past Medical, Surgical, Social, and Family History Reviewed & Updated per EMR.   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  - Palpation: No TTP, specifically none over greater trochanter - ROM: Moderately decreased range of motion on Flexion, extension, abduction, internal and external rotation but improved vs. Perop Rotary motion is now about 15/25 total 40 left vs. 20/35 total 55 RT - Strength: Normal strength. - Neuro/vasc: NV intact distally  Knee: - Inspection: no gross deformity. No swelling/effusion, erythema or bruising. Skin intact - Palpation: no TTP - ROM: full active ROM with flexion and extension in knee and hip - Strength: 5/5 strength - Neuro/vasc: NV intact - Special Tests: negative anterior and posterior drawer, no MCL or LCL laxity   TTP over left anterior tibialis  Scoliosis : as noted before but on standing Left iliac crest is significantly elevated  Walking gait with flexion of left knee and more trendelenburg   ASSESSMENT & PLAN: See problem based charting & AVS for pt instructions.  Left shin pain - Patient has limb length discrepancy s/p hip  replacement likely causing change in walking gait  Estevan Oaks, [place] [facility]  I observed and examined the patient with the medical student and agree with assessment and plan.  I made a number of revisions. Note reviewed and modified by me.  Ila Mcgill, MD

## 2022-10-27 ENCOUNTER — Encounter: Payer: Self-pay | Admitting: Orthopaedic Surgery

## 2022-10-27 ENCOUNTER — Ambulatory Visit (INDEPENDENT_AMBULATORY_CARE_PROVIDER_SITE_OTHER): Payer: Medicare PPO | Admitting: Orthopaedic Surgery

## 2022-10-27 DIAGNOSIS — Z96642 Presence of left artificial hip joint: Secondary | ICD-10-CM

## 2022-10-27 NOTE — Progress Notes (Signed)
The patient is now 6 weeks status post a left total hip arthroplasty.  She did let me know of some of the issues she had with home therapy which were certainly appropriate for me to hear.  She did end up seeing Dr. Hector Shade who gave her home exercise program and that is done well for her.  She is ambulate with a cane.  She does feel a small area around her incision where there may be a retained suture.  I did find a small retained suture that I removed easily.  I placed a Band-Aid over this area and it should heal easily.  Her left operative hip is still stiff but is moving better than what she had before surgery for sure.  She did let me know she is having some up-and-down issues with blood pressure.  She does see her primary care physician next month.  Some of these things can be absolutely normal after having extensive surgery like she did.  From my standpoint I would like to see her back in 4 weeks.  At that visit we should go ahead and have a standing low AP pelvis and lateral of her left hip given her previous deformity prior to surgery.

## 2022-11-02 ENCOUNTER — Other Ambulatory Visit: Payer: Self-pay

## 2022-11-02 ENCOUNTER — Telehealth: Payer: Self-pay | Admitting: Orthopaedic Surgery

## 2022-11-02 NOTE — Telephone Encounter (Signed)
Patient aware she only need premed for 3 months after, she states she is just going to reschedule her dental appt for later

## 2022-11-02 NOTE — Telephone Encounter (Signed)
Patient called. Says she is having her teeth cleaned 3/7. Would like to know if she needs any medication before going? Her call back number is [phone]

## 2022-11-03 ENCOUNTER — Encounter: Payer: Self-pay | Admitting: Radiology

## 2022-11-03 ENCOUNTER — Encounter: Payer: Medicare PPO | Admitting: Orthopaedic Surgery

## 2022-11-09 ENCOUNTER — Telehealth: Payer: Self-pay | Admitting: *Deleted

## 2022-11-09 NOTE — Telephone Encounter (Signed)
Ortho bundle call completed. 

## 2022-11-14 DIAGNOSIS — E785 Hyperlipidemia, unspecified: Secondary | ICD-10-CM | POA: Diagnosis not present

## 2022-11-15 LAB — LIPID PANEL
Chol/HDL Ratio: 2.1 ratio (ref 0.0–4.4)
Cholesterol, Total: 131 mg/dL (ref 100–199)
HDL: 62 mg/dL (ref >39)
LDL Chol Calc (NIH): 55 mg/dL (ref 0–99)
Triglycerides: 69 mg/dL (ref 0–149)
VLDL Cholesterol Cal: 14 mg/dL (ref 5–40)

## 2022-11-22 ENCOUNTER — Ambulatory Visit: Payer: Medicare PPO | Admitting: Sports Medicine

## 2022-11-22 VITALS — BP 112/72 | Ht <= 58 in | Wt 104.0 lb

## 2022-11-22 DIAGNOSIS — R269 Unspecified abnormalities of gait and mobility: Secondary | ICD-10-CM

## 2022-11-22 NOTE — Progress Notes (Signed)
   Established Patient Office Visit  Subjective   Patient ID: Jessica [name], female    DOB: 10-09-1940  Age: 82 y.o. MRN: [id number]  Right foot pain.  Jessica [name] is here today with chief complaint of right foot pain.  We saw her approximately 1 month ago and placed heel lift under her right orthotic as she had undergone left total hip replacement with a leg length discrepancy.  Since then she started to notice her foot rolling at work putting pressure on the outside of her foot.  She has recently been able to walk up to 3 miles on flat surface however this is keeping her from her walks.  She denies any injury to that area.  Of note her left hip total arthroplasty was at the end of January   ROS as listed above in HPI    Objective:     BP 112/72   Ht 4\' 10"  (1.473 m)   Wt 104 lb (47.2 kg)   BMI 21.74 kg/m   Physical Exam Vitals reviewed.  Constitutional:      General: She is not in acute distress.    Appearance: Normal appearance. She is not ill-appearing, toxic-appearing or diaphoretic.  Pulmonary:     Effort: Pulmonary effort is normal.  Neurological:     Mental Status: She is alert.   Right foot: Pes cavus, she has some hammering of her toes, no ecchymosis edema or effusion.  No tenderness to palpation at the base of the fifth metatarsal.  Full range of motion dorsiflexion, plantarflexion inversion and eversion.  Weakness with resisted inversion/supination. No tenderness to palpation around medial or lateral malleoli, ATFL or Achilles.  She does have an appreciable left leg discrepancy left medial malleolus more inferior than right Gait analysis: She walks with hard heel strike and supination.  Minimal to no push off from the toes.     Assessment & Plan:   Problem List Items Addressed This Visit       Other   Gait abnormality - Primary    Today I removed right heel lift that was placed previously and replaced it with a 2-1/2 inch three-quarter length lateral  wedge.  She ambulated with this reported she felt better.  She still did have some supination.  Secondary to her leg length discrepancy and moderate to severe scoliosis at this point we will try to treat her symptomatically.  She was also given to lateral heel wedges to place in her other tennis shoes.  Will follow-up with her as needed. We reviewed some thoracic and lumbar spine stretches and recommend she do those every morning.       Return if symptoms worsen or fail to improve.    Elmore Guise, DO  I observed and examined the patient with the [facility] resident and agree with assessment and plan.  Note reviewed and modified by me. Ila Mcgill, MD

## 2022-11-22 NOTE — Assessment & Plan Note (Signed)
Today I removed right heel lift that was placed previously and replaced it with a 2-1/2 inch three-quarter length lateral wedge.  She ambulated with this reported she felt better.  She still did have some supination.  Secondary to her leg length discrepancy and moderate to severe scoliosis at this point we will try to treat her symptomatically.  She was also given to lateral heel wedges to place in her other tennis shoes.  Will follow-up with her as needed. We reviewed some thoracic and lumbar spine stretches and recommend she do those every morning.

## 2022-11-28 ENCOUNTER — Encounter: Payer: Self-pay | Admitting: Orthopaedic Surgery

## 2022-11-28 ENCOUNTER — Other Ambulatory Visit (INDEPENDENT_AMBULATORY_CARE_PROVIDER_SITE_OTHER): Payer: Medicare PPO

## 2022-11-28 ENCOUNTER — Ambulatory Visit (INDEPENDENT_AMBULATORY_CARE_PROVIDER_SITE_OTHER): Payer: Medicare PPO | Admitting: Orthopaedic Surgery

## 2022-11-28 DIAGNOSIS — Z96642 Presence of left artificial hip joint: Secondary | ICD-10-CM

## 2022-11-28 NOTE — Progress Notes (Signed)
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  She is 82 years old.  She reports good range of motion and strength and she is getting better every day.  She had become significant debilitated prior to this surgery given the severity of her left hip disease.  Her left hip moves much more smoothly today.  There is no blocks to rotation.  An AP pelvis lateral left hip shows a well-seated total hip arthroplasty with no complicating features.  From my standpoint we can see her back in 6 months.  She will continue to increase her activities as comfort allows.  She was working on a home exercise program for her spine which is stiff to her.  Dr. Oneida Alar gave her exercises to try.  When I do see her back in 6 months we will have just a standing low to pelvis.

## 2023-01-10 ENCOUNTER — Other Ambulatory Visit: Payer: Self-pay

## 2023-01-10 ENCOUNTER — Ambulatory Visit: Payer: Medicare PPO | Admitting: Sports Medicine

## 2023-01-10 VITALS — BP 110/60 | Ht 60.5 in | Wt 104.0 lb

## 2023-01-10 DIAGNOSIS — M75101 Unspecified rotator cuff tear or rupture of right shoulder, not specified as traumatic: Secondary | ICD-10-CM

## 2023-01-10 DIAGNOSIS — M12812 Other specific arthropathies, not elsewhere classified, left shoulder: Secondary | ICD-10-CM

## 2023-01-10 DIAGNOSIS — M25512 Pain in left shoulder: Secondary | ICD-10-CM

## 2023-01-10 DIAGNOSIS — M25511 Pain in right shoulder: Secondary | ICD-10-CM | POA: Diagnosis not present

## 2023-01-10 DIAGNOSIS — M75102 Unspecified rotator cuff tear or rupture of left shoulder, not specified as traumatic: Secondary | ICD-10-CM | POA: Diagnosis not present

## 2023-01-10 DIAGNOSIS — M12811 Other specific arthropathies, not elsewhere classified, right shoulder: Secondary | ICD-10-CM | POA: Diagnosis not present

## 2023-01-10 DIAGNOSIS — R269 Unspecified abnormalities of gait and mobility: Secondary | ICD-10-CM

## 2023-01-10 NOTE — Progress Notes (Signed)
Chief complaint bilateral shoulder pain with left occurring more acutely.  Evaluation of walking posterior hip replacement  Patient's chief concern is her left shoulder is now painful particularly with lifting. She was unsure of hurting this shoulder in any specific way.  She does have a history of falling off a horse onto her right shoulder and the right shoulder has been limited for years.  She did do physical therapy for the right shoulder but never regained full range of motion.  Left shoulder now feels more difficult to lift over her head.  She does get some pain at night.  Tylenol seems to help this.  Walking gait has steadily gotten better after hip replacement on the left.  However when she went back to try and do 3 miles a day she was having some mild pain.  For this reason she has kept her walking to about 1-1/2 miles.  When she uses a walking stick she feels more stable.  Physical exam Elderly but spry white female who is in no acute distress BP 110/60   Ht 5' 0.5" (1.537 m)   Wt 104 lb (47.2 kg)   BMI 19.98 kg/m   Both shoulders show significant limitation of full motion The right shoulder she gets abduction and elevation only to about 90 degrees.  Both internal and external rotation are limited. Strength is extremely weak on abduction but is probably normal for her on internal and external rotation.  Left shoulder gets abduction to about 120 degrees before she compensates by shifting her body position. Internal rotation and back scratch is tight and somewhat limited.  Left biceps testing shows a Popeye deformity. Strength testing is better than on the right on abduction and is probably normal for her age on internal and external rotation  Walking gait reveals that with the correction we made in her insole on the right she is walking with her hips and pelvis fairly level.  There is no Trendelenburg.  She does have fairly extreme supination which we have tried to correct to some  degree.  Basically her gait is somewhat improved versus the last time I evaluated her  Ultrasound of the right shoulder There is no clear definition of the right supraspinatus tendon and I suspect is retracted beneath the acromion.  There is some calcification noted. The subscapularis looks to be atrophied and perhaps retracted. Infraspinatus and teres minor muscles look intact. Glenohumeral head looks irregular. Biceps tendon is thinned and may be torn. 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  Arthritis of the glenohumeral joint and the AC joint.  Ultrasound and interpretation by Sibyl Parr. Darrick Penna, MD

## 2023-01-10 NOTE — Assessment & Plan Note (Signed)
With the current corrections it appears that her gait is more neutral.  I did not encouraged changing anything about her insoles. I believe she is safe for walking with the support of a walking stick. She can continue her daily walks and gradually increase those if tolerated.

## 2023-01-10 NOTE — Assessment & Plan Note (Signed)
I think her degree of shoulder arthritis and rotator cuff changes is too extensive to be aggressive I suggested trying to preserve her range of motion Since Tylenol helps for pain she can continue on that We can offer injection if it becomes too painful and even consider some formal physical therapy

## 2023-02-13 DIAGNOSIS — H401231 Low-tension glaucoma, bilateral, mild stage: Secondary | ICD-10-CM | POA: Diagnosis not present

## 2023-04-10 ENCOUNTER — Encounter (HOSPITAL_COMMUNITY): Payer: Self-pay

## 2023-04-10 ENCOUNTER — Ambulatory Visit (HOSPITAL_COMMUNITY)
Admission: RE | Admit: 2023-04-10 | Discharge: 2023-04-10 | Disposition: A | Discharge status: Home / Self Care | Payer: Medicare PPO | Source: Ambulatory Visit | Attending: Emergency Medicine | Admitting: Emergency Medicine

## 2023-04-10 VITALS — BP 110/68 | HR 88 | Temp 98.8°F | Resp 16 | Ht 60.0 in | Wt 100.0 lb

## 2023-04-10 DIAGNOSIS — J029 Acute pharyngitis, unspecified: Secondary | ICD-10-CM | POA: Diagnosis not present

## 2023-04-10 LAB — POCT RAPID STREP A (OFFICE): Rapid Strep A Screen: NEGATIVE

## 2023-04-10 MED ORDER — AMOXICILLIN 500 MG PO CAPS: 500.0000 mg | ORAL_CAPSULE | Freq: Two times a day (BID) | ORAL | 0 refills | Status: AC

## 2023-04-10 NOTE — ED Triage Notes (Signed)
Patient here today with c/o ST that started yesterday morning at 3 am. She took a Covid test at home and it was negative. Patient has some nasal congestion but not much. She has been taking ASA with some relief. No sick contacts. No recent travel.

## 2023-04-10 NOTE — ED Provider Notes (Signed)
[facility]    CSN: [id number] Arrival date & time: 04/10/23  1140      History   Chief Complaint Chief Complaint  Patient presents with   Sore Throat    HPI Jessica [name] is a 82 y.o. female. Woke up in middle of night yesterday with severe sore throat that has gotten worse. Does have some post nasal drainage, but no congestion, headache, cough, or ear pain. Throat pain is severe. Pt does not have tonsils. Deneis fever, reports chills. Has taken aspirin for pain, last dose this morning at 6am. Checked sself for covid at home and it was negative.    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   Chronic left hip pain 06/30/2022   Chronic pain of right ankle 06/16/2020   Pain in left lower leg 05/19/2020   Leg length discrepancy 05/29/2017   Gait abnormality 05/29/2017   Idiopathic scoliosis and kyphoscoliosis 05/29/2017    Past Surgical History:  Procedure Laterality Date   CARPAL TUNNEL RELEASE Bilateral 1998   CORONARY ANGIOPLASTY     EYE SURGERY Bilateral    cataract removal with Lens implants   FOOT TENDON SURGERY Bilateral 2010   TONSILLECTOMY     TOTAL HIP ARTHROPLASTY Left 09/16/2022   Procedure: LEFT TOTAL HIP ARTHROPLASTY ANTERIOR APPROACH;  Surgeon: Kathryne Hitch, MD;  Location: [facility];  Service: Orthopedics;  Laterality: Left;   TUBAL LIGATION      OB History   No obstetric history on file.      Home Medications    Prior to Admission medications   Medication Sig Start Date End Date  Taking? Authorizing Provider  amoxicillin (AMOXIL) 500 MG capsule Take 1 capsule (500 mg total) by mouth 2 (two) times daily for 10 days. 04/10/23 04/20/23 Yes Cathlyn Parsons, NP  aMILoride (MIDAMOR) 5 MG tablet Take 5 mg by mouth daily.    [provider]  aspirin 81 MG chewable tablet Chew 1 tablet (81 mg total) by mouth 2 (two) times daily. 09/17/22   Kathryne Hitch, MD  budesonide (ENTOCORT EC) 3 MG 24 hr capsule Take 3-9 mg by mouth See admin instructions. Take 9 mg daily for 6 weeks, then 6 mg daily for 2 weeks, then 3 mg daily for 2 weeks as needed for diarrhea    [provider]  ezetimibe (ZETIA) 10 MG tablet Take 1 tablet (10 mg total) by mouth daily. 09/13/22 09/08/23  Little Ishikawa, MD  HYDROcodone-acetaminophen (NORCO) 5-325 MG tablet Take 1-2 tablets by mouth every 6 (six) hours as needed for moderate pain. 09/17/22   Kathryne Hitch, MD  hydrocortisone cream 1 % Apply 1 Application topically 2 (two) times daily as needed for itching.    [provider]  latanoprost (XALATAN) 0.005 % ophthalmic solution Place 1 drop into both eyes at bedtime.    [provider]  levothyroxine (SYNTHROID) 112 MCG tablet Take 112 mcg by mouth  daily before breakfast.    [provider]  methocarbamol (ROBAXIN) 500 MG tablet Take 1 tablet (500 mg total) by mouth every 6 (six) hours as needed for muscle spasms. 09/17/22   Kathryne Hitch, MD  metoprolol succinate (TOPROL-XL) 25 MG 24 hr tablet Take 25 mg by mouth every evening. 03/09/17   [provider]  ramipril (ALTACE) 10 MG capsule Take 10 mg by mouth 2 (two) times daily.    [provider]  rosuvastatin (CRESTOR) 40 MG tablet Take 40 mg by mouth every evening.    [provider]    Family History History reviewed. No pertinent family history.  Social History Social History   Tobacco Use   Smoking status: Former    Types: Cigarettes   Smokeless  tobacco: Never  Vaping Use   Vaping status: Never Used  Substance Use Topics   Alcohol use: Yes    Alcohol/week: 14.0 standard drinks of alcohol    Types: 14 Glasses of wine per week   Drug use: Not Currently     Allergies   Gluten meal, Codeine, and Oxycodone   Review of Systems Review of Systems   Physical Exam Triage Vital Signs ED Triage Vitals  Encounter Vitals Group     BP 04/10/23 1207 110/68     Systolic BP Percentile --      Diastolic BP Percentile --      Pulse Rate 04/10/23 1207 88     Resp 04/10/23 1207 16     Temp 04/10/23 1207 98.8 F (37.1 C)     Temp Source 04/10/23 1207 Oral     SpO2 04/10/23 1207 96 %     Weight 04/10/23 1206 100 lb (45.4 kg)     Height 04/10/23 1206 5' (1.524 m)     Head Circumference --      Peak Flow --      Pain Score 04/10/23 1206 4     Pain Loc --      Pain Education --      Exclude from Growth Chart --    No data found.  Updated Vital Signs BP 110/68 (BP Location: Right Arm)   Pulse 88   Temp 98.8 F (37.1 C) (Oral)   Resp 16   Ht 5' (1.524 m)   Wt 100 lb (45.4 kg)   SpO2 96%   BMI 19.53 kg/m   Visual Acuity Right Eye Distance:   Left Eye Distance:   Bilateral Distance:    Right Eye Near:   Left Eye Near:    Bilateral Near:     Physical Exam Constitutional:      Appearance: She is well-developed.  HENT:     Right Ear: Tympanic membrane, ear canal and external ear normal.     Left Ear: Tympanic membrane, ear canal and external ear normal.     Nose: No congestion.     Mouth/Throat:     Mouth: Mucous membranes are moist.     Pharynx: Posterior oropharyngeal erythema present. No oropharyngeal exudate.     Comments: Tonsils absent Cardiovascular:     Rate and Rhythm: Normal rate and regular rhythm.  Pulmonary:     Effort: Pulmonary effort is normal.     Breath sounds: Normal breath sounds.  Neurological:     Mental Status: She is alert.      UC Treatments / Results  Labs (all labs ordered are  listed, but only abnormal results are displayed) Labs Reviewed  CULTURE,  GROUP A STREP [facility])  POCT RAPID STREP A (OFFICE)    EKG   Radiology No results found.  Procedures Procedures (including critical care time)  Medications Ordered in UC Medications - No data to display  Initial Impression / Assessment and Plan / UC Course  I have reviewed the triage vital signs and the nursing notes.  Pertinent labs & imaging results that were available during my care of the patient were reviewed by me and considered in my medical decision making (see chart for details).    POC strept is negative. Pharynx is quite red. Will treat for possible strep pharyngitis. Discussed supoportive care measures.   Final Clinical Impressions(s) / UC Diagnoses   Final diagnoses:  Pharyngitis, unspecified etiology     Discharge Instructions      Try gargling with salt water, using throat lozenges, or using throat sprays to help your pain. You can also take tylenol for pain or fever per package directions.   If you are congested at all, try using Mucinex and nasal saline spray.    ED Prescriptions     Medication Sig Dispense Auth. Provider   amoxicillin (AMOXIL) 500 MG capsule Take 1 capsule (500 mg total) by mouth 2 (two) times daily for 10 days. 20 capsule Cathlyn Parsons, NP      PDMP not reviewed this encounter.   Cathlyn Parsons, NP 04/10/23 1344

## 2023-04-10 NOTE — Discharge Instructions (Addendum)
Try gargling with salt water, using throat lozenges, or using throat sprays to help your pain. You can also take tylenol for pain or fever per package directions.   If you are congested at all, try using Mucinex and nasal saline spray.

## 2023-05-11 DIAGNOSIS — L089 Local infection of the skin and subcutaneous tissue, unspecified: Secondary | ICD-10-CM | POA: Diagnosis not present

## 2023-05-11 DIAGNOSIS — Z23 Encounter for immunization: Secondary | ICD-10-CM | POA: Diagnosis not present

## 2023-05-11 DIAGNOSIS — S80922A Unspecified superficial injury of left lower leg, initial encounter: Secondary | ICD-10-CM | POA: Diagnosis not present

## 2023-05-23 ENCOUNTER — Encounter (HOSPITAL_BASED_OUTPATIENT_CLINIC_OR_DEPARTMENT_OTHER): Payer: Medicare PPO | Attending: General Surgery | Admitting: General Surgery

## 2023-05-23 DIAGNOSIS — L97822 Non-pressure chronic ulcer of other part of left lower leg with fat layer exposed: Secondary | ICD-10-CM | POA: Diagnosis not present

## 2023-05-23 DIAGNOSIS — S81802A Unspecified open wound, left lower leg, initial encounter: Secondary | ICD-10-CM | POA: Diagnosis not present

## 2023-05-23 NOTE — Progress Notes (Addendum)
Jessica [name], Jessica [name] ([id number]) [id number] Page 1 of 8 Visit Report for 05/23/2023 Chief Complaint Document Details Patient Name: Date of Service: Jessica [name] 05/23/2023 8:00 A M Medical Record Number: [id number] Patient Account Number: [id number] Date of Birth/Sex: Treating RN: 1940-10-13 (82 y.o. F) Primary Care Provider: Ardean Larsen Other Clinician: Referring Provider: Treating Provider/Extender: Carolann Littler RKS, ISA A C Weeks in Treatment: 0 Information Obtained from: Patient Chief Complaint Patient seen for complaints of Non-Healing Wound. Electronic Signature(s) Signed: 05/23/2023 9:05:38 AM By: Duanne Guess MD FACS Previous Signature: 05/23/2023 8:13:35 AM Version By: Duanne Guess MD FACS Entered By: Duanne Guess on 05/23/2023 06:05:37 -------------------------------------------------------------------------------- Debridement Details Patient Name: Date of Service: Jessica Sos [name]. 05/23/2023 8:00 A M Medical Record Number: [id number] Patient Account Number: [id number] Date of Birth/Sex: Treating RN: Oct 20, 1940 (82 y.o. Jessica [name] Primary Care Provider: Ardean Larsen Other Clinician: Referring Provider: Treating Provider/Extender: Carolann Littler RKS, ISA A C Weeks in Treatment: 0 Debridement Performed for Assessment: Wound #1 Left Lower Leg Performed By: Physician Duanne Guess, MD The following information was scribed by: Karie Schwalbe The information was scribed for: Duanne Guess Debridement Type: Debridement Level of Consciousness (Pre-procedure): Awake and Alert Pre-procedure Verification/Time Out Yes - 08:46 Taken: Start Time: 08:46 Pain Control: Lidocaine 4% T opical Solution Percent of Wound Bed Debrided: 100% T Area Debrided (cm): otal 1.06 Tissue and other material debrided: Non-Viable, Slough, Slough Level: Non-Viable Tissue Debridement Description:  Selective/Open Wound Instrument: Curette Bleeding: Minimum Hemostasis Achieved: Pressure End Time: 08:48 Procedural Pain: 0 Post Procedural Pain: 0 Response to Treatment: Procedure was tolerated well Level of Consciousness (Post- Awake and Alert procedure): Post Debridement Measurements of Total Wound Length: (cm) 1.5 Width: (cm) 0.9 Depth: (cm) 0.1 Jessica [name], Jessica [name] ([id number]) [id number] Page 2 of 8 Volume: (cm) 0.106 Character of Wound/Ulcer Post Debridement: Improved Post Procedure Diagnosis Same as Pre-procedure Electronic Signature(s) Signed: 05/23/2023 10:29:38 AM By: Karie Schwalbe RN Signed: 05/23/2023 10:42:46 AM By: Duanne Guess MD FACS Entered By: Karie Schwalbe on 05/23/2023 05:53:40 -------------------------------------------------------------------------------- HPI Details Patient Name: Date of Service: Jessica Sos [name]. 05/23/2023 8:00 A M Medical Record Number: [id number] Patient Account Number: [id number] Date of Birth/Sex: Treating RN: June 19, 1941 (82 y.o. F) Primary Care Provider: Ardean Larsen Other Clinician: Referring Provider: Treating Provider/Extender: Carolann Littler RKS, ISA A C Weeks in Treatment: 0 History of Present Illness HPI Description: ADMISSION 05/23/2023 ***PATIENT UNABLE TO TOLERATE CUFFS FOR ABI*** This is a relatively healthy 82 year old woman, non-smoker and nondiabetic, with primarily orthopedic issues in her past medical history. She was referred to the clinic today by her primary care doctor to address a left lower leg wound. The wound apparently occurred in May while she was gardening. She is uncertain exactly how it happened but perhaps struck her leg on a stick or similar protruding structure. She completed a course of both clindamycin and Augmentin based upon a wound swab culture that grew methicillin sensitive Staph aureus. She has been applying some triamcinolone cream that she  had from a previous wound care admission at the [facility] wound care center. Electronic Signature(s) Signed: 05/23/2023 9:07:27 AM By: Duanne Guess MD FACS Previous Signature: 05/23/2023 8:19:12 AM Version By: Duanne Guess MD FACS Entered By: Duanne Guess on 05/23/2023 06:07:26 -------------------------------------------------------------------------------- Physical Exam Details Patient Name: Date of Service: Jessica Sos [name]. 05/23/2023 8:00 A M Medical Record Number: [id number] Patient Account Number: [id number] Date of Birth/Sex:  Jessica [name], Jessica [name] ([id number]) [id number] Page 1 of 8 Visit Report for 05/23/2023 Chief Complaint Document Details Patient Name: Date of Service: Jessica [name] 05/23/2023 8:00 A M Medical Record Number: [id number] Patient Account Number: [id number] Date of Birth/Sex: Treating RN: 1940-10-13 (82 y.o. F) Primary Care Provider: Ardean Larsen Other Clinician: Referring Provider: Treating Provider/Extender: Carolann Littler RKS, ISA A C Weeks in Treatment: 0 Information Obtained from: Patient Chief Complaint Patient seen for complaints of Non-Healing Wound. Electronic Signature(s) Signed: 05/23/2023 9:05:38 AM By: Duanne Guess MD FACS Previous Signature: 05/23/2023 8:13:35 AM Version By: Duanne Guess MD FACS Entered By: Duanne Guess on 05/23/2023 06:05:37 -------------------------------------------------------------------------------- Debridement Details Patient Name: Date of Service: Jessica Sos [name]. 05/23/2023 8:00 A M Medical Record Number: [id number] Patient Account Number: [id number] Date of Birth/Sex: Treating RN: Oct 20, 1940 (82 y.o. Jessica [name] Primary Care Provider: Ardean Larsen Other Clinician: Referring Provider: Treating Provider/Extender: Carolann Littler RKS, ISA A C Weeks in Treatment: 0 Debridement Performed for Assessment: Wound #1 Left Lower Leg Performed By: Physician Duanne Guess, MD The following information was scribed by: Karie Schwalbe The information was scribed for: Duanne Guess Debridement Type: Debridement Level of Consciousness (Pre-procedure): Awake and Alert Pre-procedure Verification/Time Out Yes - 08:46 Taken: Start Time: 08:46 Pain Control: Lidocaine 4% T opical Solution Percent of Wound Bed Debrided: 100% T Area Debrided (cm): otal 1.06 Tissue and other material debrided: Non-Viable, Slough, Slough Level: Non-Viable Tissue Debridement Description:  Selective/Open Wound Instrument: Curette Bleeding: Minimum Hemostasis Achieved: Pressure End Time: 08:48 Procedural Pain: 0 Post Procedural Pain: 0 Response to Treatment: Procedure was tolerated well Level of Consciousness (Post- Awake and Alert procedure): Post Debridement Measurements of Total Wound Length: (cm) 1.5 Width: (cm) 0.9 Depth: (cm) 0.1 Jessica [name], Jessica [name] ([id number]) [id number] Page 2 of 8 Volume: (cm) 0.106 Character of Wound/Ulcer Post Debridement: Improved Post Procedure Diagnosis Same as Pre-procedure Electronic Signature(s) Signed: 05/23/2023 10:29:38 AM By: Karie Schwalbe RN Signed: 05/23/2023 10:42:46 AM By: Duanne Guess MD FACS Entered By: Karie Schwalbe on 05/23/2023 05:53:40 -------------------------------------------------------------------------------- HPI Details Patient Name: Date of Service: Jessica Sos [name]. 05/23/2023 8:00 A M Medical Record Number: [id number] Patient Account Number: [id number] Date of Birth/Sex: Treating RN: June 19, 1941 (82 y.o. F) Primary Care Provider: Ardean Larsen Other Clinician: Referring Provider: Treating Provider/Extender: Carolann Littler RKS, ISA A C Weeks in Treatment: 0 History of Present Illness HPI Description: ADMISSION 05/23/2023 ***PATIENT UNABLE TO TOLERATE CUFFS FOR ABI*** This is a relatively healthy 82 year old woman, non-smoker and nondiabetic, with primarily orthopedic issues in her past medical history. She was referred to the clinic today by her primary care doctor to address a left lower leg wound. The wound apparently occurred in May while she was gardening. She is uncertain exactly how it happened but perhaps struck her leg on a stick or similar protruding structure. She completed a course of both clindamycin and Augmentin based upon a wound swab culture that grew methicillin sensitive Staph aureus. She has been applying some triamcinolone cream that she  had from a previous wound care admission at the [facility] wound care center. Electronic Signature(s) Signed: 05/23/2023 9:07:27 AM By: Duanne Guess MD FACS Previous Signature: 05/23/2023 8:19:12 AM Version By: Duanne Guess MD FACS Entered By: Duanne Guess on 05/23/2023 06:07:26 -------------------------------------------------------------------------------- Physical Exam Details Patient Name: Date of Service: Jessica Sos [name]. 05/23/2023 8:00 A M Medical Record Number: [id number] Patient Account Number: [id number] Date of Birth/Sex:  [id number] Date of Birth/Sex: Treating RN: 01/25/1941 (83 y.o. F) Primary Care Provider: Ardean Larsen Other Clinician: Referring Provider: Treating Provider/Extender: Carolann Littler RKS, ISA A C Weeks in Treatment: 0 Active Problems ICD-10 Encounter Code Description Active Date MDM Diagnosis L97.822 Non-pressure chronic ulcer of other part of left lower leg with fat layer exposed9/24/2024 No Yes Inactive Problems Resolved Problems Electronic Signature(s) Signed: 05/23/2023 8:06:44 AM By: Duanne Guess MD FACS Entered By: Duanne Guess on 05/23/2023 05:06:44 -------------------------------------------------------------------------------- Progress Note Details Patient Name: Date of Service: Jessica Sos [name]. 05/23/2023 8:00 A M Medical Record Number: [id number] Patient Account Number: [id number] Date of Birth/Sex: Treating RN: [birth year] (82 y.o. F) Primary Care Provider: Ardean Larsen Other Clinician: Referring Provider: Treating Provider/Extender: Carolann Littler RKS, ISA A C Weeks in Treatment: 0 Subjective Chief Complaint Information obtained from Patient Patient seen for complaints of  Non-Healing Wound. History of Present Illness (HPI) ADMISSION 05/23/2023 ***PATIENT UNABLE TO TOLERATE CUFFS FOR ABI*** This is a relatively healthy 82 year old woman, non-smoker and nondiabetic, with primarily orthopedic issues in her past medical history. She was referred to the clinic today by her primary care doctor to address a left lower leg wound. The wound apparently occurred in May while she was gardening. She is uncertain exactly how it happened but perhaps struck her leg on a stick or similar protruding structure. She completed a course of both clindamycin and Augmentin based Jessica [name], Jessica [name] ([id number]) [id number] Page 5 of 8 upon a wound swab culture that grew methicillin sensitive Staph aureus. She has been applying some triamcinolone cream that she had from a previous wound care admission at the [facility] wound care center. Patient History Information obtained from Patient. Allergies Gluten Protein (Severity: Moderate) Family History Unknown History. Social History Former smoker - ended on 08/30/1975, Marital Status - Divorced, Alcohol Use - Rarely - wine, Drug Use - No History, Caffeine Use - Daily - coffee1/1/19977. Medical History Eyes Patient has history of Glaucoma Cardiovascular Patient has history of Hypertension Hospitalization/Surgery History - Left Hip Replacement. - Bilateral feet Bunion and Hammer toe surgeries. - Bilateral Carpal tunnel repair. Medical A Surgical History Notes nd Cardiovascular Hx: TIA; Coronary artery disease with angina pectoris Endocrine Hx: Hypothyroidism Musculoskeletal Hx: Scoliosis; Left foot tendon rupture Oncologic Hx: Basal cell carcinoma ( Right cheek 25 years ago) Review of Systems (ROS) Constitutional Symptoms (General Health) Denies complaints or symptoms of Fatigue, Fever, Chills, Marked Weight Change. Ear/Nose/Mouth/Throat Denies complaints or symptoms of Chronic sinus problems or  rhinitis. Integumentary (Skin) Complains or has symptoms of Wounds - Left Anterior lower leg wound. Objective Constitutional Slightly hypertensive. No acute distress. Vitals Time Taken: 8:06 AM, Height: 60 in, Source: Stated, Weight: 98 lbs, Source: Stated, BMI: 19.1, Temperature: 97.5 F, Pulse: 67 bpm, Respiratory Rate: 18 breaths/min, Blood Pressure: 141/70 mmHg. Respiratory Normal work of breathing on room air. General Notes: 05/23/2023: On her left anterior tibial surface, there is an oval wound with slough on the surface and some small buds of granulation tissue beginning to emerge. The skin edges are starting to roll. Integumentary (Hair, Skin) Wound #1 status is Open. Original cause of wound was Trauma. The date acquired was: 12/28/2022. The wound is located on the Left Lower Leg. The wound measures 1.5cm length x 0.9cm width x 0.1cm depth; 1.06cm^2 area and 0.106cm^3 volume. There is Fat Layer (Subcutaneous Tissue) exposed. There is no tunneling or undermining noted. There is a medium amount of serosanguineous drainage noted. The wound margin is distinct  Jessica [name], Jessica [name] ([id number]) [id number] Page 1 of 8 Visit Report for 05/23/2023 Chief Complaint Document Details Patient Name: Date of Service: Jessica [name] 05/23/2023 8:00 A M Medical Record Number: [id number] Patient Account Number: [id number] Date of Birth/Sex: Treating RN: 1940-10-13 (82 y.o. F) Primary Care Provider: Ardean Larsen Other Clinician: Referring Provider: Treating Provider/Extender: Carolann Littler RKS, ISA A C Weeks in Treatment: 0 Information Obtained from: Patient Chief Complaint Patient seen for complaints of Non-Healing Wound. Electronic Signature(s) Signed: 05/23/2023 9:05:38 AM By: Duanne Guess MD FACS Previous Signature: 05/23/2023 8:13:35 AM Version By: Duanne Guess MD FACS Entered By: Duanne Guess on 05/23/2023 06:05:37 -------------------------------------------------------------------------------- Debridement Details Patient Name: Date of Service: Jessica Sos [name]. 05/23/2023 8:00 A M Medical Record Number: [id number] Patient Account Number: [id number] Date of Birth/Sex: Treating RN: Oct 20, 1940 (82 y.o. Jessica [name] Primary Care Provider: Ardean Larsen Other Clinician: Referring Provider: Treating Provider/Extender: Carolann Littler RKS, ISA A C Weeks in Treatment: 0 Debridement Performed for Assessment: Wound #1 Left Lower Leg Performed By: Physician Duanne Guess, MD The following information was scribed by: Karie Schwalbe The information was scribed for: Duanne Guess Debridement Type: Debridement Level of Consciousness (Pre-procedure): Awake and Alert Pre-procedure Verification/Time Out Yes - 08:46 Taken: Start Time: 08:46 Pain Control: Lidocaine 4% T opical Solution Percent of Wound Bed Debrided: 100% T Area Debrided (cm): otal 1.06 Tissue and other material debrided: Non-Viable, Slough, Slough Level: Non-Viable Tissue Debridement Description:  Selective/Open Wound Instrument: Curette Bleeding: Minimum Hemostasis Achieved: Pressure End Time: 08:48 Procedural Pain: 0 Post Procedural Pain: 0 Response to Treatment: Procedure was tolerated well Level of Consciousness (Post- Awake and Alert procedure): Post Debridement Measurements of Total Wound Length: (cm) 1.5 Width: (cm) 0.9 Depth: (cm) 0.1 Jessica [name], Jessica [name] ([id number]) [id number] Page 2 of 8 Volume: (cm) 0.106 Character of Wound/Ulcer Post Debridement: Improved Post Procedure Diagnosis Same as Pre-procedure Electronic Signature(s) Signed: 05/23/2023 10:29:38 AM By: Karie Schwalbe RN Signed: 05/23/2023 10:42:46 AM By: Duanne Guess MD FACS Entered By: Karie Schwalbe on 05/23/2023 05:53:40 -------------------------------------------------------------------------------- HPI Details Patient Name: Date of Service: Jessica Sos [name]. 05/23/2023 8:00 A M Medical Record Number: [id number] Patient Account Number: [id number] Date of Birth/Sex: Treating RN: June 19, 1941 (82 y.o. F) Primary Care Provider: Ardean Larsen Other Clinician: Referring Provider: Treating Provider/Extender: Carolann Littler RKS, ISA A C Weeks in Treatment: 0 History of Present Illness HPI Description: ADMISSION 05/23/2023 ***PATIENT UNABLE TO TOLERATE CUFFS FOR ABI*** This is a relatively healthy 82 year old woman, non-smoker and nondiabetic, with primarily orthopedic issues in her past medical history. She was referred to the clinic today by her primary care doctor to address a left lower leg wound. The wound apparently occurred in May while she was gardening. She is uncertain exactly how it happened but perhaps struck her leg on a stick or similar protruding structure. She completed a course of both clindamycin and Augmentin based upon a wound swab culture that grew methicillin sensitive Staph aureus. She has been applying some triamcinolone cream that she  had from a previous wound care admission at the [facility] wound care center. Electronic Signature(s) Signed: 05/23/2023 9:07:27 AM By: Duanne Guess MD FACS Previous Signature: 05/23/2023 8:19:12 AM Version By: Duanne Guess MD FACS Entered By: Duanne Guess on 05/23/2023 06:07:26 -------------------------------------------------------------------------------- Physical Exam Details Patient Name: Date of Service: Jessica Sos [name]. 05/23/2023 8:00 A M Medical Record Number: [id number] Patient Account Number: [id number] Date of Birth/Sex:  [id number] Date of Birth/Sex: Treating RN: 01/25/1941 (83 y.o. F) Primary Care Provider: Ardean Larsen Other Clinician: Referring Provider: Treating Provider/Extender: Carolann Littler RKS, ISA A C Weeks in Treatment: 0 Active Problems ICD-10 Encounter Code Description Active Date MDM Diagnosis L97.822 Non-pressure chronic ulcer of other part of left lower leg with fat layer exposed9/24/2024 No Yes Inactive Problems Resolved Problems Electronic Signature(s) Signed: 05/23/2023 8:06:44 AM By: Duanne Guess MD FACS Entered By: Duanne Guess on 05/23/2023 05:06:44 -------------------------------------------------------------------------------- Progress Note Details Patient Name: Date of Service: Jessica Sos [name]. 05/23/2023 8:00 A M Medical Record Number: [id number] Patient Account Number: [id number] Date of Birth/Sex: Treating RN: [birth year] (82 y.o. F) Primary Care Provider: Ardean Larsen Other Clinician: Referring Provider: Treating Provider/Extender: Carolann Littler RKS, ISA A C Weeks in Treatment: 0 Subjective Chief Complaint Information obtained from Patient Patient seen for complaints of  Non-Healing Wound. History of Present Illness (HPI) ADMISSION 05/23/2023 ***PATIENT UNABLE TO TOLERATE CUFFS FOR ABI*** This is a relatively healthy 82 year old woman, non-smoker and nondiabetic, with primarily orthopedic issues in her past medical history. She was referred to the clinic today by her primary care doctor to address a left lower leg wound. The wound apparently occurred in May while she was gardening. She is uncertain exactly how it happened but perhaps struck her leg on a stick or similar protruding structure. She completed a course of both clindamycin and Augmentin based Jessica [name], Jessica [name] ([id number]) [id number] Page 5 of 8 upon a wound swab culture that grew methicillin sensitive Staph aureus. She has been applying some triamcinolone cream that she had from a previous wound care admission at the [facility] wound care center. Patient History Information obtained from Patient. Allergies Gluten Protein (Severity: Moderate) Family History Unknown History. Social History Former smoker - ended on 08/30/1975, Marital Status - Divorced, Alcohol Use - Rarely - wine, Drug Use - No History, Caffeine Use - Daily - coffee1/1/19977. Medical History Eyes Patient has history of Glaucoma Cardiovascular Patient has history of Hypertension Hospitalization/Surgery History - Left Hip Replacement. - Bilateral feet Bunion and Hammer toe surgeries. - Bilateral Carpal tunnel repair. Medical A Surgical History Notes nd Cardiovascular Hx: TIA; Coronary artery disease with angina pectoris Endocrine Hx: Hypothyroidism Musculoskeletal Hx: Scoliosis; Left foot tendon rupture Oncologic Hx: Basal cell carcinoma ( Right cheek 25 years ago) Review of Systems (ROS) Constitutional Symptoms (General Health) Denies complaints or symptoms of Fatigue, Fever, Chills, Marked Weight Change. Ear/Nose/Mouth/Throat Denies complaints or symptoms of Chronic sinus problems or  rhinitis. Integumentary (Skin) Complains or has symptoms of Wounds - Left Anterior lower leg wound. Objective Constitutional Slightly hypertensive. No acute distress. Vitals Time Taken: 8:06 AM, Height: 60 in, Source: Stated, Weight: 98 lbs, Source: Stated, BMI: 19.1, Temperature: 97.5 F, Pulse: 67 bpm, Respiratory Rate: 18 breaths/min, Blood Pressure: 141/70 mmHg. Respiratory Normal work of breathing on room air. General Notes: 05/23/2023: On her left anterior tibial surface, there is an oval wound with slough on the surface and some small buds of granulation tissue beginning to emerge. The skin edges are starting to roll. Integumentary (Hair, Skin) Wound #1 status is Open. Original cause of wound was Trauma. The date acquired was: 12/28/2022. The wound is located on the Left Lower Leg. The wound measures 1.5cm length x 0.9cm width x 0.1cm depth; 1.06cm^2 area and 0.106cm^3 volume. There is Fat Layer (Subcutaneous Tissue) exposed. There is no tunneling or undermining noted. There is a medium amount of serosanguineous drainage noted. The wound margin is distinct  [id number] Date of Birth/Sex: Treating RN: 01/25/1941 (83 y.o. F) Primary Care Provider: Ardean Larsen Other Clinician: Referring Provider: Treating Provider/Extender: Carolann Littler RKS, ISA A C Weeks in Treatment: 0 Active Problems ICD-10 Encounter Code Description Active Date MDM Diagnosis L97.822 Non-pressure chronic ulcer of other part of left lower leg with fat layer exposed9/24/2024 No Yes Inactive Problems Resolved Problems Electronic Signature(s) Signed: 05/23/2023 8:06:44 AM By: Duanne Guess MD FACS Entered By: Duanne Guess on 05/23/2023 05:06:44 -------------------------------------------------------------------------------- Progress Note Details Patient Name: Date of Service: Jessica Sos [name]. 05/23/2023 8:00 A M Medical Record Number: [id number] Patient Account Number: [id number] Date of Birth/Sex: Treating RN: [birth year] (82 y.o. F) Primary Care Provider: Ardean Larsen Other Clinician: Referring Provider: Treating Provider/Extender: Carolann Littler RKS, ISA A C Weeks in Treatment: 0 Subjective Chief Complaint Information obtained from Patient Patient seen for complaints of  Non-Healing Wound. History of Present Illness (HPI) ADMISSION 05/23/2023 ***PATIENT UNABLE TO TOLERATE CUFFS FOR ABI*** This is a relatively healthy 82 year old woman, non-smoker and nondiabetic, with primarily orthopedic issues in her past medical history. She was referred to the clinic today by her primary care doctor to address a left lower leg wound. The wound apparently occurred in May while she was gardening. She is uncertain exactly how it happened but perhaps struck her leg on a stick or similar protruding structure. She completed a course of both clindamycin and Augmentin based Jessica [name], Jessica [name] ([id number]) [id number] Page 5 of 8 upon a wound swab culture that grew methicillin sensitive Staph aureus. She has been applying some triamcinolone cream that she had from a previous wound care admission at the [facility] wound care center. Patient History Information obtained from Patient. Allergies Gluten Protein (Severity: Moderate) Family History Unknown History. Social History Former smoker - ended on 08/30/1975, Marital Status - Divorced, Alcohol Use - Rarely - wine, Drug Use - No History, Caffeine Use - Daily - coffee1/1/19977. Medical History Eyes Patient has history of Glaucoma Cardiovascular Patient has history of Hypertension Hospitalization/Surgery History - Left Hip Replacement. - Bilateral feet Bunion and Hammer toe surgeries. - Bilateral Carpal tunnel repair. Medical A Surgical History Notes nd Cardiovascular Hx: TIA; Coronary artery disease with angina pectoris Endocrine Hx: Hypothyroidism Musculoskeletal Hx: Scoliosis; Left foot tendon rupture Oncologic Hx: Basal cell carcinoma ( Right cheek 25 years ago) Review of Systems (ROS) Constitutional Symptoms (General Health) Denies complaints or symptoms of Fatigue, Fever, Chills, Marked Weight Change. Ear/Nose/Mouth/Throat Denies complaints or symptoms of Chronic sinus problems or  rhinitis. Integumentary (Skin) Complains or has symptoms of Wounds - Left Anterior lower leg wound. Objective Constitutional Slightly hypertensive. No acute distress. Vitals Time Taken: 8:06 AM, Height: 60 in, Source: Stated, Weight: 98 lbs, Source: Stated, BMI: 19.1, Temperature: 97.5 F, Pulse: 67 bpm, Respiratory Rate: 18 breaths/min, Blood Pressure: 141/70 mmHg. Respiratory Normal work of breathing on room air. General Notes: 05/23/2023: On her left anterior tibial surface, there is an oval wound with slough on the surface and some small buds of granulation tissue beginning to emerge. The skin edges are starting to roll. Integumentary (Hair, Skin) Wound #1 status is Open. Original cause of wound was Trauma. The date acquired was: 12/28/2022. The wound is located on the Left Lower Leg. The wound measures 1.5cm length x 0.9cm width x 0.1cm depth; 1.06cm^2 area and 0.106cm^3 volume. There is Fat Layer (Subcutaneous Tissue) exposed. There is no tunneling or undermining noted. There is a medium amount of serosanguineous drainage noted. The wound margin is distinct

## 2023-05-23 NOTE — Progress Notes (Signed)
Jessica [name], Jessica [name] ([id number]) [id number] Page 1 of 9 Visit Report for 05/23/2023 Allergy List Details Patient Name: Date of Service: Jessica Sos [name]. 05/23/2023 8:00 A M Medical Record Number: [id number] Patient Account Number: [id number] Date of Birth/Sex: Treating RN: 04-16-1941 (82 y.o. Jessica [name] Primary Care Adesuwa Osgood: Ardean Larsen Other Clinician: Referring Yuvan Medinger: Treating Maycel Riffe/Extender: Carolann Littler RKS, ISA A C Weeks in Treatment: 0 Allergies Active Allergies Gluten Protein Severity: Moderate Allergy Notes Electronic Signature(s) Signed: 05/23/2023 10:29:38 AM By: Karie Schwalbe RN Entered By: Karie Schwalbe on 05/23/2023 08:16:01 -------------------------------------------------------------------------------- Arrival Information Details Patient Name: Date of Service: Jessica Sos [name]. 05/23/2023 8:00 A M Medical Record Number: [id number] Patient Account Number: [id number] Date of Birth/Sex: Treating RN: May 25, 1941 (82 y.o. F) Primary Care Alaynah Schutter: Ardean Larsen Other Clinician: Referring Zaira Iacovelli: Treating Victorina Kable/Extender: Carolann Littler RKS, ISA A C Weeks in Treatment: 0 Visit Information Patient Arrived: Cane Arrival Time: 08:05 Accompanied By: self Transfer Assistance: None Patient Identification Verified: Yes Secondary Verification Process Completed: Yes Patient Has Alerts: Yes Patient Alerts: Unable to complete ABI L Electronic Signature(s) Signed: 05/23/2023 10:29:38 AM By: Karie Schwalbe RN Entered By: Karie Schwalbe on 05/23/2023 08:43:00 -------------------------------------------------------------------------------- Clinic Level of Care Assessment Details Patient Name: Date of Service: Jessica Sos [name]. 05/23/2023 8:00 A Jessica [name], Jessica [name] ([id number]) [phone].pdf Page 2 of 9 Medical Record Number: [id number] Patient Account Number:  [id number] Date of Birth/Sex: Treating RN: [birth year] (82 y.o. Jessica [name] Primary Care Endrit Gittins: Ardean Larsen Other Clinician: Referring Maykayla Highley: Treating Niambi Smoak/Extender: Carolann Littler RKS, ISA A C Weeks in Treatment: 0 Clinic Level of Care Assessment Items TOOL 1 Quantity Score X- 1 0 Use when EandM and Procedure is performed on INITIAL visit ASSESSMENTS - Nursing Assessment / Reassessment X- 1 20 General Physical Exam (combine w/ comprehensive assessment (listed just below) when performed on new pt. evals) X- 1 25 Comprehensive Assessment (HX, ROS, Risk Assessments, Wounds Hx, etc.) ASSESSMENTS - Wound and Skin Assessment / Reassessment X- 1 10 Dermatologic / Skin Assessment (not related to wound area) ASSESSMENTS - Ostomy and/or Continence Assessment and Care []  - 0 Incontinence Assessment and Management []  - 0 Ostomy Care Assessment and Management (repouching, etc.) PROCESS - Coordination of Care X - Simple Patient / Family Education for ongoing care 1 15 []  - 0 Complex (extensive) Patient / Family Education for ongoing care X- 1 10 Staff obtains Chiropractor, Records, T Results / Process Orders est X- 1 10 Staff telephones HHA, Nursing Homes / Clarify orders / etc []  - 0 Routine Transfer to another Facility (non-emergent condition) []  - 0 Routine Hospital Admission (non-emergent condition) X- 1 15 New Admissions / Manufacturing engineer / Ordering NPWT Apligraf, etc. , []  - 0 Emergency Hospital Admission (emergent condition) PROCESS - Special Needs []  - 0 Pediatric / Minor Patient Management []  - 0 Isolation Patient Management []  - 0 Hearing / Language / Visual special needs []  - 0 Assessment of Community assistance (transportation, D/C planning, etc.) []  - 0 Additional assistance / Altered mentation []  - 0 Support Surface(s) Assessment (bed, cushion, seat, etc.) INTERVENTIONS - Miscellaneous []  - 0 External ear exam []  - 0 Patient  Transfer (multiple staff / Nurse, adult / Similar devices) []  - 0 Simple Staple / Suture removal (25 or less) []  - 0 Complex Staple / Suture removal (26 or more) []  - 0 Hypo/Hyperglycemic Management (do not check if billed separately) []  - 0 Ankle /  Brachial Index (ABI) - do not check if billed separately Has the patient been seen at the hospital within the last three years: Yes Total Score: 105 Level Of Care: New/Established - Level 3 Electronic Signature(s) Signed: 05/23/2023 10:29:38 AM By: Karie Schwalbe RN Entered By: Karie Schwalbe on 05/23/2023 09:26:49 Jessica [name] ([id number]) [id number] Page 3 of 9 -------------------------------------------------------------------------------- Encounter Discharge Information Details Patient Name: Date of Service: Jessica Sos [name]. 05/23/2023 8:00 A M Medical Record Number: [id number] Patient Account Number: [id number] Date of Birth/Sex: Treating RN: 06-29-1941 (82 y.o. Jessica [name] Primary Care Ilir Mahrt: Ardean Larsen Other Clinician: Referring Garo Heidelberg: Treating Jennier Schissler/Extender: Carolann Littler RKS, ISA A C Weeks in Treatment: 0 Encounter Discharge Information Items Post Procedure Vitals Discharge Condition: Stable Temperature (F): 97.5 Ambulatory Status: Cane Pulse (bpm): 67 Discharge Destination: Home Respiratory Rate (breaths/min): 18 Transportation: Private Auto Blood Pressure (mmHg): 141/70 Accompanied By: self Schedule Follow-up Appointment: Yes Clinical Summary of Care: Patient Declined Electronic Signature(s) Signed: 05/23/2023 10:29:38 AM By: Karie Schwalbe RN Entered By: Karie Schwalbe on 05/23/2023 10:27:58 -------------------------------------------------------------------------------- Lower Extremity Assessment Details Patient Name: Date of Service: Jessica Sos [name]. 05/23/2023 8:00 A M Medical Record Number: [id number] Patient Account Number:  [id number] Date of Birth/Sex: Treating RN: Apr 15, 1941 (82 y.o. Jessica [name] Primary Care Daris Harkins: Ardean Larsen Other Clinician: Referring Jobany Montellano: Treating Arlyn Buerkle/Extender: Carolann Littler RKS, ISA A C Weeks in Treatment: 0 Edema Assessment Assessed: [Left: No] [Right: No] Edema: [Left: N] [Right: o] Calf Left: Right: Point of Measurement: 30 cm From Medial Instep 30 cm Ankle Left: Right: Point of Measurement: 9 cm From Medial Instep 17 cm Knee To Floor Left: Right: From Medial Instep 37 cm Vascular Assessment Pulses: Dorsalis Pedis Palpable: [Left:Yes] Posterior Tibial Palpable: [Left:Yes] Extremity colors, hair growth, and conditions: Extremity Color: [Left:Normal] Temperature of Extremity: [Left:Warm] Capillary Refill: [Left:< 3 seconds] Horsfall, Tracye [name] ([id number]) [Right:[id number] Page 4 of 9] Dependent Rubor: [Left:No] Blanched when Elevated: [Left:No No] Toe Nail Assessment Left: Right: Thick: No Discolored: No Deformed: No Improper Length and Hygiene: No Notes Unable to get Left ABI-painful for pt. Electronic Signature(s) Signed: 05/23/2023 10:29:38 AM By: Karie Schwalbe RN Entered By: Karie Schwalbe on 05/23/2023 08:40:26 -------------------------------------------------------------------------------- Multi Wound Chart Details Patient Name: Date of Service: Jessica Sos [name]. 05/23/2023 8:00 A M Medical Record Number: [id number] Patient Account Number: [id number] Date of Birth/Sex: Treating RN: 1941-04-13 (82 y.o. F) Primary Care Susa Bones: Ardean Larsen Other Clinician: Referring Shelly Shoultz: Treating Matti Killingsworth/Extender: Carolann Littler RKS, ISA A C Weeks in Treatment: 0 Vital Signs Height(in): 60 Pulse(bpm): 67 Weight(lbs): 98 Blood Pressure(mmHg): 141/70 Body Mass Index(BMI): 19.1 Temperature(F): 97.5 Respiratory Rate(breaths/min): 18 [1:Photos:] [N/A:N/A] Left Lower Leg N/A N/A Wound  Location: Trauma N/A N/A Wounding Event: Trauma, Other N/A N/A Primary Etiology: Glaucoma, Hypertension N/A N/A Comorbid History: 12/28/2022 N/A N/A Date Acquired: 0 N/A N/A Weeks of Treatment: Open N/A N/A Wound Status: No N/A N/A Wound Recurrence: 1.5x0.9x0.1 N/A N/A Measurements L x W x D (cm) 1.06 N/A N/A A (cm) : rea 0.106 N/A N/A Volume (cm) : Full Thickness Without Exposed N/A N/A Classification: Support Structures Medium N/A N/A Exudate A mount: Serosanguineous N/A N/A Exudate Type: red, brown N/A N/A Exudate Color: Distinct, outline attached N/A N/A Wound Margin: Small (1-33%) N/A N/A Granulation Amount: Pink N/A N/A Granulation Quality: Large (67-100%) N/A N/A Necrotic Amount: Eschar, Adherent Slough N/A N/A Necrotic Tissue: Fat Layer (Subcutaneous Tissue): Yes N/A N/A Exposed Structures:  Fascia: No Jessica [name], Jessica [name] ([id number]) [id number] Page 5 of 9 Tendon: No Muscle: No Joint: No Bone: No None N/A N/A Epithelialization: Debridement - Selective/Open Wound N/A N/A Debridement: Pre-procedure Verification/Time Out 08:46 N/A N/A Taken: Lidocaine 4% Topical Solution N/A N/A Pain Control: Slough N/A N/A Tissue Debrided: Non-Viable Tissue N/A N/A Level: 1.06 N/A N/A Debridement A (sq cm): rea Curette N/A N/A Instrument: Minimum N/A N/A Bleeding: Pressure N/A N/A Hemostasis A chieved: 0 N/A N/A Procedural Pain: 0 N/A N/A Post Procedural Pain: Procedure was tolerated well N/A N/A Debridement Treatment Response: 1.5x0.9x0.1 N/A N/A Post Debridement Measurements L x W x D (cm) 0.106 N/A N/A Post Debridement Volume: (cm) No Abnormalities Noted N/A N/A Periwound Skin Texture: Maceration: No N/A N/A Periwound Skin Moisture: Dry/Scaly: No No Abnormalities Noted N/A N/A Periwound Skin Color: No Abnormality N/A N/A Temperature: Debridement N/A N/A Procedures Performed: Treatment Notes Electronic  Signature(s) Signed: 05/23/2023 9:05:30 AM By: Duanne Guess MD FACS Entered By: Duanne Guess on 05/23/2023 09:05:30 -------------------------------------------------------------------------------- Multi-Disciplinary Care Plan Details Patient Name: Date of Service: Jessica Sos [name]. 05/23/2023 8:00 A M Medical Record Number: [id number] Patient Account Number: [id number] Date of Birth/Sex: Treating RN: [birth year] (82 y.o. Jessica [name] Primary Care Brenisha Tsui: Ardean Larsen Other Clinician: Referring Koki Buxton: Treating Shandiin Eisenbeis/Extender: Carolann Littler RKS, ISA A C Weeks in Treatment: 0 Active Inactive Wound/Skin Impairment Nursing Diagnoses: Impaired tissue integrity Goals: Patient/caregiver will verbalize understanding of skin care regimen Date Initiated: 05/23/2023 Target Resolution Date: 08/23/2023 Goal Status: Active Interventions: Assess ulceration(s) every visit Treatment Activities: Skin care regimen initiated : 05/23/2023 Notes: Electronic Signature(s) Signed: 05/23/2023 10:29:38 AM By: Karie Schwalbe RN Entered By: Karie Schwalbe on 05/23/2023 10:26:41 Jessica [name] ([id number]) [id number] Page 6 of 9 -------------------------------------------------------------------------------- Pain Assessment Details Patient Name: Date of Service: Jessica Sos [name]. 05/23/2023 8:00 A M Medical Record Number: [id number] Patient Account Number: [id number] Date of Birth/Sex: Treating RN: 01/07/1941 (82 y.o. Jessica [name] Primary Care Emary Zalar: Ardean Larsen Other Clinician: Referring Koby Pickup: Treating Sriram Febles/Extender: Carolann Littler RKS, ISA A C Weeks in Treatment: 0 Active Problems Location of Pain Severity and Description of Pain Patient Has Paino Yes Site Locations Pain Location: Generalized Pain With Dressing Change: No Duration of the Pain. Constant / Intermittento Constant Rate the  pain. Current Pain Level: 3 Worst Pain Level: 6 Least Pain Level: 2 Tolerable Pain Level: 6 Character of Pain Describe the Pain: Shooting Pain Management and Medication Current Pain Management: Medication: Yes Cold Application: No Rest: Yes Massage: No Activity: No T.E.N.S.: No Heat Application: No Leg drop or elevation: No Is the Current Pain Management Adequate: Adequate How does your wound impact your activities of daily livingo Sleep: No Bathing: No Appetite: No Relationship With Others: No Bladder Continence: No Emotions: No Bowel Continence: No Work: No Toileting: No Drive: No Dressing: No Hobbies: No Electronic Signature(s) Signed: 05/23/2023 10:29:38 AM By: Karie Schwalbe RN Entered By: Karie Schwalbe on 05/23/2023 08:43:58 -------------------------------------------------------------------------------- Patient/Caregiver Education Details Patient Name: Date of Service: [name] 9/24/2024andnbsp8:00 A M Medical Record Number: [id number] Patient Account Number: [id number] Jessica [name], Jessica [name] ([id number]) [phone].pdf Page 7 of 9 Date of Birth/Gender: Treating RN: 04-13-1941 (82 y.o. Jessica [name] Primary Care Physician: Ardean Larsen Other Clinician: Referring Physician: Treating Physician/Extender: Carolann Littler RKS, ISA A C Weeks in Treatment: 0 Education Assessment Education Provided To: Patient Education Topics Provided Wound/Skin Impairment: Methods: Explain/Verbal Responses: State content correctly Electronic Signature(s)  Brachial Index (ABI) - do not check if billed separately Has the patient been seen at the hospital within the last three years: Yes Total Score: 105 Level Of Care: New/Established - Level 3 Electronic Signature(s) Signed: 05/23/2023 10:29:38 AM By: Karie Schwalbe RN Entered By: Karie Schwalbe on 05/23/2023 09:26:49 Jessica [name] ([id number]) [id number] Page 3 of 9 -------------------------------------------------------------------------------- Encounter Discharge Information Details Patient Name: Date of Service: Jessica Sos [name]. 05/23/2023 8:00 A M Medical Record Number: [id number] Patient Account Number: [id number] Date of Birth/Sex: Treating RN: 06-29-1941 (82 y.o. Jessica [name] Primary Care Ilir Mahrt: Ardean Larsen Other Clinician: Referring Garo Heidelberg: Treating Jennier Schissler/Extender: Carolann Littler RKS, ISA A C Weeks in Treatment: 0 Encounter Discharge Information Items Post Procedure Vitals Discharge Condition: Stable Temperature (F): 97.5 Ambulatory Status: Cane Pulse (bpm): 67 Discharge Destination: Home Respiratory Rate (breaths/min): 18 Transportation: Private Auto Blood Pressure (mmHg): 141/70 Accompanied By: self Schedule Follow-up Appointment: Yes Clinical Summary of Care: Patient Declined Electronic Signature(s) Signed: 05/23/2023 10:29:38 AM By: Karie Schwalbe RN Entered By: Karie Schwalbe on 05/23/2023 10:27:58 -------------------------------------------------------------------------------- Lower Extremity Assessment Details Patient Name: Date of Service: Jessica Sos [name]. 05/23/2023 8:00 A M Medical Record Number: [id number] Patient Account Number:  [id number] Date of Birth/Sex: Treating RN: Apr 15, 1941 (82 y.o. Jessica [name] Primary Care Daris Harkins: Ardean Larsen Other Clinician: Referring Jobany Montellano: Treating Arlyn Buerkle/Extender: Carolann Littler RKS, ISA A C Weeks in Treatment: 0 Edema Assessment Assessed: [Left: No] [Right: No] Edema: [Left: N] [Right: o] Calf Left: Right: Point of Measurement: 30 cm From Medial Instep 30 cm Ankle Left: Right: Point of Measurement: 9 cm From Medial Instep 17 cm Knee To Floor Left: Right: From Medial Instep 37 cm Vascular Assessment Pulses: Dorsalis Pedis Palpable: [Left:Yes] Posterior Tibial Palpable: [Left:Yes] Extremity colors, hair growth, and conditions: Extremity Color: [Left:Normal] Temperature of Extremity: [Left:Warm] Capillary Refill: [Left:< 3 seconds] Horsfall, Tracye [name] ([id number]) [Right:[id number] Page 4 of 9] Dependent Rubor: [Left:No] Blanched when Elevated: [Left:No No] Toe Nail Assessment Left: Right: Thick: No Discolored: No Deformed: No Improper Length and Hygiene: No Notes Unable to get Left ABI-painful for pt. Electronic Signature(s) Signed: 05/23/2023 10:29:38 AM By: Karie Schwalbe RN Entered By: Karie Schwalbe on 05/23/2023 08:40:26 -------------------------------------------------------------------------------- Multi Wound Chart Details Patient Name: Date of Service: Jessica Sos [name]. 05/23/2023 8:00 A M Medical Record Number: [id number] Patient Account Number: [id number] Date of Birth/Sex: Treating RN: 1941-04-13 (82 y.o. F) Primary Care Susa Bones: Ardean Larsen Other Clinician: Referring Shelly Shoultz: Treating Matti Killingsworth/Extender: Carolann Littler RKS, ISA A C Weeks in Treatment: 0 Vital Signs Height(in): 60 Pulse(bpm): 67 Weight(lbs): 98 Blood Pressure(mmHg): 141/70 Body Mass Index(BMI): 19.1 Temperature(F): 97.5 Respiratory Rate(breaths/min): 18 [1:Photos:] [N/A:N/A] Left Lower Leg N/A N/A Wound  Location: Trauma N/A N/A Wounding Event: Trauma, Other N/A N/A Primary Etiology: Glaucoma, Hypertension N/A N/A Comorbid History: 12/28/2022 N/A N/A Date Acquired: 0 N/A N/A Weeks of Treatment: Open N/A N/A Wound Status: No N/A N/A Wound Recurrence: 1.5x0.9x0.1 N/A N/A Measurements L x W x D (cm) 1.06 N/A N/A A (cm) : rea 0.106 N/A N/A Volume (cm) : Full Thickness Without Exposed N/A N/A Classification: Support Structures Medium N/A N/A Exudate A mount: Serosanguineous N/A N/A Exudate Type: red, brown N/A N/A Exudate Color: Distinct, outline attached N/A N/A Wound Margin: Small (1-33%) N/A N/A Granulation Amount: Pink N/A N/A Granulation Quality: Large (67-100%) N/A N/A Necrotic Amount: Eschar, Adherent Slough N/A N/A Necrotic Tissue: Fat Layer (Subcutaneous Tissue): Yes N/A N/A Exposed Structures:

## 2023-05-23 NOTE — Progress Notes (Signed)
SHEYENNE, LAMBERTUS B ([id number]) [phone].pdf Page 1 of 4 Visit Report for 05/23/2023 Abuse Risk Screen Details Patient Name: Date of Service: Jessica Sos B. 05/23/2023 8:00 A M Medical Record Number: [id number] Patient Account Number: [id number] Date of Birth/Sex: Treating RN: July 18, 1941 (82 y.o. [name] Primary Care Lajuanna Pompa: Ardean Larsen Other Clinician: Referring Thailan Sava: Treating Ivette Castronova/Extender: Carolann Littler RKS, ISA A C Weeks in Treatment: 0 Abuse Risk Screen Items Answer ABUSE RISK SCREEN: Has anyone close to you tried to hurt or harm you recentlyo No Do you feel uncomfortable with anyone in your familyo No Has anyone forced you do things that you didnt want to doo No Electronic Signature(s) Signed: 05/23/2023 10:29:38 AM By: Karie Schwalbe RN Entered By: Karie Schwalbe on 05/23/2023 05:28:56 -------------------------------------------------------------------------------- Activities of Daily Living Details Patient Name: Date of Service: Jessica Sos B. 05/23/2023 8:00 A M Medical Record Number: [id number] Patient Account Number: [id number] Date of Birth/Sex: Treating RN: 07-13-1941 (82 y.o. [name] Primary Care Talishia Betzler: Ardean Larsen Other Clinician: Referring Rubin Dais: Treating Devonte Migues/Extender: Carolann Littler RKS, ISA A C Weeks in Treatment: 0 Activities of Daily Living Items Answer Activities of Daily Living (Please select one for each item) Drive Automobile Completely Able T Medications ake Completely Able Use T elephone Completely Able Care for Appearance Completely Able Use T oilet Completely Able Bath / Shower Completely Able Dress Self Completely Able Feed Self Completely Able Walk Completely Able Get In / Out Bed Completely Able Housework Completely Able Prepare Meals Completely Able Handle Money Completely Able Shop for Self Completely Able Electronic  Signature(s) Signed: 05/23/2023 10:29:38 AM By: Karie Schwalbe RN Entered By: Karie Schwalbe on 05/23/2023 05:29:39 [name] ([id number]) [id number] Page 2 of 4 -------------------------------------------------------------------------------- Education Screening Details Patient Name: Date of Service: Jessica Sos B. 05/23/2023 8:00 A M Medical Record Number: [id number] Patient Account Number: [id number] Date of Birth/Sex: Treating RN: May 19, 1941 (82 y.o. [name] Primary Care Idonia Zollinger: Ardean Larsen Other Clinician: Referring Erendida Wrenn: Treating Tiyanna Larcom/Extender: Carolann Littler RKS, ISA A C Weeks in Treatment: 0 Primary Learner Assessed: Patient Learning Preferences/Education Level/Primary Language Learning Preference: Explanation, Demonstration, Printed Material Highest Education Level: College or Above Preferred Language: English Cognitive Barrier Language Barrier: No Translator Needed: No Memory Deficit: No Emotional Barrier: No Cultural/Religious Beliefs Affecting Medical Care: No Physical Barrier Impaired Vision: No Impaired Hearing: No Decreased Hand dexterity: No Knowledge/Comprehension Knowledge Level: High Comprehension Level: High Ability to understand written instructions: High Ability to understand verbal instructions: High Motivation Anxiety Level: Calm Cooperation: Cooperative Education Importance: Acknowledges Need Interest in Health Problems: Asks Questions Perception: Coherent Willingness to Engage in Self-Management High Activities: Readiness to Engage in Self-Management High Activities: Electronic Signature(s) Signed: 05/23/2023 10:29:38 AM By: Karie Schwalbe RN Entered By: Karie Schwalbe on 05/23/2023 05:30:32 -------------------------------------------------------------------------------- Fall Risk Assessment Details Patient Name: Date of Service: Jessica Sos B.  05/23/2023 8:00 A M Medical Record Number: [id number] Patient Account Number: [id number] Date of Birth/Sex: Treating RN: 1941-04-15 (82 y.o. [name] Primary Care Cedar Roseman: Ardean Larsen Other Clinician: Referring Tephanie Escorcia: Treating Lyell Clugston/Extender: Carolann Littler RKS, ISA A C Weeks in Treatment: 0 Fall Risk Assessment Items Have you had 2 or more falls in the last 12 monthso 0 No Lerette, Devinne B ([id number]) [id number] Page 3 of 4 Have you had any fall that resulted in injury in the last 12 monthso 0 No FALLS RISK SCREEN  History of falling - immediate or within 3 months 0 No Secondary diagnosis (Do you have 2 or more medical diagnoseso) 0 No Ambulatory aid None/bed rest/wheelchair/nurse 0 No Crutches/cane/walker 0 No Furniture 0 No Intravenous therapy Access/Saline/Heparin Lock 0 No Gait/Transferring Normal/ bed rest/ wheelchair 0 No Weak (short steps with or without shuffle, stooped but able to lift head while walking, may seek 0 No support from furniture) Impaired (short steps with shuffle, may have difficulty arising from chair, head down, impaired 0 No balance) Mental Status Oriented to own ability 0 No Electronic Signature(s) Signed: 05/23/2023 10:29:38 AM By: Karie Schwalbe RN Entered By: Karie Schwalbe on 05/23/2023 05:30:42 -------------------------------------------------------------------------------- Foot Assessment Details Patient Name: Date of Service: Jessica Sos B. 05/23/2023 8:00 A M Medical Record Number: [id number] Patient Account Number: [id number] Date of Birth/Sex: Treating RN: December 25, 1940 (82 y.o. [name] Primary Care Danny Yackley: Ardean Larsen Other Clinician: Referring Malaya Cagley: Treating Andron Marrazzo/Extender: Carolann Littler RKS, ISA A C Weeks in Treatment: 0 Foot Assessment Items Site Locations + = Sensation present, - = Sensation absent, C = Callus, U = Ulcer R =  Redness, W = Warmth, M = Maceration, PU = Pre-ulcerative lesion F = Fissure, S = Swelling, D = Dryness Assessment Right: Left: Other Deformity: No No Prior Foot Ulcer: No No Prior Amputation: No No Charcot Joint: No No Ambulatory Status: Ambulatory Without Help GaitCATIRIA, DELAGRANGE B ([id number]) [id number] Page 4 of 4 Electronic Signature(s) Signed: 05/23/2023 10:29:38 AM By: Karie Schwalbe RN Entered By: Karie Schwalbe on 05/23/2023 05:32:36 -------------------------------------------------------------------------------- Nutrition Risk Screening Details Patient Name: Date of Service: Jessica Sos B. 05/23/2023 8:00 A M Medical Record Number: [id number] Patient Account Number: [id number] Date of Birth/Sex: Treating RN: October 14, 1940 (82 y.o. [name] Primary Care Matilde Pottenger: Ardean Larsen Other Clinician: Referring Imogine Carvell: Treating Annalena Piatt/Extender: Carolann Littler RKS, ISA A C Weeks in Treatment: 0 Height (in): 60 Weight (lbs): 98 Body Mass Index (BMI): 19.1 Nutrition Risk Screening Items Score Screening NUTRITION RISK SCREEN: I have an illness or condition that made me change the kind and/or amount of food I eat 0 No I eat fewer than two meals per day 0 No I eat few fruits and vegetables, or milk products 0 No I have three or more drinks of beer, liquor or wine almost every day 0 No I have tooth or mouth problems that make it hard for me to eat 0 No I don't always have enough money to buy the food I need 0 No I eat alone most of the time 0 No I take three or more different prescribed or over-the-counter drugs a day 0 No Without wanting to, I have lost or gained 10 pounds in the last six months 0 No I am not always physically able to shop, cook and/or feed myself 0 No Nutrition Protocols Good Risk Protocol 0 No interventions needed Moderate Risk Protocol High Risk Proctocol Risk Level: Good Risk Score:  0 Electronic Signature(s) Signed: 05/23/2023 10:29:38 AM By: Karie Schwalbe RN Entered By: Karie Schwalbe on 05/23/2023 05:30:50

## 2023-05-30 ENCOUNTER — Encounter (HOSPITAL_BASED_OUTPATIENT_CLINIC_OR_DEPARTMENT_OTHER): Payer: Medicare PPO | Attending: General Surgery | Admitting: General Surgery

## 2023-05-30 DIAGNOSIS — L97822 Non-pressure chronic ulcer of other part of left lower leg with fat layer exposed: Secondary | ICD-10-CM | POA: Insufficient documentation

## 2023-05-30 DIAGNOSIS — E039 Hypothyroidism, unspecified: Secondary | ICD-10-CM | POA: Insufficient documentation

## 2023-05-30 DIAGNOSIS — I10 Essential (primary) hypertension: Secondary | ICD-10-CM | POA: Diagnosis not present

## 2023-05-30 DIAGNOSIS — S81802A Unspecified open wound, left lower leg, initial encounter: Secondary | ICD-10-CM | POA: Diagnosis not present

## 2023-05-30 DIAGNOSIS — I2510 Atherosclerotic heart disease of native coronary artery without angina pectoris: Secondary | ICD-10-CM | POA: Diagnosis not present

## 2023-05-30 NOTE — Progress Notes (Signed)
Jessica [name] ([id number]) [id number] Page 1 of 8 Visit Report for 05/30/2023 Chief Complaint Document Details Patient Name: Date of Service: Jessica [name] 05/30/2023 1:00 PM Medical Record Number: [id number] Patient Account Number: [id number] Date of Birth/Sex: Treating RN: 10-Oct-1940 (82 y.o. F) Primary Care Provider: Ardean Larsen Other Clinician: Referring Provider: Treating Provider/Extender: Juventino Slovak, Rinka Weeks in Treatment: 1 Information Obtained from: Patient Chief Complaint Patient seen for complaints of Non-Healing Wound. Electronic Signature(s) Signed: 05/30/2023 1:37:33 PM By: Duanne Guess MD FACS Entered By: Duanne Guess on 05/30/2023 13:37:33 -------------------------------------------------------------------------------- Debridement Details Patient Name: Date of Service: Jessica [name]. 05/30/2023 1:00 PM Medical Record Number: [id number] Patient Account Number: [id number] Date of Birth/Sex: Treating RN: 06-19-1941 (82 y.o. Jessica [name] Primary Care Provider: Ardean Larsen Other Clinician: Referring Provider: Treating Provider/Extender: Alfonse Ras in Treatment: 1 Debridement Performed for Assessment: Wound #1 Left Lower Leg Performed By: Physician Duanne Guess, MD The following information was scribed by: Karie Schwalbe The information was scribed for: Duanne Guess Debridement Type: Debridement Level of Consciousness (Pre-procedure): Awake and Alert Pre-procedure Verification/Time Out Yes - 13:20 Taken: Start Time: 13:20 Pain Control: Lidocaine 4% T opical Solution Percent of Wound Bed Debrided: 100% T Area Debrided (cm): otal 0.39 Tissue and other material debrided: Non-Viable, Slough, Slough Level: Non-Viable Tissue Debridement Description: Selective/Open Wound Instrument: Curette Bleeding: Minimum Hemostasis Achieved: Pressure End  Time: 13:22 Procedural Pain: 0 Post Procedural Pain: 0 Response to Treatment: Procedure was tolerated well Level of Consciousness (Post- Awake and Alert procedure): Post Debridement Measurements of Total Wound Length: (cm) 1 Width: (cm) 0.5 Depth: (cm) 0.1 Volume: (cm) 0.039 Jessica [name] ([id number]) [phone].pdf Page 2 of 8 Character of Wound/Ulcer Post Debridement: Improved Post Procedure Diagnosis Same as Pre-procedure Electronic Signature(s) Signed: 05/30/2023 2:27:11 PM By: Duanne Guess MD FACS Signed: 05/30/2023 4:35:14 PM By: Karie Schwalbe RN Entered By: Karie Schwalbe on 05/30/2023 13:28:18 -------------------------------------------------------------------------------- HPI Details Patient Name: Date of Service: Jessica [name]. 05/30/2023 1:00 PM Medical Record Number: [id number] Patient Account Number: [id number] Date of Birth/Sex: Treating RN: 1941/01/22 (82 y.o. F) Primary Care Provider: Ardean Larsen Other Clinician: Referring Provider: Treating Provider/Extender: Juventino Slovak, Rinka Weeks in Treatment: 1 History of Present Illness HPI Description: ADMISSION 05/23/2023 ***PATIENT UNABLE TO TOLERATE CUFFS FOR ABI*** This is a relatively healthy 82 year old woman, non-smoker and nondiabetic, with primarily orthopedic issues in her past medical history. She was referred to the clinic today by her primary care doctor to address a left lower leg wound. The wound apparently occurred in May while she was gardening. She is uncertain exactly how it happened but perhaps struck her leg on a stick or similar protruding structure. She completed a course of both clindamycin and Augmentin based upon a wound swab culture that grew methicillin sensitive Staph aureus. She has been applying some triamcinolone cream that she had from a previous wound care admission at the [facility] wound care center. 05/30/2023: The wound  measured smaller today. It is also cleaner, with just a little bit of slough on the surface. No significant periwound erythema. Electronic Signature(s) Signed: 05/30/2023 1:38:24 PM By: Duanne Guess MD FACS Entered By: Duanne Guess on 05/30/2023 13:38:24 -------------------------------------------------------------------------------- Physical Exam Details Patient Name: Date of Service: Jessica [name]. 05/30/2023 1:00 PM Medical Record Number: [id number] Patient Account Number: [id number] Date of Birth/Sex: Treating RN: 10-Apr-1941 (82 y.o. F) Primary Care Provider: Ardean Larsen  Other Clinician: Referring Provider: Treating Provider/Extender: Juventino Slovak, Rinka Weeks in Treatment: 1 Constitutional . . . . no acute distress. Respiratory Normal work of breathing on room air. Notes 05/30/2023: The wound measured smaller today. It is also cleaner, with just a little bit of slough on the surface. No significant periwound erythema. Electronic Signature(s) Signed: 05/30/2023 1:46:55 PM By: Duanne Guess MD FACS Previous Signature: 05/30/2023 1:40:42 PM Version By: Duanne Guess MD FACS Jessica [name] ([id number]) [id number] Page 3 of 8 Previous Signature: 05/30/2023 1:40:42 PM Version By: Duanne Guess MD FACS Entered By: Duanne Guess on 05/30/2023 13:46:55 -------------------------------------------------------------------------------- Physician Orders Details Patient Name: Date of Service: Jessica [name]. 05/30/2023 1:00 PM Medical Record Number: [id number] Patient Account Number: [id number] Date of Birth/Sex: Treating RN: 12-Nov-1940 (82 y.o. Jessica [name] Primary Care Provider: Ardean Larsen Other Clinician: Referring Provider: Treating Provider/Extender: Alfonse Ras in Treatment: 1 Verbal / Phone Orders: No Diagnosis Coding ICD-10 Coding Code Description [phone]  Non-pressure chronic ulcer of other part of left lower leg with fat layer exposed Follow-up Appointments ppointment in 1 week. - Dr. Lady Gary Room 2 06/06/23 at 7:45am Return A ppointment in 2 weeks. - Dr. Lady Gary Room 3 06/13/23 at 9:30am Return A Return appointment in 3 weeks. - Dr. Lady Gary Room 3 06/20/23 at 11am Anesthetic (In clinic) Topical Lidocaine 4% applied to wound bed Bathing/ Shower/ Hygiene May shower and wash wound with soap and water. - Keep Left leg wrap dry. Use a cast protector to keep left leg dry. Cast Protector can be purchased from [employer], [place], Medical supply stores etc. Cost ranges between $17-$30 Edema Control - Lymphedema / SCD / Other Left Lower Extremity Elevate legs to the level of the heart or above for 30 minutes daily and/or when sitting for 3-4 times a day throughout the day. - When sitting please elevate legs. Avoid standing for long periods of time. Exercise regularly - As tolerated Additional Orders / Instructions Follow Nutritious Diet - Try and increase protein to 70g-100g per day. Wound Treatment Wound #1 - Lower Leg Wound Laterality: Left Cleanser: Soap and Water 1 x Per Week/30 Days Discharge Instructions: May shower and wash wound with dial antibacterial soap and water prior to dressing change. Cleanser: Vashe 5.8 (oz) 1 x Per Week/30 Days Discharge Instructions: Cleanse the wound with Vashe prior to applying a clean dressing using gauze sponges, not tissue or cotton balls. Topical: Gentamicin 1 x Per Week/30 Days Discharge Instructions: As directed by physician Topical: Mupirocin Ointment 1 x Per Week/30 Days Discharge Instructions: Apply Mupirocin (Bactroban) as instructed Prim Dressing: Promogran Prisma Matrix, 4.34 (sq in) (silver collagen) 1 x Per Week/30 Days ary Discharge Instructions: Moisten collagen with saline or hydrogel Secondary Dressing: Woven Gauze Sponge, Non-Sterile 4x4 in 1 x Per Week/30 Days Discharge Instructions: Apply  over primary dressing as directed. Secured With: Coban Self-Adherent Wrap 4x5 (in/yd) 1 x Per Week/30 Days Discharge Instructions: Secure with Coban as directed. Secured With: [employer], 4.5x3.1 (in/yd) 1 x Per Week/30 Days Discharge Instructions: Secure with Kerlix as directed. Secured With: Transpore Surgical Tape, 2x10 (in/yd) 1 x Per Week/30 Days Discharge Instructions: Secure dressing with tape as directed. Jessica [name], Jessica [name] ([id number]) [id number] Page 4 of 8 Secured With: Tubular Netting #5 1 x Per Week/30 Days Electronic Signature(s) Signed: 05/30/2023 2:27:11 PM By: Duanne Guess MD FACS Entered By: Duanne Guess on 05/30/2023 13:47:11 -------------------------------------------------------------------------------- Problem List Details Patient Name: Date of Service: Jessica A  Robbi Garter [name]. 05/30/2023 1:00 PM Medical Record Number: [id number] Patient Account Number: [id number] Date of Birth/Sex: Treating RN: May 08, 1941 (82 y.o. F) Primary Care Provider: Ardean Larsen Other Clinician: Referring Provider: Treating Provider/Extender: Juventino Slovak, Rinka Weeks in Treatment: 1 Active Problems ICD-10 Encounter Code Description Active Date MDM Diagnosis L97.822 Non-pressure chronic ulcer of other part of left lower leg with fat layer exposed9/24/2024 No Yes Inactive Problems Resolved Problems Electronic Signature(s) Signed: 05/30/2023 1:36:54 PM By: Duanne Guess MD FACS Entered By: Duanne Guess on 05/30/2023 13:36:54 -------------------------------------------------------------------------------- Progress Note Details Patient Name: Date of Service: Jessica [name]. 05/30/2023 1:00 PM Medical Record Number: [id number] Patient Account Number: [id number] Date of Birth/Sex: Treating RN: 1940/09/23 (82 y.o. F) Primary Care Provider: Ardean Larsen Other Clinician: Referring Provider: Treating  Provider/Extender: Juventino Slovak, Rinka Weeks in Treatment: 1 Subjective Chief Complaint Information obtained from Patient Patient seen for complaints of Non-Healing Wound. History of Present Illness (HPI) ADMISSION 05/23/2023 ***PATIENT UNABLE TO TOLERATE CUFFS FOR ABI*** This is a relatively healthy 82 year old woman, non-smoker and nondiabetic, with primarily orthopedic issues in her past medical history. She was referred to the clinic today by her primary care doctor to address a left lower leg wound. The wound apparently occurred in May while she was gardening. She is uncertain exactly how it happened but perhaps struck her leg on a stick or similar protruding structure. She completed a course of both clindamycin and Augmentin based upon a wound swab culture that grew methicillin sensitive Staph aureus. She has been applying some triamcinolone cream that she had from a previous wound Jessica [name], Jessica [name] ([id number]) [phone].pdf Page 5 of 8 care admission at the [facility] wound care center. 05/30/2023: The wound measured smaller today. It is also cleaner, with just a little bit of slough on the surface. No significant periwound erythema. Patient History Information obtained from Patient. Family History Unknown History. Social History Former smoker - ended on 08/30/1975, Marital Status - Divorced, Alcohol Use - Rarely - wine, Drug Use - No History, Caffeine Use - Daily - coffee1/1/19977. Medical History Eyes Patient has history of Glaucoma Cardiovascular Patient has history of Hypertension Hospitalization/Surgery History - Left Hip Replacement. - Bilateral feet Bunion and Hammer toe surgeries. - Bilateral Carpal tunnel repair. 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 Vitals Time Taken: 12:50 PM, Height: 60 in, Weight: 98 lbs, BMI: 19.1, Temperature: 97.9 F, Pulse: 73 bpm, Respiratory Rate: 18 breaths/min, Blood Pressure: 121/70 mmHg. Respiratory Normal work of breathing on room air. General Notes: 05/30/2023: The wound measured smaller today. It is also cleaner, with just a little bit of slough on the surface. No significant periwound erythema. Integumentary (Hair, Skin) Wound #1 status is Open. Original cause of wound was Trauma. The date acquired was: 12/28/2022. The wound has been in treatment 1 weeks. The wound is located on the Left Lower Leg. The wound measures 1cm length x 0.5cm width x 0.1cm depth; 0.393cm^2 area and 0.039cm^3 volume. There is Fat Layer (Subcutaneous Tissue) exposed. There is no tunneling or undermining noted. There is a medium amount of serosanguineous drainage noted. The wound margin is distinct with the outline attached to the wound base. There is small (1-33%) pink granulation within the wound bed. There is a large (67-100%) amount of  necrotic tissue within the wound bed including Eschar and Adherent Slough. The periwound skin appearance had no abnormalities noted for texture. The periwound skin appearance had no abnormalities noted for color. The periwound skin appearance did not exhibit: Dry/Scaly, Maceration. Periwound temperature was noted as No Abnormality. Assessment Active Problems ICD-10 Non-pressure chronic ulcer of other part of left lower leg with fat layer exposed Procedures Wound #1 Pre-procedure diagnosis of Wound #1 is a Trauma, Other located on the Left Lower Leg . There was a Selective/Open Wound Non-Viable Tissue Debridement with a total area of 0.39 sq cm performed by Duanne Guess, MD. With the following instrument(s): Curette to remove Non-Viable tissue/material. Material removed includes [facility] after achieving pain control  using Lidocaine 4% Topical Solution. No specimens were taken. A time out was conducted at 13:20, prior Jessica [name], Jessica [name] ([id number]) [phone].pdf Page 6 of 8 to the start of the procedure. A Minimum amount of bleeding was controlled with Pressure. The procedure was tolerated well with a pain level of 0 throughout and a pain level of 0 following the procedure. Post Debridement Measurements: 1cm length x 0.5cm width x 0.1cm depth; 0.039cm^3 volume. Character of Wound/Ulcer Post Debridement is improved. Post procedure Diagnosis Wound #1: Same as Pre-Procedure Plan Follow-up Appointments: Return Appointment in 1 week. - Dr. Lady Gary Room 2 06/06/23 at 7:45am Return Appointment in 2 weeks. - Dr. Lady Gary Room 3 06/13/23 at 9:30am Return appointment in 3 weeks. - Dr. Lady Gary Room 3 06/20/23 at 11am Anesthetic: (In clinic) Topical Lidocaine 4% applied to wound bed Bathing/ Shower/ Hygiene: May shower and wash wound with soap and water. - Keep Left leg wrap dry. Use a cast protector to keep left leg dry. Cast Protector can be purchased from [employer], [place], Medical supply stores etc. Cost ranges between $17-$30 Edema Control - Lymphedema / SCD / Other: Elevate legs to the level of the heart or above for 30 minutes daily and/or when sitting for 3-4 times a day throughout the day. - When sitting please elevate legs. Avoid standing for long periods of time. Exercise regularly - As tolerated Additional Orders / Instructions: Follow Nutritious Diet - Try and increase protein to 70g-100g per day. WOUND #1: - Lower Leg Wound Laterality: Left Cleanser: Soap and Water 1 x Per Week/30 Days Discharge Instructions: May shower and wash wound with dial antibacterial soap and water prior to dressing change. Cleanser: Vashe 5.8 (oz) 1 x Per Week/30 Days Discharge Instructions: Cleanse the wound with Vashe prior to applying a clean dressing using gauze sponges, not tissue or cotton  balls. Topical: Gentamicin 1 x Per Week/30 Days Discharge Instructions: As directed by physician Topical: Mupirocin Ointment 1 x Per Week/30 Days Discharge Instructions: Apply Mupirocin (Bactroban) as instructed Prim Dressing: Promogran Prisma Matrix, 4.34 (sq in) (silver collagen) 1 x Per Week/30 Days ary Discharge Instructions: Moisten collagen with saline or hydrogel Secondary Dressing: Woven Gauze Sponge, Non-Sterile 4x4 in 1 x Per Week/30 Days Discharge Instructions: Apply over primary dressing as directed. Secured With: Coban Self-Adherent Wrap 4x5 (in/yd) 1 x Per Week/30 Days Discharge Instructions: Secure with Coban as directed. Secured With: [employer], 4.5x3.1 (in/yd) 1 x Per Week/30 Days Discharge Instructions: Secure with Kerlix as directed. Secured With: Transpore Surgical T ape, 2x10 (in/yd) 1 x Per Week/30 Days Discharge Instructions: Secure dressing with tape as directed. Secured With: Tubular Netting #5 1 x Per Week/30 Days 05/30/2023: The wound measured smaller today. It is also cleaner, with just a little bit of slough on  the surface. No significant periwound erythema. I used a curette to debride the slough from her wound. We will continue topical gentamicin and mupirocin with Prisma silver collagen, Kerlix and Coban wrap. Follow-up in 1 week. Electronic Signature(s) Signed: 05/30/2023 1:47:46 PM By: Duanne Guess MD FACS Entered By: Duanne Guess on 05/30/2023 13:47:46 -------------------------------------------------------------------------------- HxROS Details Patient Name: Date of Service: Jessica [name]. 05/30/2023 1:00 PM Medical Record Number: [id number] Patient Account Number: [id number] Date of Birth/Sex: Treating RN: [birth year] (82 y.o. F) Primary Care Provider: Ardean Larsen Other Clinician: Referring Provider: Treating Provider/Extender: Juventino Slovak, Rinka Weeks in Treatment: 1 Information Obtained  From Patient Jessica [name], Jessica [name] ([id number]) [id number] Page 7 of 8 Eyes Medical History: Positive for: Glaucoma Cardiovascular Medical History: Positive for: Hypertension Past Medical History Notes: Hx: TIA; Coronary artery disease with angina pectoris Endocrine Medical History: Past Medical History Notes: Hx: Hypothyroidism Musculoskeletal Medical History: Past Medical History Notes: Hx: Scoliosis; Left foot tendon rupture Oncologic Medical History: Past Medical History Notes: Hx: Basal cell carcinoma ( Right cheek 25 years ago) HBO Extended History Items Eyes: Glaucoma Immunizations Pneumococcal Vaccine: Received Pneumococcal Vaccination: No Implantable Devices None Hospitalization / Surgery History Type of Hospitalization/Surgery Left Hip Replacement Bilateral feet Bunion and Hammer toe surgeries Bilateral Carpal tunnel repair Family and Social History Unknown History: Yes; Former smoker - ended on 08/30/1975; Marital Status - Divorced; Alcohol Use: Rarely - wine; Drug Use: No History; Caffeine Use: Daily - coffee1/1/19977; Financial Concerns: No; Food, Clothing or Shelter Needs: No; Support System Lacking: No; Transportation Concerns: No Electronic Signature(s) Signed: 05/30/2023 2:27:11 PM By: Duanne Guess MD FACS Entered By: Duanne Guess on 05/30/2023 13:39:12 -------------------------------------------------------------------------------- SuperBill Details Patient Name: Date of Service: Jessica [name] 05/30/2023 Medical Record Number: [id number] Patient Account Number: [id number] Date of Birth/Sex: Treating RN: 10-Dec-1940 (82 y.o. F) Primary Care Provider: Ardean Larsen Other Clinician: Referring Provider: Treating Provider/Extender: Juventino Slovak, Rinka Weeks in Treatment: 1 Diagnosis Coding Jessica [name], Jessica [name] ([id number]) [id number] Page 8 of 8 ICD-10 Codes Code  Description [phone] Non-pressure chronic ulcer of other part of left lower leg with fat layer exposed Facility Procedures : CPT4 Code: [id number] Description: 97597 - DEBRIDE WOUND 1ST 20 SQ CM OR < ICD-10 Diagnosis Description L97.822 Non-pressure chronic ulcer of other part of left lower leg with fat layer expose Modifier: d Quantity: 1 Physician Procedures : CPT4 Code Description Modifier [id number] - WC PHYS LEVEL 4 - EST PT 25 ICD-10 Diagnosis Description L97.822 Non-pressure chronic ulcer of other part of left lower leg with fat layer exposed Quantity: 1 : [id number] - WC PHYS DEBR WO ANESTH 20 SQ CM ICD-10 Diagnosis Description L97.822 Non-pressure chronic ulcer of other part of left lower leg with fat layer exposed Quantity: 1 Electronic Signature(s) Signed: 05/30/2023 1:48:02 PM By: Duanne Guess MD FACS Entered By: Duanne Guess on 05/30/2023 13:48:02

## 2023-05-30 NOTE — Progress Notes (Signed)
YUKIE, BERGERON [name] ([id number]) [id number] Page 1 of 7 Visit Report for 05/30/2023 Arrival Information Details Patient Name: Date of Service: Jessica [name] 05/30/2023 1:00 PM Medical Record Number: [id number] Patient Account Number: [id number] Date of Birth/Sex: Treating RN: 11/01/1940 (82 y.o. F) Primary Care Kale Rondeau: Ardean Larsen Other Clinician: Referring Raziah Funnell: Treating Tarris Delbene/Extender: Juventino Slovak, Rinka Weeks in Treatment: 1 Visit Information History Since Last Visit Added or deleted any medications: No Patient Arrived: Ambulatory Any new allergies or adverse reactions: No Arrival Time: 12:49 Had a fall or experienced change in No Accompanied By: self activities of daily living that may affect Transfer Assistance: None risk of falls: Patient Identification Verified: Yes Signs or symptoms of abuse/neglect since last visito No Secondary Verification Process Completed: Yes Hospitalized since last visit: No Patient Has Alerts: Yes Implantable device outside of the clinic excluding No Patient Alerts: Unable to complete ABI L cellular tissue based products placed in the center since last visit: Pain Present Now: No Electronic Signature(s) Signed: 05/30/2023 1:23:39 PM By: Dayton Scrape Entered By: Dayton Scrape on 05/30/2023 12:50:03 -------------------------------------------------------------------------------- Encounter Discharge Information Details Patient Name: Date of Service: Jessica Sos [name]. 05/30/2023 1:00 PM Medical Record Number: [id number] Patient Account Number: [id number] Date of Birth/Sex: Treating RN: 1941-01-18 (82 y.o. [name] Primary Care Kawana Hegel: Ardean Larsen Other Clinician: Referring Glennette Galster: Treating Roya Gieselman/Extender: Alfonse Ras in Treatment: 1 Encounter Discharge Information Items Post Procedure Vitals Discharge Condition: Stable Temperature  (F): 97.9 Ambulatory Status: Ambulatory Pulse (bpm): 73 Discharge Destination: Home Respiratory Rate (breaths/min): 18 Transportation: Private Auto Blood Pressure (mmHg): 121/70 Accompanied By: self Schedule Follow-up Appointment: Yes Clinical Summary of Care: Patient Declined Electronic Signature(s) Signed: 05/30/2023 4:35:14 PM By: Karie Schwalbe RN Entered By: Karie Schwalbe on 05/30/2023 16:08:38 [name] ([id number]) [id number] Page 2 of 7 -------------------------------------------------------------------------------- Lower Extremity Assessment Details Patient Name: Date of Service: Jessica Sos [name]. 05/30/2023 1:00 PM Medical Record Number: [id number] Patient Account Number: [id number] Date of Birth/Sex: Treating RN: [birth year] (82 y.o. [name] Primary Care Mariette Cowley: Ardean Larsen Other Clinician: Referring Valarie Farace: Treating Lewanda Perea/Extender: Juventino Slovak, Rinka Weeks in Treatment: 1 Edema Assessment Assessed: [Left: No] [Right: No] Edema: [Left: N] [Right: o] Calf Left: Right: Point of Measurement: 30 cm From Medial Instep 30 cm Ankle Left: Right: Point of Measurement: 9 cm From Medial Instep 17 cm Knee To Floor Left: Right: From Medial Instep 37 cm Vascular Assessment Pulses: Dorsalis Pedis Palpable: [Left:Yes] Extremity colors, hair growth, and conditions: Extremity Color: [Left:Normal] Temperature of Extremity: [Left:Warm] Capillary Refill: [Left:< 3 seconds] Dependent Rubor: [Left:No No] Electronic Signature(s) Signed: 05/30/2023 4:35:14 PM By: Karie Schwalbe RN Entered By: Karie Schwalbe on 05/30/2023 13:09:02 -------------------------------------------------------------------------------- Multi Wound Chart Details Patient Name: Date of Service: Jessica Sos [name]. 05/30/2023 1:00 PM Medical Record Number: [id number] Patient Account Number: [id number] Date of Birth/Sex:  Treating RN: [birth year] (82 y.o. F) Primary Care Ulyana Pitones: Ardean Larsen Other Clinician: Referring Akylah Hascall: Treating Saint Hank/Extender: Juventino Slovak, Rinka Weeks in Treatment: 1 Vital Signs Height(in): 60 Pulse(bpm): 73 Weight(lbs): 98 Blood Pressure(mmHg): 121/70 Body Mass Index(BMI): 19.1 Temperature(F): 97.9 Respiratory Rate(breaths/min): [address], Roselle [name] ([id number]):Wound Assessments] [id number].pdf Page 3 of [id number].pdf Page 3 of 7] Wound Number: 1 N/A N/A Photos: N/A N/A Left Lower Leg N/A N/A Wound Location: Trauma N/A N/A Wounding Event: Trauma, Other N/A N/A Primary Etiology: Glaucoma, Hypertension N/A N/A Comorbid History: 12/28/2022 N/A N/A Date Acquired:  1 N/A N/A Weeks of Treatment: Open N/A N/A Wound Status: No N/A N/A Wound Recurrence: 1x0.5x0.1 N/A N/A Measurements L x W x D (cm) 0.393 N/A N/A A (cm) : rea 0.039 N/A N/A Volume (cm) : 62.90% N/A N/A % Reduction in A rea: 63.20% N/A N/A % Reduction in Volume: Full Thickness Without Exposed N/A N/A Classification: Support Structures Medium N/A N/A Exudate A mount: Serosanguineous N/A N/A Exudate Type: red, brown N/A N/A Exudate Color: Distinct, outline attached N/A N/A Wound Margin: Small (1-33%) N/A N/A Granulation A mount: Pink N/A N/A Granulation Quality: Large (67-100%) N/A N/A Necrotic A mount: Eschar, Adherent Slough N/A N/A Necrotic Tissue: Fat Layer (Subcutaneous Tissue): Yes N/A N/A Exposed Structures: Fascia: No Tendon: No Muscle: No Joint: No Bone: No Small (1-33%) N/A N/A Epithelialization: Debridement - Selective/Open Wound N/A N/A Debridement: Pre-procedure Verification/Time Out 13:20 N/A N/A Taken: Lidocaine 4% Topical Solution N/A N/A Pain Control: Slough N/A N/A Tissue Debrided: Non-Viable Tissue N/A N/A Level: 0.39 N/A N/A Debridement A (sq cm): rea Curette N/A  N/A Instrument: Minimum N/A N/A Bleeding: Pressure N/A N/A Hemostasis A chieved: 0 N/A N/A Procedural Pain: 0 N/A N/A Post Procedural Pain: Procedure was tolerated well N/A N/A Debridement Treatment Response: 1x0.5x0.1 N/A N/A Post Debridement Measurements L x W x D (cm) 0.039 N/A N/A Post Debridement Volume: (cm) No Abnormalities Noted N/A N/A Periwound Skin Texture: Maceration: No N/A N/A Periwound Skin Moisture: Dry/Scaly: No No Abnormalities Noted N/A N/A Periwound Skin Color: No Abnormality N/A N/A Temperature: Debridement N/A N/A Procedures Performed: Treatment Notes Electronic Signature(s) Signed: 05/30/2023 1:37:04 PM By: Duanne Guess MD FACS Entered By: Duanne Guess on 05/30/2023 13:37:03 Multi-Disciplinary Care Plan Details -------------------------------------------------------------------------------- [name] ([id number]) [phone].pdf Page 4 of 7 Patient Name: Date of Service: Jessica [name] 05/30/2023 1:00 PM Medical Record Number: [id number] Patient Account Number: [id number] Date of Birth/Sex: Treating RN: 06/18/1941 (82 y.o. [name] Primary Care Jolee Critcher: Ardean Larsen Other Clinician: Referring Danyon Mcginness: Treating Thetis Schwimmer/Extender: Juventino Slovak, Rinka Weeks in Treatment: 1 Active Inactive Wound/Skin Impairment Nursing Diagnoses: Impaired tissue integrity Goals: Patient/caregiver will verbalize understanding of skin care regimen Date Initiated: 05/23/2023 Target Resolution Date: 08/23/2023 Goal Status: Active Interventions: Assess ulceration(s) every visit Treatment Activities: Skin care regimen initiated : 05/23/2023 Notes: Electronic Signature(s) Signed: 05/30/2023 4:35:14 PM By: Karie Schwalbe RN Entered By: Karie Schwalbe on 05/30/2023 16:06:55 -------------------------------------------------------------------------------- Pain Assessment Details Patient  Name: Date of Service: Jessica Sos [name]. 05/30/2023 1:00 PM Medical Record Number: [id number] Patient Account Number: [id number] Date of Birth/Sex: Treating RN: 1941-04-20 (82 y.o. F) Primary Care Rosemae Mcquown: Ardean Larsen Other Clinician: Referring Shareta Fishbaugh: Treating Dmetrius Ambs/Extender: Juventino Slovak, Rinka Weeks in Treatment: 1 Active Problems Location of Pain Severity and Description of Pain Patient Has Paino No Site Locations Pain Management and Medication Current Pain Management: Jessica [name], Jessica [name] ([id number]) [phone].pdf Page 5 of 7 Electronic Signature(s) Signed: 05/30/2023 1:23:39 PM By: Dayton Scrape Entered By: Dayton Scrape on 05/30/2023 12:50:28 -------------------------------------------------------------------------------- Patient/Caregiver Education Details Patient Name: Date of Service: Jessica [name] 10/1/2024andnbsp1:00 PM Medical Record Number: [id number] Patient Account Number: [id number] Date of Birth/Gender: Treating RN: 1941/06/23 (82 y.o. [name] Primary Care Physician: Ardean Larsen Other Clinician: Referring Physician: Treating Physician/Extender: Alfonse Ras in Treatment: 1 Education Assessment Education Provided To: Patient Education Topics Provided Wound/Skin Impairment: Methods: Explain/Verbal Responses: State content correctly Electronic Signature(s) Signed: 05/30/2023 4:35:14 PM By: Karie Schwalbe RN Entered  By: Karie Schwalbe on 05/30/2023 16:07:32 -------------------------------------------------------------------------------- Wound Assessment Details Patient Name: Date of Service: Jessica [name] 05/30/2023 1:00 PM Medical Record Number: [id number] Patient Account Number: [id number] Date of Birth/Sex: Treating RN: June 16, 1941 (82 y.o. F) Primary Care Iracema Lanagan: Ardean Larsen Other Clinician: Referring Neddie Steedman: Treating Jemal Miskell/Extender:  Juventino Slovak, Rinka Weeks in Treatment: 1 Wound Status Wound Number: 1 Primary Etiology: Trauma, Other Wound Location: Left Lower Leg Wound Status: Open Wounding Event: Trauma Comorbid History: Glaucoma, Hypertension Date Acquired: 12/28/2022 Weeks Of Treatment: 1 Clustered Wound: No Photos Jessica [name], Jessica [name] ([id number]) [id number] Page 6 of 7 Wound Measurements Length: (cm) 1 Width: (cm) 0.5 Depth: (cm) 0.1 Area: (cm) 0.393 Volume: (cm) 0.039 % Reduction in Area: 62.9% % Reduction in Volume: 63.2% Epithelialization: Small (1-33%) Tunneling: No Undermining: No Wound Description Classification: Full Thickness Without Exposed Support Structures Wound Margin: Distinct, outline attached Exudate Amount: Medium Exudate Type: Serosanguineous Exudate Color: red, brown Foul Odor After Cleansing: No Slough/Fibrino Yes Wound Bed Granulation Amount: Small (1-33%) Exposed Structure Granulation Quality: Pink Fascia Exposed: No Necrotic Amount: Large (67-100%) Fat Layer (Subcutaneous Tissue) Exposed: Yes Necrotic Quality: Eschar, Adherent Slough Tendon Exposed: No Muscle Exposed: No Joint Exposed: No Bone Exposed: No Periwound Skin Texture Texture Color No Abnormalities Noted: Yes No Abnormalities Noted: Yes Moisture Temperature / Pain No Abnormalities Noted: No Temperature: No Abnormality Dry / Scaly: No Maceration: No Treatment Notes Wound #1 (Lower Leg) Wound Laterality: Left Cleanser Soap and Water Discharge Instruction: May shower and wash wound with dial antibacterial soap and water prior to dressing change. Vashe 5.8 (oz) Discharge Instruction: Cleanse the wound with Vashe prior to applying a clean dressing using gauze sponges, not tissue or cotton balls. Peri-Wound Care Topical Gentamicin Discharge Instruction: As directed by physician Mupirocin Ointment Discharge Instruction: Apply Mupirocin (Bactroban) as  instructed Primary Dressing Promogran Prisma Matrix, 4.34 (sq in) (silver collagen) Discharge Instruction: Moisten collagen with saline or hydrogel Secondary Dressing Woven Gauze Sponge, Non-Sterile 4x4 in Discharge Instruction: Apply over primary dressing as directed. Secured With [employer] 4x5 (in/yd) Discharge Instruction: Secure with Coban as directed. Jessica [name], Jessica [name] ([id number]) [id number] Page 7 of 7 Kerlix Roll Sterile, 4.5x3.1 (in/yd) Discharge Instruction: Secure with Kerlix as directed. Transpore Surgical Tape, 2x10 (in/yd) Discharge Instruction: Secure dressing with tape as directed. Tubular Netting #5 Compression Wrap Compression Stockings Add-Ons Electronic Signature(s) Signed: 05/30/2023 4:35:14 PM By: Karie Schwalbe RN Entered By: Karie Schwalbe on 05/30/2023 13:09:39 -------------------------------------------------------------------------------- Vitals Details Patient Name: Date of Service: Jessica Sos [name]. 05/30/2023 1:00 PM Medical Record Number: [id number] Patient Account Number: [id number] Date of Birth/Sex: Treating RN: Dec 21, 1940 (82 y.o. F) Primary Care Jacobe Study: Ardean Larsen Other Clinician: Referring Brynnlie Unterreiner: Treating Mrytle Bento/Extender: Juventino Slovak, Rinka Weeks in Treatment: 1 Vital Signs Time Taken: 12:50 Temperature (F): 97.9 Height (in): 60 Pulse (bpm): 73 Weight (lbs): 98 Respiratory Rate (breaths/min): 18 Body Mass Index (BMI): 19.1 Blood Pressure (mmHg): 121/70 Reference Range: 80 - 120 mg / dl Electronic Signature(s) Signed: 05/30/2023 1:23:39 PM By: Dayton Scrape Entered By: Dayton Scrape on 05/30/2023 12:50:22

## 2023-05-31 ENCOUNTER — Ambulatory Visit: Payer: Medicare PPO | Admitting: Orthopaedic Surgery

## 2023-06-06 ENCOUNTER — Encounter (HOSPITAL_BASED_OUTPATIENT_CLINIC_OR_DEPARTMENT_OTHER): Payer: Medicare PPO | Admitting: General Surgery

## 2023-06-06 DIAGNOSIS — S81802A Unspecified open wound, left lower leg, initial encounter: Secondary | ICD-10-CM | POA: Diagnosis not present

## 2023-06-06 DIAGNOSIS — E039 Hypothyroidism, unspecified: Secondary | ICD-10-CM | POA: Diagnosis not present

## 2023-06-06 DIAGNOSIS — I10 Essential (primary) hypertension: Secondary | ICD-10-CM | POA: Diagnosis not present

## 2023-06-06 DIAGNOSIS — I2510 Atherosclerotic heart disease of native coronary artery without angina pectoris: Secondary | ICD-10-CM | POA: Diagnosis not present

## 2023-06-06 DIAGNOSIS — L97822 Non-pressure chronic ulcer of other part of left lower leg with fat layer exposed: Secondary | ICD-10-CM | POA: Diagnosis not present

## 2023-06-06 NOTE — Progress Notes (Addendum)
Jessica, [name] ([id number]) [id number] Page 1 of 8 Visit Report for 06/06/2023 Chief Complaint Document Details Patient Name: Date of Service: Jessica [name] 06/06/2023 7:45 A M Medical Record Number: [id number] Patient Account Number: [id number] Date of Birth/Sex: Treating RN: 1941-05-09 (82 y.o. F) Primary Care Provider: Ardean Larsen Other Clinician: Referring Provider: Treating Provider/Extender: Juventino Slovak, Rinka Weeks in Treatment: 2 Information Obtained from: Patient Chief Complaint Patient seen for complaints of Non-Healing Wound. Electronic Signature(s) Signed: 06/06/2023 7:55:20 AM By: Duanne Guess MD FACS Entered By: Duanne Guess on 06/06/2023 07:55:20 -------------------------------------------------------------------------------- Debridement Details Patient Name: Date of Service: Jessica [name]. 06/06/2023 7:45 A M Medical Record Number: [id number] Patient Account Number: [id number] Date of Birth/Sex: Treating RN: Sep 02, 1940 (82 y.o. Jessica [name] Primary Care Provider: Ardean Larsen Other Clinician: Referring Provider: Treating Provider/Extender: Alfonse Ras in Treatment: 2 Debridement Performed for Assessment: Wound #1 Left Lower Leg Performed By: Physician Duanne Guess, MD The following information was scribed by: Samuella Bruin The information was scribed for: Duanne Guess Debridement Type: Debridement Level of Consciousness (Pre-procedure): Awake and Alert Pre-procedure Verification/Time Out Yes - 07:59 Taken: Start Time: 07:59 Pain Control: Lidocaine 5% topical ointment Percent of Wound Bed Debrided: 100% T Area Debrided (cm): otal 0.19 Tissue and other material debrided: Non-Viable, Eschar, Slough, Slough Level: Non-Viable Tissue Debridement Description: Selective/Open Wound Instrument: Curette Bleeding: Minimum Hemostasis Achieved:  Pressure Response to Treatment: Procedure was tolerated well Level of Consciousness (Post- Awake and Alert procedure): Post Debridement Measurements of Total Wound Length: (cm) 0.8 Width: (cm) 0.3 Depth: (cm) 0.2 Volume: (cm) 0.038 Character of Wound/Ulcer Post Debridement: Improved Post Procedure Diagnosis Jessica [name], Jessica [name] ([id number]) [id number] Page 2 of 8 Same as Pre-procedure Electronic Signature(s) Signed: 06/06/2023 9:13:05 AM By: Duanne Guess MD FACS Signed: 06/06/2023 3:15:01 PM By: Gelene Mink By: Samuella Bruin on 06/06/2023 08:00:52 -------------------------------------------------------------------------------- HPI Details Patient Name: Date of Service: Jessica [name]. 06/06/2023 7:45 A M Medical Record Number: [id number] Patient Account Number: [id number] Date of Birth/Sex: Treating RN: 29-May-1941 (82 y.o. F) Primary Care Provider: Ardean Larsen Other Clinician: Referring Provider: Treating Provider/Extender: Juventino Slovak, Rinka Weeks in Treatment: 2 History of Present Illness HPI Description: ADMISSION 05/23/2023 ***PATIENT UNABLE TO TOLERATE CUFFS FOR ABI*** This is a relatively healthy 82 year old woman, non-smoker and nondiabetic, with primarily orthopedic issues in her past medical history. She was referred to the clinic today by her primary care doctor to address a left lower leg wound. The wound apparently occurred in May while she was gardening. She is uncertain exactly how it happened but perhaps struck her leg on a stick or similar protruding structure. She completed a course of both clindamycin and Augmentin based upon a wound swab culture that grew methicillin sensitive Staph aureus. She has been applying some triamcinolone cream that she had from a previous wound care admission at the [facility] wound care center. 05/30/2023: The wound measured smaller today. It is also cleaner,  with just a little bit of slough on the surface. No significant periwound erythema. 06/06/2023: The wound is smaller again today. There is minimal slough on the surface. There is some dry eschar around the wound edges. Edema control is excellent. Electronic Signature(s) Signed: 06/06/2023 8:02:39 AM By: Duanne Guess MD FACS Entered By: Duanne Guess on 06/06/2023 08:02:39 -------------------------------------------------------------------------------- Physical Exam Details Patient Name: Date of Service: Jessica [name]. 06/06/2023 7:45 A M Medical Record  Number: [id number] Patient Account Number: [id number] Date of Birth/Sex: Treating RN: 20-Feb-1941 (82 y.o. F) Primary Care Provider: Ardean Larsen Other Clinician: Referring Provider: Treating Provider/Extender: Juventino Slovak, Rinka Weeks in Treatment: 2 Constitutional . . . . no acute distress. Respiratory Normal work of breathing on room air. Notes 06/06/2023: The wound is smaller again today. There is minimal slough on the surface. There is some dry eschar around the wound edges. Edema control is excellent. Electronic Signature(s) Signed: 06/06/2023 8:03:15 AM By: Duanne Guess MD FACS Jessica [name] ([id number]) [id number] Page 3 of 8 Entered By: Duanne Guess on 06/06/2023 08:03:15 -------------------------------------------------------------------------------- Physician Orders Details Patient Name: Date of Service: Jessica [name] 06/06/2023 7:45 A M Medical Record Number: [id number] Patient Account Number: [id number] Date of Birth/Sex: Treating RN: 17-Jul-1941 (82 y.o. Jessica [name] Primary Care Provider: Ardean Larsen Other Clinician: Referring Provider: Treating Provider/Extender: Alfonse Ras in Treatment: 2 The following information was scribed by: Samuella Bruin The information was scribed for: Duanne Guess Verbal / Phone Orders: No Diagnosis Coding ICD-10 Coding Code Description [phone] Non-pressure chronic ulcer of other part of left lower leg with fat layer exposed Follow-up Appointments ppointment in 1 week. - Dr. Lady Gary Room 3 06/13/23 at 9:30am Return A ppointment in 2 weeks. - Dr. Lady Gary Room 3 06/20/23 at 11am Return A Anesthetic (In clinic) Topical Lidocaine 5% applied to wound bed Bathing/ Shower/ Hygiene May shower and wash wound with soap and water. - Keep Left leg wrap dry. Use a cast protector to keep left leg dry. Cast Protector can be purchased from [employer], [place], Medical supply stores etc. Cost ranges between $17-$30 Edema Control - Lymphedema / SCD / Other Left Lower Extremity Elevate legs to the level of the heart or above for 30 minutes daily and/or when sitting for 3-4 times a day throughout the day. - When sitting please elevate legs. Avoid standing for long periods of time. Exercise regularly - As tolerated Additional Orders / Instructions Follow Nutritious Diet - Try and increase protein to 70g-100g per day. Wound Treatment Wound #1 - Lower Leg Wound Laterality: Left Cleanser: Soap and Water 1 x Per Week/30 Days Discharge Instructions: May shower and wash wound with dial antibacterial soap and water prior to dressing change. Cleanser: Vashe 5.8 (oz) 1 x Per Week/30 Days Discharge Instructions: Cleanse the wound with Vashe prior to applying a clean dressing using gauze sponges, not tissue or cotton balls. Peri-Wound Care: Sween Lotion (Moisturizing lotion) 1 x Per Week/30 Days Discharge Instructions: Apply moisturizing lotion as directed Topical: Gentamicin 1 x Per Week/30 Days Discharge Instructions: As directed by physician Topical: Mupirocin Ointment 1 x Per Week/30 Days Discharge Instructions: Apply Mupirocin (Bactroban) as instructed Prim Dressing: Promogran Prisma Matrix, 4.34 (sq in) (silver collagen) 1 x Per Week/30 Days ary Discharge  Instructions: Moisten collagen with saline or hydrogel Secondary Dressing: Woven Gauze Sponge, Non-Sterile 4x4 in 1 x Per Week/30 Days Discharge Instructions: Apply over primary dressing as directed. Secured With: Coban Self-Adherent Wrap 4x5 (in/yd) 1 x Per Week/30 Days Discharge Instructions: Secure with Coban as directed. Secured With: [employer], 4.5x3.1 (in/yd) 1 x Per Week/30 Days Discharge Instructions: Secure with Kerlix as directed. Jessica [name], Jessica [name] ([id number]) [id number] Page 4 of 8 Secured With: Transpore Surgical Tape, 2x10 (in/yd) 1 x Per Week/30 Days Discharge Instructions: Secure dressing with tape as directed. Secured With: Tubular Netting #5 1 x Per Week/30 Days Patient Medications llergies: Gluten Protein A Notifications  Medication Indication Start End 06/06/2023 lidocaine DOSE topical 5 % ointment - ointment topical Electronic Signature(s) Signed: 06/06/2023 9:13:05 AM By: Duanne Guess MD FACS Entered By: Duanne Guess on 06/06/2023 08:03:27 -------------------------------------------------------------------------------- Problem List Details Patient Name: Date of Service: Jessica [name]. 06/06/2023 7:45 A M Medical Record Number: [id number] Patient Account Number: [id number] Date of Birth/Sex: Treating RN: 1941/04/18 (82 y.o. 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 06/06/2023 7:45 A M Medical Record Number: [id number] Patient Account Number: [id number] Date of Birth/Sex: Treating RN: 04-04-1941 (82 y.o. F) Primary Care Provider: Ardean Larsen Other Clinician: Referring Provider: Treating Provider/Extender: Juventino Slovak, Rinka Weeks in Treatment: [address] TIAJA, HAGAN [name] ([id number]) [id number] Page 5 of 8 Chief Complaint Information obtained from Patient Patient seen for complaints of Non-Healing Wound. History of Present Illness (HPI) ADMISSION 05/23/2023 ***PATIENT UNABLE TO TOLERATE CUFFS FOR ABI*** This is a relatively healthy 82 year old woman, non-smoker and nondiabetic, with primarily orthopedic issues in her past medical history. She was referred to the clinic today by her primary care doctor to address a left lower leg wound. The wound apparently occurred in May while she was gardening. She is uncertain exactly how it happened but perhaps struck her leg on a stick or similar protruding structure. She completed a course of both clindamycin and Augmentin based upon a wound swab culture that grew methicillin sensitive Staph aureus. She has been applying some triamcinolone cream that she had from a previous wound care admission at the [facility] wound care center. 05/30/2023: The wound measured smaller today. It is also cleaner, with just a little bit of slough on the surface. No significant periwound erythema. 06/06/2023: The wound is smaller again today. There is minimal slough on the surface. There is some dry eschar around the wound edges. Edema control is excellent. Patient History Information obtained from Patient. Family History Unknown History. Social History Former smoker - ended on 08/30/1975, Marital Status - Divorced,  Alcohol Use - Rarely - wine, Drug Use - No History, Caffeine Use - Daily - coffee1/1/19977. Medical History Eyes Patient has history of Glaucoma Cardiovascular Patient has history of Hypertension Hospitalization/Surgery History - Left Hip Replacement. - Bilateral feet Bunion and Hammer toe surgeries. - Bilateral Carpal tunnel repair. Medical A Surgical History Notes nd Cardiovascular Hx: TIA; Coronary artery disease with angina pectoris Endocrine Hx: Hypothyroidism Musculoskeletal Hx: Scoliosis; Left foot tendon rupture Oncologic Hx: Basal cell carcinoma ( Right cheek 25 years ago) Objective Constitutional no acute distress. Vitals Time Taken: 7:47 AM, Height: 60 in, Weight: 98 lbs, BMI: 19.1, Temperature: 97.8 F, Pulse: 70 bpm, Respiratory Rate: 16 breaths/min, Blood Pressure: 132/76 mmHg. Respiratory Normal work of breathing on room air. General Notes: 06/06/2023: The wound is smaller again today. There is minimal slough on the surface. There is some dry eschar around the wound edges. Edema control is excellent. Integumentary (Hair, Skin) Wound #1 status is Open. Original cause of wound was Trauma. The date acquired was: 12/28/2022. The wound has been in treatment 2 weeks. The wound is  located on the Left Lower Leg. The wound measures 0.8cm length x 0.3cm width x 0.2cm depth; 0.188cm^2 area and 0.038cm^3 volume. There is Fat Layer (Subcutaneous Tissue) exposed. There is no tunneling or undermining noted. There is a medium amount of serosanguineous drainage noted. The wound margin is distinct with the outline attached to the wound base. There is large (67-100%) pink, pale granulation within the wound bed. There is a small (1-33%) amount of necrotic tissue within the wound bed including Eschar and Adherent Slough. The periwound skin appearance had no abnormalities noted for texture. The periwound skin appearance had no abnormalities noted for moisture. The periwound skin appearance had  no abnormalities noted for color. Periwound temperature was noted as No Abnormality. Assessment Jessica [name], Jessica [name] ([id number]) [id number] Page 6 of 8 Active Problems ICD-10 Non-pressure chronic ulcer of other part of left lower leg with fat layer exposed Procedures Wound #1 Pre-procedure diagnosis of Wound #1 is a Trauma, Other located on the Left Lower Leg . There was a Selective/Open Wound Non-Viable Tissue Debridement with a total area of 0.19 sq cm performed by Duanne Guess, MD. With the following instrument(s): Curette to remove Non-Viable tissue/material. Material removed includes Eschar and Slough and after achieving pain control using Lidocaine 5% topical ointment. No specimens were taken. A time out was conducted at 07:59, prior to the start of the procedure. A Minimum amount of bleeding was controlled with Pressure. The procedure was tolerated well. Post Debridement Measurements: 0.8cm length x 0.3cm width x 0.2cm depth; 0.038cm^3 volume. Character of Wound/Ulcer Post Debridement is improved. Post procedure Diagnosis Wound #1: Same as Pre-Procedure Plan Follow-up Appointments: Return Appointment in 1 week. - Dr. Lady Gary Room 3 06/13/23 at 9:30am Return Appointment in 2 weeks. - Dr. Lady Gary Room 3 06/20/23 at 11am Anesthetic: (In clinic) Topical Lidocaine 5% applied to wound bed Bathing/ Shower/ Hygiene: May shower and wash wound with soap and water. - Keep Left leg wrap dry. Use a cast protector to keep left leg dry. Cast Protector can be purchased from [employer], [place], Medical supply stores etc. Cost ranges between $17-$30 Edema Control - Lymphedema / SCD / Other: Elevate legs to the level of the heart or above for 30 minutes daily and/or when sitting for 3-4 times a day throughout the day. - When sitting please elevate legs. Avoid standing for long periods of time. Exercise regularly - As tolerated Additional Orders / Instructions: Follow  Nutritious Diet - Try and increase protein to 70g-100g per day. The following medication(s) was prescribed: lidocaine topical 5 % ointment ointment topical was prescribed at facility WOUND #1: - Lower Leg Wound Laterality: Left Cleanser: Soap and Water 1 x Per Week/30 Days Discharge Instructions: May shower and wash wound with dial antibacterial soap and water prior to dressing change. Cleanser: Vashe 5.8 (oz) 1 x Per Week/30 Days Discharge Instructions: Cleanse the wound with Vashe prior to applying a clean dressing using gauze sponges, not tissue or cotton balls. Peri-Wound Care: Sween Lotion (Moisturizing lotion) 1 x Per Week/30 Days Discharge Instructions: Apply moisturizing lotion as directed Topical: Gentamicin 1 x Per Week/30 Days Discharge Instructions: As directed by physician Topical: Mupirocin Ointment 1 x Per Week/30 Days Discharge Instructions: Apply Mupirocin (Bactroban) as instructed Prim Dressing: Promogran Prisma Matrix, 4.34 (sq in) (silver collagen) 1 x Per Week/30 Days ary Discharge Instructions: Moisten collagen with saline or hydrogel Secondary Dressing: Woven Gauze Sponge, Non-Sterile 4x4 in 1 x Per Week/30 Days Discharge Instructions: Apply over primary dressing as directed. Secured With:  Coban Self-Adherent Wrap 4x5 (in/yd) 1 x Per Week/30 Days Discharge Instructions: Secure with Coban as directed. Secured With: [employer], 4.5x3.1 (in/yd) 1 x Per Week/30 Days Discharge Instructions: Secure with Kerlix as directed. Secured With: Transpore Surgical T ape, 2x10 (in/yd) 1 x Per Week/30 Days Discharge Instructions: Secure dressing with tape as directed. Secured With: Tubular Netting #5 1 x Per Week/30 Days 06/06/2023: The wound is smaller again today. There is minimal slough on the surface. There is some dry eschar around the wound edges. Edema control is excellent. I used a curette to debride slough and eschar from the wound. We will continue the mixture of  topical gentamicin and mupirocin with Prisma silver collagen, Kerlix and Coban wrap. Follow-up in 1 week. Electronic Signature(s) Signed: 06/06/2023 8:03:58 AM By: Duanne Guess MD FACS Entered By: Duanne Guess on 06/06/2023 08:03:57 Jessica [name] ([id number]) [id number] Page 7 of 8 -------------------------------------------------------------------------------- HxROS Details Patient Name: Date of Service: Jessica [name] 06/06/2023 7:45 A M Medical Record Number: [id number] Patient Account Number: [id number] Date of Birth/Sex: Treating RN: 01/14/1941 (82 y.o. F) Primary Care Provider: Ardean Larsen Other Clinician: Referring Provider: Treating Provider/Extender: Juventino Slovak, Rinka Weeks in Treatment: 2 Information Obtained From Patient Eyes Medical History: Positive for: Glaucoma Cardiovascular Medical History: Positive for: Hypertension Past Medical History Notes: Hx: TIA; Coronary artery disease with angina pectoris Endocrine Medical History: Past Medical History Notes: Hx: Hypothyroidism Musculoskeletal Medical History: Past Medical History Notes: Hx: Scoliosis; Left foot tendon rupture Oncologic Medical History: Past Medical History Notes: Hx: Basal cell carcinoma ( Right cheek 25 years ago) HBO Extended History Items Eyes: Glaucoma Immunizations Pneumococcal Vaccine: Received Pneumococcal Vaccination: No Implantable Devices None Hospitalization / Surgery History Type of Hospitalization/Surgery Left Hip Replacement Bilateral feet Bunion and Hammer toe surgeries Bilateral Carpal tunnel repair Family and Social History Unknown History: Yes; Former smoker - ended on 08/30/1975; Marital Status - Divorced; Alcohol Use: Rarely - wine; Drug Use: No History; Caffeine Use: Daily - coffee1/1/19977; Financial Concerns: No; Food, Clothing or Shelter Needs: No; Support System Lacking: No; Transportation  Concerns: No Electronic Signature(s) Signed: 06/06/2023 9:13:05 AM By: Duanne Guess MD FACS Entered By: Duanne Guess on 06/06/2023 08:02:49 Jessica [name] ([id number]) [id number] Page 8 of 8 -------------------------------------------------------------------------------- SuperBill Details Patient Name: Date of Service: Jessica [name] 06/06/2023 Medical Record Number: [id number] Patient Account Number: [id number] Date of Birth/Sex: Treating RN: Jan 13, 1941 (82 y.o. F) Primary Care Provider: Ardean Larsen Other Clinician: Referring Provider: Treating Provider/Extender: Juventino Slovak, Rinka Weeks in Treatment: 2 Diagnosis Coding ICD-10 Codes Code Description [phone] Non-pressure chronic ulcer of other part of left lower leg with fat layer exposed Facility Procedures : CPT4 Code: [id number] Description: 97597 - DEBRIDE WOUND 1ST 20 SQ CM OR < ICD-10 Diagnosis Description L97.822 Non-pressure chronic ulcer of other part of left lower leg with fat layer expose Modifier: d Quantity: 1 Physician Procedures : CPT4 Code Description Modifier [id number] 99214 - WC PHYS LEVEL 4 - EST PT 25 ICD-10 Diagnosis Description L97.822 Non-pressure chronic ulcer of other part of left lower leg with fat layer exposed Quantity: 1 : [id number] 97597 - WC PHYS DEBR WO ANESTH 20 SQ CM ICD-10 Diagnosis Description L97.822 Non-pressure chronic ulcer of other part of left lower leg with fat layer exposed Quantity: 1 Electronic Signature(s) Signed: 06/06/2023 8:04:13 AM By: Duanne Guess MD FACS Entered By: Duanne Guess on 06/06/2023 08:04:13

## 2023-06-06 NOTE — Progress Notes (Signed)
PATRICIE, GEESLIN [name] ([id number]) [id number] Page 1 of 7 Visit Report for 06/06/2023 Arrival Information Details Patient Name: Date of Service: Jessica [name] 06/06/2023 7:45 A M Medical Record Number: [id number] Patient Account Number: [id number] Date of Birth/Sex: Treating RN: [birth year] (82 y.o. Jessica [name], Ladona Ridgel Primary Care Lucendia Leard: Ardean Larsen Other Clinician: Referring Miosotis Wetsel: Treating Marlin Brys/Extender: Alfonse Ras in Treatment: 2 Visit Information History Since Last Visit Added or deleted any medications: No Patient Arrived: Ambulatory Any new allergies or adverse reactions: No Arrival Time: 07:46 Had a fall or experienced change in No Accompanied By: self activities of daily living that may affect Transfer Assistance: None risk of falls: Patient Identification Verified: Yes Signs or symptoms of abuse/neglect since last visito No Secondary Verification Process Completed: Yes Hospitalized since last visit: No Patient Has Alerts: Yes Implantable device outside of the clinic excluding No Patient Alerts: Unable to complete ABI L cellular tissue based products placed in the center since last visit: Has Dressing in Place as Prescribed: Yes Has Compression in Place as Prescribed: Yes Pain Present Now: No Electronic Signature(s) Signed: 06/06/2023 3:15:01 PM By: Samuella Bruin Entered By: Samuella Bruin on 06/06/2023 07:47:45 -------------------------------------------------------------------------------- Encounter Discharge Information Details Patient Name: Date of Service: Jessica Sos [name]. 06/06/2023 7:45 A M Medical Record Number: [id number] Patient Account Number: [id number] Date of Birth/Sex: Treating RN: Aug 21, 1941 (82 y.o. Jessica [name] Primary Care Jodine Muchmore: Ardean Larsen Other Clinician: Referring Tiannah Greenly: Treating Timmi Devora/Extender: Alfonse Ras in Treatment: 2 Encounter Discharge Information Items Post Procedure Vitals Discharge Condition: Stable Temperature (F): 97.8 Ambulatory Status: Ambulatory Pulse (bpm): 70 Discharge Destination: Home Respiratory Rate (breaths/min): 16 Transportation: Private Auto Blood Pressure (mmHg): 132/76 Accompanied By: self Schedule Follow-up Appointment: Yes Clinical Summary of Care: Patient Declined Electronic Signature(s) Signed: 06/06/2023 3:15:01 PM By: Samuella Bruin Entered By: Samuella Bruin on 06/06/2023 08:13:52 [name] ([id number]) [id number] Page 2 of 7 -------------------------------------------------------------------------------- Lower Extremity Assessment Details Patient Name: Date of Service: Jessica [name] 06/06/2023 7:45 A M Medical Record Number: [id number] Patient Account Number: [id number] Date of Birth/Sex: Treating RN: 29-Aug-1941 (82 y.o. Jessica [name] Primary Care Breezie Micucci: Ardean Larsen Other Clinician: Referring Larna Capelle: Treating Neila Teem/Extender: Juventino Slovak, Rinka Weeks in Treatment: 2 Edema Assessment Assessed: [Left: No] [Right: No] Edema: [Left: N] [Right: o] Calf Left: Right: Point of Measurement: 30 cm From Medial Instep 29 cm Ankle Left: Right: Point of Measurement: 9 cm From Medial Instep 15.8 cm Vascular Assessment Pulses: Dorsalis Pedis Palpable: [Left:Yes] Extremity colors, hair growth, and conditions: Extremity Color: [Left:Normal] Temperature of Extremity: [Left:Warm] Capillary Refill: [Left:< 3 seconds] Dependent Rubor: [Left:No No] Electronic Signature(s) Signed: 06/06/2023 3:15:01 PM By: Samuella Bruin Entered By: Samuella Bruin on 06/06/2023 07:51:23 -------------------------------------------------------------------------------- Multi Wound Chart Details Patient Name: Date of Service: Jessica Sos [name]. 06/06/2023 7:45 A  M Medical Record Number: [id number] Patient Account Number: [id number] Date of Birth/Sex: Treating RN: June 17, 1941 (82 y.o. F) Primary Care Nelsie Domino: Ardean Larsen Other Clinician: Referring Osaze Hubbert: Treating Tykee Heideman/Extender: Juventino Slovak, Rinka Weeks in Treatment: 2 Vital Signs Height(in): 60 Pulse(bpm): 70 Weight(lbs): 98 Blood Pressure(mmHg): 132/76 Body Mass Index(BMI): 19.1 Temperature(F): 97.8 Respiratory Rate(breaths/min): 16 [1:Photos: No Photos Left Lower Leg Wound Location: Trauma Wounding Event:] [N/A:N/A N/A N/A] Jessica [name], Jessica [name] ([id number]) [1:Trauma, Other Primary Etiology: Glaucoma, Hypertension Comorbid History: 12/28/2022 Date Acquired: 2 Weeks of Treatment: Open Wound Status: No Wound Recurrence: 0.8x0.3x0.2 Measurements L x  W x D (cm) 0.188 A (cm) : rea 0.038 Volume (cm) : 82.30% %  Reduction in A rea: 64.20% % Reduction in Volume: Full Thickness Without Exposed Classification: Support Structures Medium Exudate A mount: Serosanguineous Exudate Type: red, brown Exudate Color: Distinct, outline attached Wound Margin: Large (67-100%)  Granulation Amount: Pink Granulation Quality: Small (1-33%) Necrotic Amount: Eschar, Adherent Slough Necrotic Tissue: Fat Layer (Subcutaneous Tissue): Yes N/A Exposed Structures: Fascia: No Tendon: No Muscle: No Joint: No Bone: No Small (1-33%)  Epithelialization: No Abnormalities Noted Periwound Skin Texture: Maceration: No Periwound Skin Moisture: Dry/Scaly: No No Abnormalities Noted Periwound Skin Color: No Abnormality Temperature:] [N/A:N/A N/A N/A N/A N/A N/A N/A N/A N/A N/A N/A N/A N/A N/A  N/A N/A N/A N/A N/A N/A N/A N/A N/A N/A N/A] Treatment Notes Electronic Signature(s) Signed: 06/06/2023 7:55:13 AM By: Duanne Guess MD FACS Entered By: Duanne Guess on 06/06/2023 07:55:13 -------------------------------------------------------------------------------- Multi-Disciplinary Care Plan Details Patient Name: Date of  Service: Jessica Sos [name]. 06/06/2023 7:45 A M Medical Record Number: [id number] Patient Account Number: [id number] Date of Birth/Sex: Treating RN: June 05, 1941 (82 y.o. Jessica [name] Primary Care Randie Bloodgood: Ardean Larsen Other Clinician: Referring Jerusha Reising: Treating Legend Pecore/Extender: Juventino Slovak, Rinka Weeks in Treatment: 2 Active Inactive Wound/Skin Impairment Nursing Diagnoses: Impaired tissue integrity Goals: Patient/caregiver will verbalize understanding of skin care regimen Date Initiated: 05/23/2023 Target Resolution Date: 08/23/2023 Goal Status: Active Interventions: Assess ulceration(s) every visit Treatment Activities: Skin care regimen initiated : 05/23/2023 Notes: Jessica [name], Jessica [name] ([id number]) [phone].pdf Page 4 of 7 Electronic Signature(s) Signed: 06/06/2023 3:15:01 PM By: Gelene Mink By: Samuella Bruin on 06/06/2023 08:12:07 -------------------------------------------------------------------------------- Pain Assessment Details Patient Name: Date of Service: Jessica [name] 06/06/2023 7:45 A M Medical Record Number: [id number] Patient Account Number: [id number] Date of Birth/Sex: Treating RN: [birth year] (82 y.o. Jessica [name] Primary Care Keyauna Graefe: Ardean Larsen Other Clinician: Referring Harvy Riera: Treating Sekou Zuckerman/Extender: Juventino Slovak, Rinka Weeks in Treatment: 2 Active Problems Location of Pain Severity and Description of Pain Patient Has Paino No Site Locations Rate the pain. Current Pain Level: 0 Pain Management and Medication Current Pain Management: Electronic Signature(s) Signed: 06/06/2023 3:15:01 PM By: Samuella Bruin Entered By: Samuella Bruin on 06/06/2023 07:48:24 -------------------------------------------------------------------------------- Patient/Caregiver Education Details Patient Name: Date of Service: Jessica [name] 10/8/2024andnbsp7:45 A M Medical Record Number: [id number] Patient Account Number: [id number] Date of Birth/Gender: Treating RN: 1940-11-18 (82 y.o. Jessica [name] Primary Care Physician: Ardean Larsen Other Clinician: Referring Physician: Treating Physician/Extender: Alfonse Ras in Treatment: 2 Education Assessment Education Provided To: Patient Jessica [name], Jessica [name] ([id number]) [id number] Page 5 of 7 Education Topics Provided Wound/Skin Impairment: Methods: Explain/Verbal Responses: Reinforcements needed, State content correctly Electronic Signature(s) Signed: 06/06/2023 3:15:01 PM By: Samuella Bruin Entered By: Samuella Bruin on 06/06/2023 08:13:11 -------------------------------------------------------------------------------- Wound Assessment Details Patient Name: Date of Service: Jessica [name] 06/06/2023 7:45 A M Medical Record Number: [id number] Patient Account Number: [id number] Date of Birth/Sex: Treating RN: [birth year] (82 y.o. Jessica [name] Primary Care Tayler Heiden: Ardean Larsen Other Clinician: Referring Arisbeth Purrington: Treating Makarios Madlock/Extender: Juventino Slovak, Rinka Weeks in Treatment: 2 Wound Status Wound Number: 1 Primary Etiology: Trauma, Other Wound Location: Left Lower Leg Wound Status: Open Wounding Event: Trauma Comorbid History: Glaucoma, Hypertension Date Acquired: 12/28/2022 Weeks Of Treatment: 2 Clustered Wound: No Photos Wound Measurements Length: (cm) 0.8 Width: (cm) 0.3 Depth: (cm) 0.2 Area: (cm) 0.188 Volume: (cm) 0.038 % Reduction  in Area: 82.3% % Reduction in Volume: 64.2% Epithelialization: Small (1-33%) Tunneling: No Undermining: No Wound Description Classification: Full Thickness Without Exposed Support Structures Wound Margin: Distinct, outline attached Exudate Amount: Medium Exudate Type: Serosanguineous Exudate Color: red, brown Foul  Odor After Cleansing: No Slough/Fibrino Yes Wound Bed Granulation Amount: Large (67-100%) Exposed Structure Granulation Quality: Pink, Pale Fascia Exposed: No Necrotic Amount: Small (1-33%) Fat Layer (Subcutaneous Tissue) Exposed: Yes Necrotic Quality: Eschar, Adherent Slough Tendon Exposed: No Muscle Exposed: No Joint Exposed: No Bone Exposed: No Jessica [name], Jessica [name] ([id number]) [id number] Page 6 of 7 Periwound Skin Texture Texture Color No Abnormalities Noted: Yes No Abnormalities Noted: Yes Moisture Temperature / Pain No Abnormalities Noted: Yes Temperature: No Abnormality Treatment Notes Wound #1 (Lower Leg) Wound Laterality: Left Cleanser Soap and Water Discharge Instruction: May shower and wash wound with dial antibacterial soap and water prior to dressing change. Vashe 5.8 (oz) Discharge Instruction: Cleanse the wound with Vashe prior to applying a clean dressing using gauze sponges, not tissue or cotton balls. Peri-Wound Care Sween Lotion (Moisturizing lotion) Discharge Instruction: Apply moisturizing lotion as directed Topical Gentamicin Discharge Instruction: As directed by physician Mupirocin Ointment Discharge Instruction: Apply Mupirocin (Bactroban) as instructed Primary Dressing Promogran Prisma Matrix, 4.34 (sq in) (silver collagen) Discharge Instruction: Moisten collagen with saline or hydrogel Secondary Dressing Woven Gauze Sponge, Non-Sterile 4x4 in Discharge Instruction: Apply over primary dressing as directed. Secured With [employer] 4x5 (in/yd) Discharge Instruction: Secure with Coban as directed. Kerlix Roll Sterile, 4.5x3.1 (in/yd) Discharge Instruction: Secure with Kerlix as directed. Transpore Surgical Tape, 2x10 (in/yd) Discharge Instruction: Secure dressing with tape as directed. Tubular Netting #5 Compression Wrap Compression Stockings Add-Ons Electronic Signature(s) Signed: 06/06/2023 3:15:01 PM  By: Samuella Bruin Entered By: Samuella Bruin on 06/06/2023 07:59:29 -------------------------------------------------------------------------------- Vitals Details Patient Name: Date of Service: Jessica Sos [name]. 06/06/2023 7:45 A M Medical Record Number: [id number] Patient Account Number: [id number] Date of Birth/Sex: Treating RN: Mar 11, 1941 (82 y.o. Jessica [name] Primary Care Obdulio Mash: Ardean Larsen Other Clinician: Referring Foxx Klarich: Treating Shiheem Corporan/Extender: Juventino Slovak, Rinka Weeks in Treatment: 2 Vital Signs Time Taken: 07:47 Temperature (F): 97.8 Jessica [name], Jessica [name] ([id number]) [id number] Page 7 of 7 Height (in): 60 Pulse (bpm): 70 Weight (lbs): 98 Respiratory Rate (breaths/min): 16 Body Mass Index (BMI): 19.1 Blood Pressure (mmHg): 132/76 Reference Range: 80 - 120 mg / dl Electronic Signature(s) Signed: 06/06/2023 3:15:01 PM By: Samuella Bruin Entered By: Samuella Bruin on 06/06/2023 07:48:19

## 2023-06-13 ENCOUNTER — Encounter (HOSPITAL_BASED_OUTPATIENT_CLINIC_OR_DEPARTMENT_OTHER): Payer: Medicare PPO | Admitting: General Surgery

## 2023-06-13 DIAGNOSIS — I10 Essential (primary) hypertension: Secondary | ICD-10-CM | POA: Diagnosis not present

## 2023-06-13 DIAGNOSIS — I2510 Atherosclerotic heart disease of native coronary artery without angina pectoris: Secondary | ICD-10-CM | POA: Diagnosis not present

## 2023-06-13 DIAGNOSIS — S81802A Unspecified open wound, left lower leg, initial encounter: Secondary | ICD-10-CM | POA: Diagnosis not present

## 2023-06-13 DIAGNOSIS — L97822 Non-pressure chronic ulcer of other part of left lower leg with fat layer exposed: Secondary | ICD-10-CM | POA: Diagnosis not present

## 2023-06-13 DIAGNOSIS — E039 Hypothyroidism, unspecified: Secondary | ICD-10-CM | POA: Diagnosis not present

## 2023-06-13 NOTE — Progress Notes (Addendum)
AALIJAH, MIMS [name] ([id number]) [id number] Page 1 of 7 Visit Report for 06/13/2023 Arrival Information Details Patient Name: Date of Service: Jessica [name] 06/13/2023 9:30 A M Medical Record Number: [id number] Patient Account Number: [id number] Date of Birth/Sex: Treating RN: [birth year] (82 y.o. F) Primary Care Morgane Joerger: Ardean Larsen Other Clinician: Referring Ethen Bannan: Treating Cache Decoursey/Extender: Juventino Slovak, Rinka Weeks in Treatment: 3 Visit Information History Since Last Visit Added or deleted any medications: No Patient Arrived: Cane Any new allergies or adverse reactions: No Arrival Time: 09:24 Had a fall or experienced change in No Accompanied By: self activities of daily living that may affect Transfer Assistance: None risk of falls: Patient Identification Verified: Yes Signs or symptoms of abuse/neglect since last visito No Secondary Verification Process Completed: Yes Hospitalized since last visit: No Patient Has Alerts: Yes Implantable device outside of the clinic excluding No Patient Alerts: Unable to complete ABI L cellular tissue based products placed in the center since last visit: Pain Present Now: No Electronic Signature(s) Signed: 06/13/2023 9:36:33 AM By: Dayton Scrape Entered By: Dayton Scrape on 06/13/2023 09:25:17 -------------------------------------------------------------------------------- Encounter Discharge Information Details Patient Name: Date of Service: Jessica Sos [name]. 06/13/2023 9:30 A M Medical Record Number: [id number] Patient Account Number: [id number] Date of Birth/Sex: Treating RN: 1941-01-06 (82 y.o. Jessica [name] Primary Care Ahlia Lemanski: Ardean Larsen Other Clinician: Referring Nini Cavan: Treating Willies Laviolette/Extender: Alfonse Ras in Treatment: 3 Encounter Discharge Information Items Post Procedure Vitals Discharge Condition: Stable Temperature  (F): 98.1 Ambulatory Status: Cane Pulse (bpm): 65 Discharge Destination: Home Respiratory Rate (breaths/min): 18 Transportation: Private Auto Blood Pressure (mmHg): 116/59 Accompanied By: self Schedule Follow-up Appointment: Yes Clinical Summary of Care: Patient Declined Electronic Signature(s) Signed: 06/13/2023 5:08:13 PM By: Karie Schwalbe RN Entered By: Karie Schwalbe on 06/13/2023 16:09:52 Jessica [name] ([id number]) [id number] Page 2 of 7 -------------------------------------------------------------------------------- Lower Extremity Assessment Details Patient Name: Date of Service: Jessica [name] 06/13/2023 9:30 A M Medical Record Number: [id number] Patient Account Number: [id number] Date of Birth/Sex: Treating RN: 1941/01/29 (82 y.o. Jessica [name] Primary Care Bridgett Hattabaugh: Ardean Larsen Other Clinician: Referring Mekel Haverstock: Treating Masie Bermingham/Extender: Juventino Slovak, Rinka Weeks in Treatment: 3 Edema Assessment Assessed: [Left: No] [Right: No] Edema: [Left: N] [Right: o] Calf Left: Right: Point of Measurement: 30 cm From Medial Instep 29 cm Ankle Left: Right: Point of Measurement: 9 cm From Medial Instep 15.8 cm Vascular Assessment Pulses: Dorsalis Pedis Palpable: [Left:Yes] Extremity colors, hair growth, and conditions: Extremity Color: [Left:Normal] Temperature of Extremity: [Left:Warm] Capillary Refill: [Left:< 3 seconds] Dependent Rubor: [Left:No No] Electronic Signature(s) Signed: 06/13/2023 5:08:13 PM By: Karie Schwalbe RN Entered By: Karie Schwalbe on 06/13/2023 10:04:51 -------------------------------------------------------------------------------- Multi Wound Chart Details Patient Name: Date of Service: Jessica Sos [name]. 06/13/2023 9:30 A M Medical Record Number: [id number] Patient Account Number: [id number] Date of Birth/Sex: Treating RN: [birth year] (82 y.o. F) Primary Care  Thelmer Legler: Ardean Larsen Other Clinician: Referring Vilas Edgerly: Treating Shelley Cocke/Extender: Juventino Slovak, Rinka Weeks in Treatment: 3 Vital Signs Height(in): 60 Pulse(bpm): 65 Weight(lbs): 98 Blood Pressure(mmHg): 116/59 Body Mass Index(BMI): 19.1 Temperature(F): 98.1 Respiratory Rate(breaths/min): 18 [1:Photos:] [N/A:N/A] Left Lower Leg N/A N/A Wound Location: Trauma N/A N/A Wounding Event: Trauma, Other N/A N/A Primary Etiology: Glaucoma, Hypertension N/A N/A Comorbid History: 12/28/2022 N/A N/A Date Acquired: 3 N/A N/A Weeks of Treatment: Open N/A N/A Wound Status: No N/A N/A Wound Recurrence: 1x0.5x0.2 N/A N/A Measurements L x W x D (cm)  0.393 N/A N/A A (cm) : rea 0.079 N/A N/A Volume (cm) : 62.90% N/A N/A % Reduction in A rea: 25.50% N/A N/A % Reduction in Volume: Full Thickness Without Exposed N/A N/A Classification: Support Structures Medium N/A N/A Exudate A mount: Serosanguineous N/A N/A Exudate Type: red, brown N/A N/A Exudate Color: Distinct, outline attached N/A N/A Wound Margin: Large (67-100%) N/A N/A Granulation Amount: Pink, Pale N/A N/A Granulation Quality: Small (1-33%) N/A N/A Necrotic Amount: Eschar, Adherent Slough N/A N/A Necrotic Tissue: Fat Layer (Subcutaneous Tissue): Yes N/A N/A Exposed Structures: Fascia: No Tendon: No Muscle: No Joint: No Bone: No Small (1-33%) N/A N/A Epithelialization: No Abnormalities Noted N/A N/A Periwound Skin Texture: Maceration: No N/A N/A Periwound Skin Moisture: Dry/Scaly: No No Abnormalities Noted N/A N/A Periwound Skin Color: No Abnormality N/A N/A Temperature: Treatment Notes Electronic Signature(s) Signed: 06/13/2023 10:01:11 AM By: Duanne Guess MD FACS Entered By: Duanne Guess on 06/13/2023 10:01:10 -------------------------------------------------------------------------------- Multi-Disciplinary Care Plan Details Patient Name: Date of Service: Jessica Sos [name]. 06/13/2023 9:30 A M Medical Record Number: [id number] Patient Account Number: [id number] Date of Birth/Sex: Treating RN: [birth year] (82 y.o. Jessica [name] Primary Care Mylynn Dinh: Ardean Larsen Other Clinician: Referring Noelly Lasseigne: Treating Camey Edell/Extender: Juventino Slovak, Rinka Weeks in Treatment: 3 Active Inactive Wound/Skin Impairment Nursing Diagnoses: Impaired tissue integrity Goals: Patient/caregiver will verbalize understanding of skin care regimen Date Initiated: 05/23/2023 Target Resolution Date: 08/23/2023 Goal Status: Active Jessica [name], Jessica [name] ([id number]) [id number].pdf Page 4 of 7 Interventions: Assess ulceration(s) every visit Treatment Activities: Skin care regimen initiated : 05/23/2023 Notes: Electronic Signature(s) Signed: 06/13/2023 5:08:13 PM By: Karie Schwalbe RN Entered By: Karie Schwalbe on 06/13/2023 16:08:40 -------------------------------------------------------------------------------- Pain Assessment Details Patient Name: Date of Service: Jessica Sos [name]. 06/13/2023 9:30 A M Medical Record Number: [id number] Patient Account Number: [id number] Date of Birth/Sex: Treating RN: 1941/03/29 (82 y.o. F) Primary Care Geran Haithcock: Ardean Larsen Other Clinician: Referring Vear Staton: Treating Azalia Neuberger/Extender: Juventino Slovak, Rinka Weeks in Treatment: 3 Active Problems Location of Pain Severity and Description of Pain Patient Has Paino No Site Locations Pain Management and Medication Current Pain Management: Electronic Signature(s) Signed: 06/13/2023 9:36:33 AM By: Dayton Scrape Entered By: Dayton Scrape on 06/13/2023 09:25:45 -------------------------------------------------------------------------------- Patient/Caregiver Education Details Patient Name: Date of Service: Jessica [name] 10/15/2024andnbsp9:30 A M Medical Record Number: [id number] Patient Account Number:  [id number] Date of Birth/Gender: Treating RN: [birth year] (82 y.o. Jessica [name] Primary Care Physician: Ardean Larsen Other Clinician: RASHAE, Jessica [name] ([id number]) [id number] Page 5 of 7 Referring Physician: Treating Physician/Extender: Alfonse Ras in Treatment: 3 Education Assessment Education Provided To: Patient Education Topics Provided Wound/Skin Impairment: Methods: Explain/Verbal Responses: State content correctly Electronic Signature(s) Signed: 06/13/2023 5:08:13 PM By: Karie Schwalbe RN Entered By: Karie Schwalbe on 06/13/2023 16:08:54 -------------------------------------------------------------------------------- Wound Assessment Details Patient Name: Date of Service: Jessica Sos [name]. 06/13/2023 9:30 A M Medical Record Number: [id number] Patient Account Number: [id number] Date of Birth/Sex: Treating RN: 1940/12/25 (82 y.o. F) Primary Care Jayron Maqueda: Ardean Larsen Other Clinician: Referring Johnmichael Melhorn: Treating Amadou Katzenstein/Extender: Juventino Slovak, Rinka Weeks in Treatment: 3 Wound Status Wound Number: 1 Primary Etiology: Trauma, Other Wound Location: Left Lower Leg Wound Status: Open Wounding Event: Trauma Comorbid History: Glaucoma, Hypertension Date Acquired: 12/28/2022 Weeks Of Treatment: 3 Clustered Wound: No Photos Wound Measurements Length: (cm) 0.7 Width: (cm) 0.3 Depth: (cm) 0.2 Area: (cm) 0.165 Volume: (cm) 0.033 % Reduction in Area: 84.4% % Reduction in  Volume: 68.9% Epithelialization: Small (1-33%) Tunneling: No Undermining: No Wound Description Classification: Full Thickness Without Exposed Support Wound Margin: Distinct, outline attached Exudate Amount: Medium Exudate Type: Serosanguineous Exudate Color: red, brown Structures Foul Odor After Cleansing: No Slough/Fibrino Yes Wound Bed Jessica [name], Jessica [name] ([id number]) [id number] Page 6  of 7 Granulation Amount: Large (67-100%) Exposed Structure Granulation Quality: Pink, Pale Fascia Exposed: No Necrotic Amount: Small (1-33%) Fat Layer (Subcutaneous Tissue) Exposed: Yes Necrotic Quality: Eschar, Adherent Slough Tendon Exposed: No Muscle Exposed: No Joint Exposed: No Bone Exposed: No Periwound Skin Texture Texture Color No Abnormalities Noted: Yes No Abnormalities Noted: Yes Moisture Temperature / Pain No Abnormalities Noted: Yes Temperature: No Abnormality Treatment Notes Wound #1 (Lower Leg) Wound Laterality: Left Cleanser Soap and Water Discharge Instruction: May shower and wash wound with dial antibacterial soap and water prior to dressing change. Vashe 5.8 (oz) Discharge Instruction: Cleanse the wound with Vashe prior to applying a clean dressing using gauze sponges, not tissue or cotton balls. Peri-Wound Care Sween Lotion (Moisturizing lotion) Discharge Instruction: Apply moisturizing lotion as directed Topical Gentamicin Discharge Instruction: As directed by physician Mupirocin Ointment Discharge Instruction: Apply Mupirocin (Bactroban) as instructed Primary Dressing Endoform 2x2 in Discharge Instruction: Moisten with saline Secondary Dressing Woven Gauze Sponge, Non-Sterile 4x4 in Discharge Instruction: Apply over primary dressing as directed. Secured With [employer] 4x5 (in/yd) Discharge Instruction: Secure with Coban as directed. Kerlix Roll Sterile, 4.5x3.1 (in/yd) Discharge Instruction: Secure with Kerlix as directed. Transpore Surgical Tape, 2x10 (in/yd) Discharge Instruction: Secure dressing with tape as directed. Tubular Netting #5 Compression Wrap Compression Stockings Add-Ons Electronic Signature(s) Signed: 06/13/2023 5:08:13 PM By: Karie Schwalbe RN Previous Signature: 06/13/2023 9:36:33 AM Version By: Dayton Scrape Entered By: Karie Schwalbe on 06/13/2023  10:05:38 -------------------------------------------------------------------------------- Vitals Details Patient Name: Date of Service: Jessica [name] 06/13/2023 9:30 A Jessica [name] ([id number]) [id number] Page 7 of 7 Medical Record Number: [id number] Patient Account Number: [id number] Date of Birth/Sex: Treating RN: Mar 08, 1941 (82 y.o. F) Primary Care Trystyn Sitts: Ardean Larsen Other Clinician: Referring Bonni Neuser: Treating Nnenna Meador/Extender: Juventino Slovak, Rinka Weeks in Treatment: 3 Vital Signs Time Taken: 09:25 Temperature (F): 98.1 Height (in): 60 Pulse (bpm): 65 Weight (lbs): 98 Respiratory Rate (breaths/min): 18 Body Mass Index (BMI): 19.1 Blood Pressure (mmHg): 116/59 Reference Range: 80 - 120 mg / dl Electronic Signature(s) Signed: 06/13/2023 9:36:33 AM By: Dayton Scrape Entered By: Dayton Scrape on 06/13/2023 09:25:40

## 2023-06-13 NOTE — Progress Notes (Addendum)
JOHNNETTA, Jessica [name] ([id number]) [id number] Page 1 of 8 Visit Report for 06/13/2023 Chief Complaint Document [name] Patient Name: Date of Service: Jessica [name] 06/13/2023 9:30 A M Medical Record Number: [id number] Patient Account Number: [id number] Date of Birth/Sex: Treating RN: 1941-07-29 (82 y.o. F) Primary Care Provider: Ardean Larsen Other Clinician: Referring Provider: Treating Provider/Extender: Juventino Slovak, Rinka Weeks in Treatment: 3 Information Obtained from: Patient Chief Complaint Patient seen for complaints of Non-Healing Wound. Electronic Signature(s) Signed: 06/13/2023 10:01:16 AM By: Duanne Guess MD FACS Entered By: Duanne Guess on 06/13/2023 10:01:16 -------------------------------------------------------------------------------- Debridement [name] Patient Name: Date of Service: Jessica [name]. 06/13/2023 9:30 A M Medical Record Number: [id number] Patient Account Number: [id number] Date of Birth/Sex: Treating RN: [birth year] (82 y.o. Jessica [name] Primary Care Provider: Ardean Larsen Other Clinician: Referring Provider: Treating Provider/Extender: Alfonse Ras in Treatment: 3 Debridement Performed for Assessment: Wound #1 Left Lower Leg Performed By: Physician Duanne Guess, MD The following information was scribed by: Karie Schwalbe The information was scribed for: Duanne Guess Debridement Type: Debridement Level of Consciousness (Pre-procedure): Awake and Alert Pre-procedure Verification/Time Out Yes - 10:07 Taken: Start Time: 10:07 Pain Control: Lidocaine 4% T opical Solution Percent of Wound Bed Debrided: 100% T Area Debrided (cm): otal 0.16 Tissue and other material debrided: Viable, Non-Viable, Slough, Skin: Dermis , Skin: Epidermis, Slough Level: Skin/Epidermis Debridement Description: Selective/Open Wound Instrument: Curette Bleeding:  Minimum Hemostasis Achieved: Pressure End Time: 10:10 Procedural Pain: 0 Post Procedural Pain: 0 Response to Treatment: Procedure was tolerated well Level of Consciousness (Post- Awake and Alert procedure): Post Debridement Measurements of Total Wound Length: (cm) 0.7 Width: (cm) 0.3 Depth: (cm) 0.2 Volume: (cm) 0.033 Jessica [name], Jessica [name] ([id number]) [id number] Page 2 of 8 Character of Wound/Ulcer Post Debridement: Improved Post Procedure Diagnosis Same as Pre-procedure Electronic Signature(s) Signed: 06/13/2023 10:30:15 AM By: Duanne Guess MD FACS Signed: 06/13/2023 5:08:13 PM By: Karie Schwalbe RN Entered By: Karie Schwalbe on 06/13/2023 10:13:04 -------------------------------------------------------------------------------- HPI [name] Patient Name: Date of Service: Jessica [name]. 06/13/2023 9:30 A M Medical Record Number: [id number] Patient Account Number: [id number] Date of Birth/Sex: Treating RN: 1940/10/17 (82 y.o. F) Primary Care Provider: Ardean Larsen Other Clinician: Referring Provider: Treating Provider/Extender: Juventino Slovak, Rinka Weeks in Treatment: 3 History of Present Illness HPI Description: ADMISSION 05/23/2023 ***PATIENT UNABLE TO TOLERATE CUFFS FOR ABI*** This is a relatively healthy 82 year old woman, non-smoker and nondiabetic, with primarily orthopedic issues in her past medical history. She was referred to the clinic today by her primary care doctor to address a left lower leg wound. The wound apparently occurred in May while she was gardening. She is uncertain exactly how it happened but perhaps struck her leg on a stick or similar protruding structure. She completed a course of both clindamycin and Augmentin based upon a wound swab culture that grew methicillin sensitive Staph aureus. She has been applying some triamcinolone cream that she had from a previous wound care admission at the  [facility] wound care center. 05/30/2023: The wound measured smaller today. It is also cleaner, with just a little bit of slough on the surface. No significant periwound erythema. 06/06/2023: The wound is smaller again today. There is minimal slough on the surface. There is some dry eschar around the wound edges. Edema control is excellent. 06/13/2023: The wound is just the slightest bit smaller today. The skin edges do appear to be starting to roll inward. Edema control is  good. Electronic Signature(s) Signed: 06/13/2023 10:15:06 AM By: Duanne Guess MD FACS Entered By: Duanne Guess on 06/13/2023 10:15:05 -------------------------------------------------------------------------------- Physical Exam [name] Patient Name: Date of Service: Jessica [name]. 06/13/2023 9:30 A M Medical Record Number: [id number] Patient Account Number: [id number] Date of Birth/Sex: Treating RN: 1941/04/25 (82 y.o. F) Primary Care Provider: Ardean Larsen Other Clinician: Referring Provider: Treating Provider/Extender: Juventino Slovak, Rinka Weeks in Treatment: 3 Constitutional . . . . no acute distress. Respiratory Normal work of breathing on room air. Notes Jessica [name], Jessica [name] ([id number]) [id number] Page 3 of 8 06/13/2023: The wound is just the slightest bit smaller today. The skin edges do appear to be starting to roll inward. Edema control is good. Electronic Signature(s) Signed: 06/13/2023 10:15:49 AM By: Duanne Guess MD FACS Entered By: Duanne Guess on 06/13/2023 10:15:49 -------------------------------------------------------------------------------- Physician Orders [name] Patient Name: Date of Service: Jessica [name]. 06/13/2023 9:30 A M Medical Record Number: [id number] Patient Account Number: [id number] Date of Birth/Sex: Treating RN: [birth year] (82 y.o. Jessica [name] Primary Care Provider: Ardean Larsen Other  Clinician: Referring Provider: Treating Provider/Extender: Alfonse Ras in Treatment: 3 Verbal / Phone Orders: No Diagnosis Coding ICD-10 Coding Code Description [id number] Non-pressure chronic ulcer of other part of left lower leg with fat layer exposed Follow-up Appointments ppointment in 1 week. - Dr. Lady Gary Room 3 06/20/23 at 8:30am Return A ppointment in 2 weeks. - Please Ask front desk for appointments. Return A Anesthetic (In clinic) Topical Lidocaine 5% applied to wound bed Bathing/ Shower/ Hygiene May shower and wash wound with soap and water. - Keep Left leg wrap dry. Use a cast protector to keep left leg dry. Cast Protector can be purchased from [employer], [place], Medical supply stores etc. Cost ranges between $17-$30 Edema Control - Lymphedema / SCD / Other Left Lower Extremity Elevate legs to the level of the heart or above for 30 minutes daily and/or when sitting for 3-4 times a day throughout the day. - When sitting please elevate legs. Avoid standing for long periods of time. Exercise regularly - As tolerated Additional Orders / Instructions Follow Nutritious Diet - Try and increase protein to 70g-100g per day. Wound Treatment Wound #1 - Lower Leg Wound Laterality: Left Cleanser: Soap and Water 1 x Per Week/30 Days Discharge Instructions: May shower and wash wound with dial antibacterial soap and water prior to dressing change. Cleanser: Vashe 5.8 (oz) 1 x Per Week/30 Days Discharge Instructions: Cleanse the wound with Vashe prior to applying a clean dressing using gauze sponges, not tissue or cotton balls. Peri-Wound Care: Sween Lotion (Moisturizing lotion) 1 x Per Week/30 Days Discharge Instructions: Apply moisturizing lotion as directed Topical: Gentamicin 1 x Per Week/30 Days Discharge Instructions: As directed by physician Topical: Mupirocin Ointment 1 x Per Week/30 Days Discharge Instructions: Apply Mupirocin (Bactroban) as  instructed Prim Dressing: Endoform 2x2 in 1 x Per Week/30 Days ary Discharge Instructions: Moisten with saline Secondary Dressing: Woven Gauze Sponge, Non-Sterile 4x4 in 1 x Per Week/30 Days Discharge Instructions: Apply over primary dressing as directed. Secured With: Coban Self-Adherent Wrap 4x5 (in/yd) 1 x Per Week/30 Days Discharge Instructions: Secure with Coban as directed. Jessica [name], Jessica [name] ([id number]) [id number] Page 4 of 8 Secured With: [employer], 4.5x3.1 (in/yd) 1 x Per Week/30 Days Discharge Instructions: Secure with Kerlix as directed. Secured With: Transpore Surgical Tape, 2x10 (in/yd) 1 x Per Week/30 Days Discharge Instructions: Secure dressing with tape as directed. Secured With:  Tubular Netting #5 1 x Per Week/30 Days Electronic Signature(s) Signed: 06/13/2023 10:30:15 AM By: Duanne Guess MD FACS Entered By: Duanne Guess on 06/13/2023 10:16:03 -------------------------------------------------------------------------------- Problem List [name] Patient Name: Date of Service: Jessica [name]. 06/13/2023 9:30 A M Medical Record Number: [id number] Patient Account Number: [id number] Date of Birth/Sex: Treating RN: [birth year] (82 y.o. F) Primary Care Provider: Ardean Larsen Other Clinician: Referring Provider: Treating Provider/Extender: Juventino Slovak, Rinka Weeks in Treatment: 3 Active Problems ICD-10 Encounter Code Description Active Date MDM Diagnosis [id number] Non-pressure chronic ulcer of other part of left lower leg with fat layer exposed9/24/2024 No Yes Inactive Problems Resolved Problems Electronic Signature(s) Signed: 06/13/2023 10:01:00 AM By: Duanne Guess MD FACS Entered By: Duanne Guess on 06/13/2023 10:01:00 -------------------------------------------------------------------------------- Progress Note [name] Patient Name: Date of Service: Jessica [name]. 06/13/2023  9:30 A M Medical Record Number: [id number] Patient Account Number: [id number] Date of Birth/Sex: Treating RN: 09/03/1940 (83 y.o. F) Primary Care Provider: Ardean Larsen Other Clinician: Referring Provider: Treating Provider/Extender: Juventino Slovak, Rinka Weeks in Treatment: 3 Subjective Chief Complaint Information obtained from Patient Patient seen for complaints of Non-Healing Wound. History of Present Illness (HPI) Jessica [name], Jessica [name] ([id number]) [id number] Page 5 of 8 ADMISSION 05/23/2023 ***PATIENT UNABLE TO TOLERATE CUFFS FOR ABI*** This is a relatively healthy 82 year old woman, non-smoker and nondiabetic, with primarily orthopedic issues in her past medical history. She was referred to the clinic today by her primary care doctor to address a left lower leg wound. The wound apparently occurred in May while she was gardening. She is uncertain exactly how it happened but perhaps struck her leg on a stick or similar protruding structure. She completed a course of both clindamycin and Augmentin based upon a wound swab culture that grew methicillin sensitive Staph aureus. She has been applying some triamcinolone cream that she had from a previous wound care admission at the [facility] wound care center. 05/30/2023: The wound measured smaller today. It is also cleaner, with just a little bit of slough on the surface. No significant periwound erythema. 06/06/2023: The wound is smaller again today. There is minimal slough on the surface. There is some dry eschar around the wound edges. Edema control is excellent. 06/13/2023: The wound is just the slightest bit smaller today. The skin edges do appear to be starting to roll inward. Edema control is good. Patient History Information obtained from Patient. Family History Unknown History. Social History Former smoker - ended on 08/30/1975, Marital Status - Divorced, Alcohol Use - Rarely - wine, Drug Use -  No History, Caffeine Use - Daily - coffee1/1/19977. Medical History Eyes Patient has history of Glaucoma Cardiovascular Patient has history of Hypertension Hospitalization/Surgery History - Left Hip Replacement. - Bilateral feet Bunion and Hammer toe surgeries. - Bilateral Carpal tunnel repair. Medical A Surgical History Notes nd Cardiovascular Hx: TIA; Coronary artery disease with angina pectoris Endocrine Hx: Hypothyroidism Musculoskeletal Hx: Scoliosis; Left foot tendon rupture Oncologic Hx: Basal cell carcinoma ( Right cheek 25 years ago) Objective Constitutional no acute distress. Vitals Time Taken: 9:25 AM, Height: 60 in, Weight: 98 lbs, BMI: 19.1, Temperature: 98.1 F, Pulse: 65 bpm, Respiratory Rate: 18 breaths/min, Blood Pressure: 116/59 mmHg. Respiratory Normal work of breathing on room air. General Notes: 06/13/2023: The wound is just the slightest bit smaller today. The skin edges do appear to be starting to roll inward. Edema control is good. Integumentary (Hair, Skin) Wound #1 status is Open. Original cause of wound was  Trauma. The date acquired was: 12/28/2022. The wound has been in treatment 3 weeks. The wound is located on the Left Lower Leg. The wound measures 0.7cm length x 0.3cm width x 0.2cm depth; 0.165cm^2 area and 0.033cm^3 volume. There is Fat Layer (Subcutaneous Tissue) exposed. There is no tunneling or undermining noted. There is a medium amount of serosanguineous drainage noted. The wound margin is distinct with the outline attached to the wound base. There is large (67-100%) pink, pale granulation within the wound bed. There is a small (1-33%) amount of necrotic tissue within the wound bed including Eschar and Adherent Slough. The periwound skin appearance had no abnormalities noted for texture. The periwound skin appearance had no abnormalities noted for moisture. The periwound skin appearance had no abnormalities noted for color. Periwound temperature was  noted as No Abnormality. Assessment Active Problems ICD-10 Non-pressure chronic ulcer of other part of left lower leg with fat layer exposed Jessica [name], Jessica [name] ([id number]) [id number] Page 6 of 8 Procedures Wound #1 Pre-procedure diagnosis of Wound #1 is a Trauma, Other located on the Left Lower Leg . There was a Selective/Open Wound Skin/Epidermis Debridement with a total area of 0.16 sq cm performed by Duanne Guess, MD. With the following instrument(s): Curette to remove Viable and Non-Viable tissue/material. Material removed includes Slough, Skin: Dermis, and Skin: Epidermis after achieving pain control using Lidocaine 4% T opical Solution. No specimens were taken. A time out was conducted at 10:07, prior to the start of the procedure. A Minimum amount of bleeding was controlled with Pressure. The procedure was tolerated well with a pain level of 0 throughout and a pain level of 0 following the procedure. Post Debridement Measurements: 0.7cm length x 0.3cm width x 0.2cm depth; 0.033cm^3 volume. Character of Wound/Ulcer Post Debridement is improved. Post procedure Diagnosis Wound #1: Same as Pre-Procedure Plan Follow-up Appointments: Return Appointment in 1 week. - Dr. Lady Gary Room 3 06/20/23 at 8:30am Return Appointment in 2 weeks. - Please Ask front desk for appointments. Anesthetic: (In clinic) Topical Lidocaine 5% applied to wound bed Bathing/ Shower/ Hygiene: May shower and wash wound with soap and water. - Keep Left leg wrap dry. Use a cast protector to keep left leg dry. Cast Protector can be purchased from [employer], [place], Medical supply stores etc. Cost ranges between $17-$30 Edema Control - Lymphedema / SCD / Other: Elevate legs to the level of the heart or above for 30 minutes daily and/or when sitting for 3-4 times a day throughout the day. - When sitting please elevate legs. Avoid standing for long periods of time. Exercise regularly - As  tolerated Additional Orders / Instructions: Follow Nutritious Diet - Try and increase protein to 70g-100g per day. WOUND #1: - Lower Leg Wound Laterality: Left Cleanser: Soap and Water 1 x Per Week/30 Days Discharge Instructions: May shower and wash wound with dial antibacterial soap and water prior to dressing change. Cleanser: Vashe 5.8 (oz) 1 x Per Week/30 Days Discharge Instructions: Cleanse the wound with Vashe prior to applying a clean dressing using gauze sponges, not tissue or cotton balls. Peri-Wound Care: Sween Lotion (Moisturizing lotion) 1 x Per Week/30 Days Discharge Instructions: Apply moisturizing lotion as directed Topical: Gentamicin 1 x Per Week/30 Days Discharge Instructions: As directed by physician Topical: Mupirocin Ointment 1 x Per Week/30 Days Discharge Instructions: Apply Mupirocin (Bactroban) as instructed Prim Dressing: Endoform 2x2 in 1 x Per Week/30 Days ary Discharge Instructions: Moisten with saline Secondary Dressing: Woven Gauze Sponge, Non-Sterile 4x4 in 1 x Per  Week/30 Days Discharge Instructions: Apply over primary dressing as directed. Secured With: Coban Self-Adherent Wrap 4x5 (in/yd) 1 x Per Week/30 Days Discharge Instructions: Secure with Coban as directed. Secured With: [employer], 4.5x3.1 (in/yd) 1 x Per Week/30 Days Discharge Instructions: Secure with Kerlix as directed. Secured With: Transpore Surgical T ape, 2x10 (in/yd) 1 x Per Week/30 Days Discharge Instructions: Secure dressing with tape as directed. Secured With: Tubular Netting #5 1 x Per Week/30 Days 06/13/2023: The wound is just the slightest bit smaller today. The skin edges do appear to be starting to roll inward. Edema control is good. I used a curette to debride slough and skin from the wound, trying to remove the rolled edges to promote more epithelialization. Will continue the topical gentamicin and mupirocin. I would to change the contact layer from Prisma silver collagen  to endoform to see if this loosens along any quicker. Continue Kerlix and Coban wrap. Follow-up in 1 week. Electronic Signature(s) Signed: 06/13/2023 10:17:19 AM By: Duanne Guess MD FACS Entered By: Duanne Guess on 06/13/2023 10:17:19 Jessica [name] -------------------------------------------------------------------------------- [name] ([id number]) [id number] Page 7 of 8 Patient Name: Date of Service: Jessica [name] 06/13/2023 9:30 A M Medical Record Number: [id number] Patient Account Number: [id number] Date of Birth/Sex: Treating RN: [birth year] (82 y.o. F) Primary Care Provider: Ardean Larsen Other Clinician: Referring Provider: Treating Provider/Extender: Juventino Slovak, Rinka Weeks in Treatment: 3 Information Obtained From Patient Eyes Medical History: Positive for: Glaucoma Cardiovascular Medical History: Positive for: Hypertension Past Medical History Notes: Hx: TIA; Coronary artery disease with angina pectoris Endocrine Medical History: Past Medical History Notes: Hx: Hypothyroidism Musculoskeletal Medical History: Past Medical History Notes: Hx: Scoliosis; Left foot tendon rupture Oncologic Medical History: Past Medical History Notes: Hx: Basal cell carcinoma ( Right cheek 25 years ago) HBO Extended History Items Eyes: Glaucoma Immunizations Pneumococcal Vaccine: Received Pneumococcal Vaccination: No Implantable Devices None Hospitalization / Surgery History Type of Hospitalization/Surgery Left Hip Replacement Bilateral feet Bunion and Hammer toe surgeries Bilateral Carpal tunnel repair Family and Social History Unknown History: Yes; Former smoker - ended on 08/30/1975; Marital Status - Divorced; Alcohol Use: Rarely - wine; Drug Use: No History; Caffeine Use: Daily - coffee1/1/19977; Financial Concerns: No; Food, Clothing or Shelter Needs: No; Support System Lacking: No; Transportation  Concerns: No Electronic Signature(s) Signed: 06/13/2023 10:30:15 AM By: Duanne Guess MD FACS Entered By: Duanne Guess on 06/13/2023 10:15:12 SuperBill [name] -------------------------------------------------------------------------------- [name] ([id number]) [id number] Page 8 of 8 Patient Name: Date of Service: Jessica [name] 06/13/2023 Medical Record Number: [id number] Patient Account Number: [id number] Date of Birth/Sex: Treating RN: 1940-12-11 (82 y.o. F) Primary Care Provider: Ardean Larsen Other Clinician: Referring Provider: Treating Provider/Extender: Juventino Slovak, Rinka Weeks in Treatment: 3 Diagnosis Coding ICD-10 Codes Code Description [phone] Non-pressure chronic ulcer of other part of left lower leg with fat layer exposed Facility Procedures CPT4 Code Description Modifier Quantity [id number] - DEBRIDE WOUND 1ST 20 SQ CM OR < 1 ICD-10 Diagnosis Description L97.822 Non-pressure chronic ulcer of other part of left lower leg with fat layer exposed Physician Procedures Quantity CPT4 Code Description Modifier [id number] - WC PHYS LEVEL 4 - EST PT 25 1 ICD-10 Diagnosis Description L97.822 Non-pressure chronic ulcer of other part of left lower leg with fat layer exposed [id number] - WC PHYS DEBR WO ANESTH 20 SQ CM 1 ICD-10 Diagnosis Description L97.822 Non-pressure chronic ulcer of other part of left lower leg with fat  layer exposed Electronic Signature(s) Signed: 06/13/2023 10:17:36 AM By: Duanne Guess MD FACS Entered By: Duanne Guess on 06/13/2023 10:17:35

## 2023-06-20 ENCOUNTER — Encounter (HOSPITAL_BASED_OUTPATIENT_CLINIC_OR_DEPARTMENT_OTHER): Payer: Medicare PPO | Admitting: General Surgery

## 2023-06-20 DIAGNOSIS — I10 Essential (primary) hypertension: Secondary | ICD-10-CM | POA: Diagnosis not present

## 2023-06-20 DIAGNOSIS — S81802A Unspecified open wound, left lower leg, initial encounter: Secondary | ICD-10-CM | POA: Diagnosis not present

## 2023-06-20 DIAGNOSIS — L97822 Non-pressure chronic ulcer of other part of left lower leg with fat layer exposed: Secondary | ICD-10-CM | POA: Diagnosis not present

## 2023-06-20 DIAGNOSIS — E039 Hypothyroidism, unspecified: Secondary | ICD-10-CM | POA: Diagnosis not present

## 2023-06-20 DIAGNOSIS — I2510 Atherosclerotic heart disease of native coronary artery without angina pectoris: Secondary | ICD-10-CM | POA: Diagnosis not present

## 2023-06-20 NOTE — Progress Notes (Signed)
GENEAN, TAMARGO [name] ([id number]) [id number] Page 1 of 7 Visit Report for 06/20/2023 Arrival Information Details Patient Name: Date of Service: Jessica [name] 06/20/2023 8:30 A M Medical Record Number: [id number] Patient Account Number: [id number] Date of Birth/Sex: Treating RN: [birth year] (82 y.o. Jessica [name], Jessica [name] Primary Care Aileena Iglesia: Ardean Larsen Other Clinician: Referring Jeffie Spivack: Treating Tan Clopper/Extender: Jessica [name] in Treatment: 4 Visit Information History Since Last Visit Added or deleted any medications: No Patient Arrived: Cane Any new allergies or adverse reactions: No Arrival Time: 08:32 Had a fall or experienced change in No Accompanied By: self activities of daily living that may affect Transfer Assistance: None risk of falls: Patient Identification Verified: Yes Signs or symptoms of abuse/neglect since last visito No Secondary Verification Process Completed: Yes Hospitalized since last visit: No Patient Has Alerts: Yes Implantable device outside of the clinic excluding No Patient Alerts: Unable to complete ABI L cellular tissue based products placed in the center since last visit: Has Dressing in Place as Prescribed: Yes Has Compression in Place as Prescribed: Yes Pain Present Now: No Electronic Signature(s) Signed: 06/20/2023 3:45:49 PM By: Jessica [name] Entered By: Jessica [name] on 06/20/2023 05:32:27 -------------------------------------------------------------------------------- Encounter Discharge Information Details Patient Name: Date of Service: Jessica Sos [name]. 06/20/2023 8:30 A M Medical Record Number: [id number] Patient Account Number: [id number] Date of Birth/Sex: Treating RN: [birth year] (82 y.o. Jessica [name] Primary Care Renise Gillies: Ardean Larsen Other Clinician: Referring Esteen Delpriore: Treating Daylin Eads/Extender: Jessica [name]  in Treatment: 4 Encounter Discharge Information Items Post Procedure Vitals Discharge Condition: Stable Temperature (F): 97.7 Ambulatory Status: Cane Pulse (bpm): 70 Discharge Destination: Home Respiratory Rate (breaths/min): 18 Transportation: Private Auto Blood Pressure (mmHg): 143/74 Accompanied By: self Schedule Follow-up Appointment: Yes Clinical Summary of Care: Patient Declined Electronic Signature(s) Signed: 06/20/2023 3:45:49 PM By: Jessica [name] Entered By: Jessica [name] on 06/20/2023 05:58:37 [name] ([id number]) [id number] Page 2 of 7 -------------------------------------------------------------------------------- Lower Extremity Assessment Details Patient Name: Date of Service: Jessica [name] 06/20/2023 8:30 A M Medical Record Number: [id number] Patient Account Number: [id number] Date of Birth/Sex: Treating RN: 1940/11/09 (82 y.o. Jessica [name] Primary Care Jax Abdelrahman: Ardean Larsen Other Clinician: Referring Jaryd Drew: Treating Sophea Rackham/Extender: Jessica [name], Jessica [name] in Treatment: 4 Edema Assessment Assessed: [Left: No] [Right: No] Edema: [Left: N] [Right: o] Calf Left: Right: Point of Measurement: 30 cm From Medial Instep 29 cm Ankle Left: Right: Point of Measurement: 9 cm From Medial Instep 17.7 cm Vascular Assessment Pulses: Dorsalis Pedis Palpable: [Left:Yes] Extremity colors, hair growth, and conditions: Extremity Color: [Left:Normal] Temperature of Extremity: [Left:Warm] Capillary Refill: [Left:< 3 seconds] Dependent Rubor: [Left:No No] Electronic Signature(s) Signed: 06/20/2023 3:45:49 PM By: Jessica [name] Entered By: Jessica [name] on 06/20/2023 05:38:18 -------------------------------------------------------------------------------- Multi Wound Chart Details Patient Name: Date of Service: Jessica Sos [name]. 06/20/2023 8:30 A M Medical Record  Number: [id number] Patient Account Number: [id number] Date of Birth/Sex: Treating RN: 1941/01/29 (82 y.o. F) Primary Care Samvel Zinn: Ardean Larsen Other Clinician: Referring Jessica [name]: Treating Zanaria Morell/Extender: Jessica [name], Jessica [name] in Treatment: 4 Vital Signs Height(in): 60 Pulse(bpm): 70 Weight(lbs): 98 Blood Pressure(mmHg): 143/74 Body Mass Index(BMI): 19.1 Temperature(F): 97.7 Respiratory Rate(breaths/min): 18 [1:Photos:] [N/A:N/A] Left Lower Leg N/A N/A Wound Location: Trauma N/A N/A Wounding Event: Trauma, Other N/A N/A Primary Etiology: Glaucoma, Hypertension N/A N/A Comorbid History: 12/28/2022 N/A N/A Date Acquired: 4 N/A N/A [name] of Treatment: Open N/A N/A Wound Status: No  N/A N/A Wound Recurrence: 0.6x0.3x0.2 N/A N/A Measurements L x W x D (cm) 0.141 N/A N/A A (cm) : rea 0.028 N/A N/A Volume (cm) : 86.70% N/A N/A % Reduction in A rea: 73.60% N/A N/A % Reduction in Volume: Full Thickness Without Exposed N/A N/A Classification: Support Structures Medium N/A N/A Exudate A mount: Serosanguineous N/A N/A Exudate Type: red, brown N/A N/A Exudate Color: Distinct, outline attached N/A N/A Wound Margin: Large (67-100%) N/A N/A Granulation A mount: Pink, Pale N/A N/A Granulation Quality: Small (1-33%) N/A N/A Necrotic A mount: Fat Layer (Subcutaneous Tissue): Yes N/A N/A Exposed Structures: Fascia: No Tendon: No Muscle: No Joint: No Bone: No Small (1-33%) N/A N/A Epithelialization: Debridement - Selective/Open Wound N/A N/A Debridement: Pre-procedure Verification/Time Out 08:46 N/A N/A Taken: Lidocaine 4% Topical Solution N/A N/A Pain Control: Slough N/A N/A Tissue Debrided: Non-Viable Tissue N/A N/A Level: 0.14 N/A N/A Debridement A (sq cm): rea Curette N/A N/A Instrument: Minimum N/A N/A Bleeding: Pressure N/A N/A Hemostasis A chieved: Procedure was tolerated well N/A N/A Debridement Treatment  Response: 0.6x0.3x0.2 N/A N/A Post Debridement Measurements L x W x D (cm) 0.028 N/A N/A Post Debridement Volume: (cm) No Abnormalities Noted N/A N/A Periwound Skin Texture: Maceration: No N/A N/A Periwound Skin Moisture: Dry/Scaly: No No Abnormalities Noted N/A N/A Periwound Skin Color: No Abnormality N/A N/A Temperature: Debridement N/A N/A Procedures Performed: Treatment Notes Wound #1 (Lower Leg) Wound Laterality: Left Cleanser Soap and Water Discharge Instruction: May shower and wash wound with dial antibacterial soap and water prior to dressing change. Vashe 5.8 (oz) Discharge Instruction: Cleanse the wound with Vashe prior to applying a clean dressing using gauze sponges, not tissue or cotton balls. Peri-Wound Care Sween Lotion (Moisturizing lotion) Discharge Instruction: Apply moisturizing lotion as directed Topical Gentamicin Discharge Instruction: As directed by physician Mupirocin Ointment Discharge Instruction: Apply Mupirocin (Bactroban) as instructed Primary Dressing Endoform 2x2 in Discharge Instruction: Moisten with saline Jessica [name], Jessica [name] ([id number]) [id number] Page 4 of 7 Secondary Dressing Woven Gauze Sponge, Non-Sterile 4x4 in Discharge Instruction: Apply over primary dressing as directed. Secured With [employer] 4x5 (in/yd) Discharge Instruction: Secure with Coban as directed. Kerlix Roll Sterile, 4.5x3.1 (in/yd) Discharge Instruction: Secure with Kerlix as directed. Transpore Surgical Tape, 2x10 (in/yd) Discharge Instruction: Secure dressing with tape as directed. Tubular Netting #5 Compression Wrap Compression Stockings Add-Ons Electronic Signature(s) Signed: 06/20/2023 9:16:58 AM By: Jessica Guess MD FACS Entered By: Jessica [name] on 06/20/2023 06:16:58 -------------------------------------------------------------------------------- Multi-Disciplinary Care Plan Details Patient Name: Date  of Service: Jessica Sos [name]. 06/20/2023 8:30 A M Medical Record Number: [id number] Patient Account Number: [id number] Date of Birth/Sex: Treating RN: Nov 28, 1940 (82 y.o. Jessica [name] Primary Care Sanvi Ehler: Ardean Larsen Other Clinician: Referring Tarae Wooden: Treating Alyson Ki/Extender: Jessica [name], Jessica [name] in Treatment: 4 Active Inactive Wound/Skin Impairment Nursing Diagnoses: Impaired tissue integrity Goals: Patient/caregiver will verbalize understanding of skin care regimen Date Initiated: 05/23/2023 Target Resolution Date: 08/23/2023 Goal Status: Active Interventions: Assess ulceration(s) every visit Treatment Activities: Skin care regimen initiated : 05/23/2023 Notes: Electronic Signature(s) Signed: 06/20/2023 3:45:49 PM By: Jessica [name] Entered By: Jessica [name] on 06/20/2023 05:43:12 [name] ([id number]) [id number] Page 5 of 7 -------------------------------------------------------------------------------- Pain Assessment Details Patient Name: Date of Service: Jessica [name] 06/20/2023 8:30 A M Medical Record Number: [id number] Patient Account Number: [id number] Date of Birth/Sex: Treating RN: 07/12/1941 (82 y.o. Jessica [name] Primary Care Melisse Caetano: Ardean Larsen Other Clinician: Referring Eleri Ruben: Treating Azari Janssens/Extender: Jessica [name]  Pahwani, Jessica [name] in Treatment: 4 Active Problems Location of Pain Severity and Description of Pain Patient Has Paino No Site Locations Rate the pain. Current Pain Level: 0 Pain Management and Medication Current Pain Management: Electronic Signature(s) Signed: 06/20/2023 3:45:49 PM By: Jessica [name] Entered By: Jessica [name] on 06/20/2023 05:32:45 -------------------------------------------------------------------------------- Patient/Caregiver Education Details Patient Name: Date of Service: Jessica [name] 10/22/2024andnbsp8:30 A M Medical Record Number: [id number] Patient Account Number: [id number] Date of Birth/Gender: Treating RN: Oct 26, 1940 (82 y.o. Jessica [name] Primary Care Physician: Ardean Larsen Other Clinician: Referring Physician: Treating Physician/Extender: Jessica [name] in Treatment: 4 Education Assessment Education Provided To: Patient Education Topics Provided Wound/Skin Impairment: Methods: Explain/Verbal Responses: Reinforcements needed, State content correctly Electronic Signature(s) Signed: 06/20/2023 3:45:49 PM By: Jessica [name] Entered By: Jessica [name] on 06/20/2023 05:43:21 [name] ([id number]) [id number] Page 6 of 7 -------------------------------------------------------------------------------- Wound Assessment Details Patient Name: Date of Service: Jessica [name] 06/20/2023 8:30 A M Medical Record Number: [id number] Patient Account Number: [id number] Date of Birth/Sex: Treating RN: Jan 01, 1941 (82 y.o. Jessica [name] Primary Care Taysom Glymph: Ardean Larsen Other Clinician: Referring Santez Woodcox: Treating Jessica [name]/Extender: Jessica [name], Jessica [name] in Treatment: 4 Wound Status Wound Number: 1 Primary Etiology: Trauma, Other Wound Location: Left Lower Leg Wound Status: Open Wounding Event: Trauma Comorbid History: Glaucoma, Hypertension Date Acquired: 12/28/2022 [name] Of Treatment: 4 Clustered Wound: No Photos Wound Measurements Length: (cm) 0.6 Width: (cm) 0.3 Depth: (cm) 0.2 Area: (cm) 0.141 Volume: (cm) 0.028 % Reduction in Area: 86.7% % Reduction in Volume: 73.6% Epithelialization: Small (1-33%) Tunneling: No Undermining: No Wound Description Classification: Full Thickness Without Exposed Suppor Wound Margin: Distinct, outline attached Exudate Amount: Medium Exudate Type: Serosanguineous Exudate Color: red, brown t  Structures Foul Odor After Cleansing: No Slough/Fibrino Yes Wound Bed Granulation Amount: Large (67-100%) Exposed Structure Granulation Quality: Pink, Pale Fascia Exposed: No Necrotic Amount: Small (1-33%) Fat Layer (Subcutaneous Tissue) Exposed: Yes Necrotic Quality: Adherent Slough Tendon Exposed: No Muscle Exposed: No Joint Exposed: No Bone Exposed: No Periwound Skin Texture Texture Color No Abnormalities Noted: Yes No Abnormalities Noted: Yes Moisture Temperature / Pain No Abnormalities Noted: Yes Temperature: No Abnormality Treatment Notes Wound #1 (Lower Leg) Wound Laterality: Left Cleanser Jessica [name], Jessica [name] ([id number]) [id number] Page 7 of 7 Soap and Water Discharge Instruction: May shower and wash wound with dial antibacterial soap and water prior to dressing change. Vashe 5.8 (oz) Discharge Instruction: Cleanse the wound with Vashe prior to applying a clean dressing using gauze sponges, not tissue or cotton balls. Peri-Wound Care Sween Lotion (Moisturizing lotion) Discharge Instruction: Apply moisturizing lotion as directed Topical Gentamicin Discharge Instruction: As directed by physician Mupirocin Ointment Discharge Instruction: Apply Mupirocin (Bactroban) as instructed Primary Dressing Endoform 2x2 in Discharge Instruction: Moisten with saline Secondary Dressing Woven Gauze Sponge, Non-Sterile 4x4 in Discharge Instruction: Apply over primary dressing as directed. Secured With [employer] 4x5 (in/yd) Discharge Instruction: Secure with Coban as directed. Kerlix Roll Sterile, 4.5x3.1 (in/yd) Discharge Instruction: Secure with Kerlix as directed. Transpore Surgical Tape, 2x10 (in/yd) Discharge Instruction: Secure dressing with tape as directed. Tubular Netting #5 Compression Wrap Compression Stockings Add-Ons Electronic Signature(s) Signed: 06/20/2023 3:45:49 PM By: Jessica [name] Entered By: Jessica [name] on 06/20/2023 05:40:21 -------------------------------------------------------------------------------- Vitals Details Patient Name: Date of Service: Jessica Sos [name]. 06/20/2023 8:30 A M Medical Record Number: [id number] Patient Account Number: [id number] Date of Birth/Sex: Treating RN: [birth year] (82 y.o. Jessica [name]  Primary Care Miosotis Wetsel: Ardean Larsen Other Clinician: Referring Heidy Mccubbin: Treating Jessica [name]/Extender: Jessica [name], Jessica [name] in Treatment: 4 Vital Signs Time Taken: 08:32 Temperature (F): 97.7 Height (in): 60 Pulse (bpm): 70 Weight (lbs): 98 Respiratory Rate (breaths/min): 18 Body Mass Index (BMI): 19.1 Blood Pressure (mmHg): 143/74 Reference Range: 80 - 120 mg / dl Electronic Signature(s) Signed: 06/20/2023 3:45:49 PM By: Jessica [name] Entered By: Jessica [name] on 06/20/2023 05:32:38

## 2023-06-20 NOTE — Progress Notes (Signed)
MYKAL, MCATEE [name] ([id number]) [id number] Page 1 of 8 Visit Report for 06/20/2023 Chief Complaint Document Details Patient Name: Date of Service: Jessica [name] 06/20/2023 8:30 A M Medical Record Number: [id number] Patient Account Number: [id number] Date of Birth/Sex: Treating RN: Jan 17, 1941 (82 y.o. F) Primary Care Provider: Ardean Larsen Other Clinician: Referring Provider: Treating Provider/Extender: Juventino Slovak, Rinka Weeks in Treatment: 4 Information Obtained from: Patient Chief Complaint Patient seen for complaints of Non-Healing Wound. Electronic Signature(s) Signed: 06/20/2023 9:17:03 AM By: Duanne Guess MD FACS Entered By: Duanne Guess on 06/20/2023 06:17:03 -------------------------------------------------------------------------------- Debridement Details Patient Name: Date of Service: Jessica Sos [name]. 06/20/2023 8:30 A M Medical Record Number: [id number] Patient Account Number: [id number] Date of Birth/Sex: Treating RN: [birth year] (82 y.o. Jessica [name] Primary Care Provider: Ardean Larsen Other Clinician: Referring Provider: Treating Provider/Extender: Alfonse Ras in Treatment: 4 Debridement Performed for Assessment: Wound #1 Left Lower Leg Performed By: Physician Duanne Guess, MD The following information was scribed by: Samuella Bruin The information was scribed for: Duanne Guess Debridement Type: Debridement Level of Consciousness (Pre-procedure): Awake and Alert Pre-procedure Verification/Time Out Yes - 08:46 Taken: Start Time: 08:46 Pain Control: Lidocaine 4% T opical Solution Percent of Wound Bed Debrided: 100% T Area Debrided (cm): otal 0.14 Tissue and other material debrided: Non-Viable, Slough, Slough Level: Non-Viable Tissue Debridement Description: Selective/Open Wound Instrument: Curette Bleeding: Minimum Hemostasis Achieved:  Pressure Response to Treatment: Procedure was tolerated well Level of Consciousness (Post- Awake and Alert procedure): Post Debridement Measurements of Total Wound Length: (cm) 0.6 Width: (cm) 0.3 Depth: (cm) 0.2 Volume: (cm) 0.028 Character of Wound/Ulcer Post Debridement: Improved Post Procedure Diagnosis Jessica [name], Jessica [name] ([id number]) [id number] Page 2 of 8 Same as Pre-procedure Electronic Signature(s) Signed: 06/20/2023 9:39:59 AM By: Duanne Guess MD FACS Signed: 06/20/2023 3:45:49 PM By: Gelene Mink By: Samuella Bruin on 06/20/2023 05:48:48 -------------------------------------------------------------------------------- HPI Details Patient Name: Date of Service: Jessica Sos [name]. 06/20/2023 8:30 A M Medical Record Number: [id number] Patient Account Number: [id number] Date of Birth/Sex: Treating RN: [birth year] (82 y.o. F) Primary Care Provider: Ardean Larsen Other Clinician: Referring Provider: Treating Provider/Extender: Juventino Slovak, Rinka Weeks in Treatment: 4 History of Present Illness HPI Description: ADMISSION 05/23/2023 ***PATIENT UNABLE TO TOLERATE CUFFS FOR ABI*** This is a relatively healthy 82 year old woman, non-smoker and nondiabetic, with primarily orthopedic issues in her past medical history. She was referred to the clinic today by her primary care doctor to address a left lower leg wound. The wound apparently occurred in May while she was gardening. She is uncertain exactly how it happened but perhaps struck her leg on a stick or similar protruding structure. She completed a course of both clindamycin and Augmentin based upon a wound swab culture that grew methicillin sensitive Staph aureus. She has been applying some triamcinolone cream that she had from a previous wound care admission at the [facility] wound care center. 05/30/2023: The wound measured smaller today. It is also cleaner,  with just a little bit of slough on the surface. No significant periwound erythema. 06/06/2023: The wound is smaller again today. There is minimal slough on the surface. There is some dry eschar around the wound edges. Edema control is excellent. 06/13/2023: The wound is just the slightest bit smaller today. The skin edges do appear to be starting to roll inward. Edema control is good. 06/20/2023: Once again, the wound measured just the slightest bit smaller today. There is a  little bit of slough on the surface. Electronic Signature(s) Signed: 06/20/2023 9:17:32 AM By: Duanne Guess MD FACS Entered By: Duanne Guess on 06/20/2023 06:17:32 -------------------------------------------------------------------------------- Physical Exam Details Patient Name: Date of Service: Jessica [name] 06/20/2023 8:30 A M Medical Record Number: [id number] Patient Account Number: [id number] Date of Birth/Sex: Treating RN: 1941-01-21 (82 y.o. F) Primary Care Provider: Ardean Larsen Other Clinician: Referring Provider: Treating Provider/Extender: Juventino Slovak, Rinka Weeks in Treatment: 4 Constitutional Slightly hypertensive. . . . no acute distress. Respiratory Normal work of breathing on room air. Notes 06/20/2023: Once again, the wound measured just the slightest bit smaller today. There is a little bit of slough on the surface. Jessica [name], Jessica [name] ([id number]) [id number] Page 3 of 8 Electronic Signature(s) Signed: 06/20/2023 9:18:00 AM By: Duanne Guess MD FACS Entered By: Duanne Guess on 06/20/2023 06:18:00 -------------------------------------------------------------------------------- Physician Orders Details Patient Name: Date of Service: Jessica Sos [name]. 06/20/2023 8:30 A M Medical Record Number: [id number] Patient Account Number: [id number] Date of Birth/Sex: Treating RN: November 14, 1940 (82 y.o. Jessica [name] Primary  Care Provider: Ardean Larsen Other Clinician: Referring Provider: Treating Provider/Extender: Alfonse Ras in Treatment: 4 The following information was scribed by: Samuella Bruin The information was scribed for: Duanne Guess Verbal / Phone Orders: No Diagnosis Coding Follow-up Appointments ppointment in 1 week. - Dr. Lady Gary Room 3 Return A Anesthetic (In clinic) Topical Lidocaine 4% applied to wound bed Bathing/ Shower/ Hygiene May shower and wash wound with soap and water. - Keep Left leg wrap dry. Use a cast protector to keep left leg dry. Cast Protector can be purchased from [employer], [place], Medical supply stores etc. Cost ranges between $17-$30 Edema Control - Lymphedema / SCD / Other Left Lower Extremity Elevate legs to the level of the heart or above for 30 minutes daily and/or when sitting for 3-4 times a day throughout the day. - When sitting please elevate legs. Avoid standing for long periods of time. Exercise regularly - As tolerated Additional Orders / Instructions Follow Nutritious Diet - Try and increase protein to 70g-100g per day. Wound Treatment Wound #1 - Lower Leg Wound Laterality: Left Cleanser: Soap and Water 1 x Per Week/30 Days Discharge Instructions: May shower and wash wound with dial antibacterial soap and water prior to dressing change. Cleanser: Vashe 5.8 (oz) 1 x Per Week/30 Days Discharge Instructions: Cleanse the wound with Vashe prior to applying a clean dressing using gauze sponges, not tissue or cotton balls. Peri-Wound Care: Sween Lotion (Moisturizing lotion) 1 x Per Week/30 Days Discharge Instructions: Apply moisturizing lotion as directed Topical: Gentamicin 1 x Per Week/30 Days Discharge Instructions: As directed by physician Topical: Mupirocin Ointment 1 x Per Week/30 Days Discharge Instructions: Apply Mupirocin (Bactroban) as instructed Prim Dressing: Endoform 2x2 in 1 x Per Week/30 Days ary Discharge  Instructions: Moisten with saline Secondary Dressing: Woven Gauze Sponge, Non-Sterile 4x4 in 1 x Per Week/30 Days Discharge Instructions: Apply over primary dressing as directed. Secured With: Coban Self-Adherent Wrap 4x5 (in/yd) 1 x Per Week/30 Days Discharge Instructions: Secure with Coban as directed. Secured With: [employer], 4.5x3.1 (in/yd) 1 x Per Week/30 Days Discharge Instructions: Secure with Kerlix as directed. Secured With: Transpore Surgical Tape, 2x10 (in/yd) 1 x Per Week/30 Days Discharge Instructions: Secure dressing with tape as directed. Jessica [name], Jessica [name] ([id number]) [id number] Page 4 of 8 Secured With: Tubular Netting #5 1 x Per Week/30 Days Patient Medications llergies: Gluten Protein A Notifications Medication  Indication Start End 06/20/2023 lidocaine DOSE topical 4 % cream - cream topical Electronic Signature(s) Signed: 06/20/2023 9:39:59 AM By: Duanne Guess MD FACS Entered By: Duanne Guess on 06/20/2023 06:19:03 -------------------------------------------------------------------------------- Problem List Details Patient Name: Date of Service: Jessica Sos [name]. 06/20/2023 8:30 A M Medical Record Number: [id number] Patient Account Number: [id number] Date of Birth/Sex: Treating RN: 1941/04/25 (82 y.o. F) Primary Care Provider: Ardean Larsen Other Clinician: Referring Provider: Treating Provider/Extender: Juventino Slovak, Rinka Weeks in Treatment: 4 Active Problems ICD-10 Encounter Code Description Active Date MDM Diagnosis [phone] Non-pressure chronic ulcer of other part of left lower leg with fat layer exposed9/24/2024 No Yes Inactive Problems Resolved Problems Electronic Signature(s) Signed: 06/20/2023 9:16:51 AM By: Duanne Guess MD FACS Entered By: Duanne Guess on 06/20/2023 06:16:51 -------------------------------------------------------------------------------- Progress Note  Details Patient Name: Date of Service: Jessica Sos [name]. 06/20/2023 8:30 A M Medical Record Number: [id number] Patient Account Number: [id number] Date of Birth/Sex: Treating RN: 01/12/1941 (82 y.o. F) Primary Care Provider: Ardean Larsen Other Clinician: Referring Provider: Treating Provider/Extender: Juventino Slovak, Tacey Ruiz in Treatment: 4 Subjective Chief Complaint Jessica [name], Jessica [name] ([id number]) [id number] Page 5 of 8 Information obtained from Patient Patient seen for complaints of Non-Healing Wound. History of Present Illness (HPI) ADMISSION 05/23/2023 ***PATIENT UNABLE TO TOLERATE CUFFS FOR ABI*** This is a relatively healthy 82 year old woman, non-smoker and nondiabetic, with primarily orthopedic issues in her past medical history. She was referred to the clinic today by her primary care doctor to address a left lower leg wound. The wound apparently occurred in May while she was gardening. She is uncertain exactly how it happened but perhaps struck her leg on a stick or similar protruding structure. She completed a course of both clindamycin and Augmentin based upon a wound swab culture that grew methicillin sensitive Staph aureus. She has been applying some triamcinolone cream that she had from a previous wound care admission at the [facility] wound care center. 05/30/2023: The wound measured smaller today. It is also cleaner, with just a little bit of slough on the surface. No significant periwound erythema. 06/06/2023: The wound is smaller again today. There is minimal slough on the surface. There is some dry eschar around the wound edges. Edema control is excellent. 06/13/2023: The wound is just the slightest bit smaller today. The skin edges do appear to be starting to roll inward. Edema control is good. 06/20/2023: Once again, the wound measured just the slightest bit smaller today. There is a little bit of slough on the  surface. Patient History Information obtained from Patient. Family History Unknown History. Social History Former smoker - ended on 08/30/1975, Marital Status - Divorced, Alcohol Use - Rarely - wine, Drug Use - No History, Caffeine Use - Daily - coffee1/1/19977. Medical History Eyes Patient has history of Glaucoma Cardiovascular Patient has history of Hypertension Hospitalization/Surgery History - Left Hip Replacement. - Bilateral feet Bunion and Hammer toe surgeries. - Bilateral Carpal tunnel repair. Medical A Surgical History Notes nd Cardiovascular Hx: TIA; Coronary artery disease with angina pectoris Endocrine Hx: Hypothyroidism Musculoskeletal Hx: Scoliosis; Left foot tendon rupture Oncologic Hx: Basal cell carcinoma ( Right cheek 25 years ago) Objective Constitutional Slightly hypertensive. no acute distress. Vitals Time Taken: 8:32 AM, Height: 60 in, Weight: 98 lbs, BMI: 19.1, Temperature: 97.7 F, Pulse: 70 bpm, Respiratory Rate: 18 breaths/min, Blood Pressure: 143/74 mmHg. Respiratory Normal work of breathing on room air. General Notes: 06/20/2023: Once again, the wound measured just the slightest  bit smaller today. There is a little bit of slough on the surface. Integumentary (Hair, Skin) Wound #1 status is Open. Original cause of wound was Trauma. The date acquired was: 12/28/2022. The wound has been in treatment 4 weeks. The wound is located on the Left Lower Leg. The wound measures 0.6cm length x 0.3cm width x 0.2cm depth; 0.141cm^2 area and 0.028cm^3 volume. There is Fat Layer (Subcutaneous Tissue) exposed. There is no tunneling or undermining noted. There is a medium amount of serosanguineous drainage noted. The wound margin is distinct with the outline attached to the wound base. There is large (67-100%) pink, pale granulation within the wound bed. There is a small (1-33%) amount of necrotic tissue within the wound bed including Adherent Slough. The periwound skin  appearance had no abnormalities noted for texture. The periwound skin appearance had no abnormalities noted for moisture. The periwound skin appearance had no abnormalities noted for color. Periwound temperature was noted as No Abnormality. Assessment Jessica [name], Jessica [name] ([id number]) [id number] Page 6 of 8 Active Problems ICD-10 Non-pressure chronic ulcer of other part of left lower leg with fat layer exposed Procedures Wound #1 Pre-procedure diagnosis of Wound #1 is a Trauma, Other located on the Left Lower Leg . There was a Selective/Open Wound Non-Viable Tissue Debridement with a total area of 0.14 sq cm performed by Duanne Guess, MD. With the following instrument(s): Curette to remove Non-Viable tissue/material. Material removed includes [facility] after achieving pain control using Lidocaine 4% Topical Solution. No specimens were taken. A time out was conducted at 08:46, prior to the start of the procedure. A Minimum amount of bleeding was controlled with Pressure. The procedure was tolerated well. Post Debridement Measurements: 0.6cm length x 0.3cm width x 0.2cm depth; 0.028cm^3 volume. Character of Wound/Ulcer Post Debridement is improved. Post procedure Diagnosis Wound #1: Same as Pre-Procedure Plan Follow-up Appointments: Return Appointment in 1 week. - Dr. Lady Gary Room 3 Anesthetic: (In clinic) Topical Lidocaine 4% applied to wound bed Bathing/ Shower/ Hygiene: May shower and wash wound with soap and water. - Keep Left leg wrap dry. Use a cast protector to keep left leg dry. Cast Protector can be purchased from [employer], [place], Medical supply stores etc. Cost ranges between $17-$30 Edema Control - Lymphedema / SCD / Other: Elevate legs to the level of the heart or above for 30 minutes daily and/or when sitting for 3-4 times a day throughout the day. - When sitting please elevate legs. Avoid standing for long periods of time. Exercise regularly - As  tolerated Additional Orders / Instructions: Follow Nutritious Diet - Try and increase protein to 70g-100g per day. The following medication(s) was prescribed: lidocaine topical 4 % cream cream topical was prescribed at facility WOUND #1: - Lower Leg Wound Laterality: Left Cleanser: Soap and Water 1 x Per Week/30 Days Discharge Instructions: May shower and wash wound with dial antibacterial soap and water prior to dressing change. Cleanser: Vashe 5.8 (oz) 1 x Per Week/30 Days Discharge Instructions: Cleanse the wound with Vashe prior to applying a clean dressing using gauze sponges, not tissue or cotton balls. Peri-Wound Care: Sween Lotion (Moisturizing lotion) 1 x Per Week/30 Days Discharge Instructions: Apply moisturizing lotion as directed Topical: Gentamicin 1 x Per Week/30 Days Discharge Instructions: As directed by physician Topical: Mupirocin Ointment 1 x Per Week/30 Days Discharge Instructions: Apply Mupirocin (Bactroban) as instructed Prim Dressing: Endoform 2x2 in 1 x Per Week/30 Days ary Discharge Instructions: Moisten with saline Secondary Dressing: Woven Gauze Sponge, Non-Sterile 4x4 in 1  x Per Week/30 Days Discharge Instructions: Apply over primary dressing as directed. Secured With: Coban Self-Adherent Wrap 4x5 (in/yd) 1 x Per Week/30 Days Discharge Instructions: Secure with Coban as directed. Secured With: [employer], 4.5x3.1 (in/yd) 1 x Per Week/30 Days Discharge Instructions: Secure with Kerlix as directed. Secured With: Transpore Surgical T ape, 2x10 (in/yd) 1 x Per Week/30 Days Discharge Instructions: Secure dressing with tape as directed. Secured With: Tubular Netting #5 1 x Per Week/30 Days 06/20/2023: Once again, the wound measured just the slightest bit smaller today. There is a little bit of slough on the surface. I used a curette to debride the slough from her wound. We will continue topical gentamicin and mupirocin with endoform, Kerlix and Coban wrap.  Follow-up in 1 week. Electronic Signature(s) Signed: 06/20/2023 9:19:51 AM By: Duanne Guess MD FACS Entered By: Duanne Guess on 06/20/2023 06:19:51 Jessica [name] ([id number]) [id number] Page 7 of 8 -------------------------------------------------------------------------------- HxROS Details Patient Name: Date of Service: Jessica [name] 06/20/2023 8:30 A M Medical Record Number: [id number] Patient Account Number: [id number] Date of Birth/Sex: Treating RN: 03-05-1941 (82 y.o. F) Primary Care Provider: Ardean Larsen Other Clinician: Referring Provider: Treating Provider/Extender: Juventino Slovak, Rinka Weeks in Treatment: 4 Information Obtained From Patient Eyes Medical History: Positive for: Glaucoma Cardiovascular Medical History: Positive for: Hypertension Past Medical History Notes: Hx: TIA; Coronary artery disease with angina pectoris Endocrine Medical History: Past Medical History Notes: Hx: Hypothyroidism Musculoskeletal Medical History: Past Medical History Notes: Hx: Scoliosis; Left foot tendon rupture Oncologic Medical History: Past Medical History Notes: Hx: Basal cell carcinoma ( Right cheek 25 years ago) HBO Extended History Items Eyes: Glaucoma Immunizations Pneumococcal Vaccine: Received Pneumococcal Vaccination: No Implantable Devices None Hospitalization / Surgery History Type of Hospitalization/Surgery Left Hip Replacement Bilateral feet Bunion and Hammer toe surgeries Bilateral Carpal tunnel repair Family and Social History Unknown History: Yes; Former smoker - ended on 08/30/1975; Marital Status - Divorced; Alcohol Use: Rarely - wine; Drug Use: No History; Caffeine Use: Daily - coffee1/1/19977; Financial Concerns: No; Food, Clothing or Shelter Needs: No; Support System Lacking: No; Transportation Concerns: No Electronic Signature(s) Signed: 06/20/2023 9:39:59 AM By: Duanne Guess  MD FACS Entered By: Duanne Guess on 06/20/2023 06:17:37 Jessica [name] ([id number]) [id number] Page 8 of 8 -------------------------------------------------------------------------------- SuperBill Details Patient Name: Date of Service: Jessica [name] 06/20/2023 Medical Record Number: [id number] Patient Account Number: [id number] Date of Birth/Sex: Treating RN: 09-Aug-1941 (82 y.o. F) Primary Care Provider: Ardean Larsen Other Clinician: Referring Provider: Treating Provider/Extender: Juventino Slovak, Rinka Weeks in Treatment: 4 Diagnosis Coding ICD-10 Codes Code Description [phone] Non-pressure chronic ulcer of other part of left lower leg with fat layer exposed Facility Procedures : CPT4 Code: [id number] Description: 97597 - DEBRIDE WOUND 1ST 20 SQ CM OR < ICD-10 Diagnosis Description L97.822 Non-pressure chronic ulcer of other part of left lower leg with fat layer expose Modifier: d Quantity: 1 Physician Procedures : CPT4 Code Description Modifier [id number] 99214 - WC PHYS LEVEL 4 - EST PT 25 ICD-10 Diagnosis Description L97.822 Non-pressure chronic ulcer of other part of left lower leg with fat layer exposed Quantity: 1 : [id number] 97597 - WC PHYS DEBR WO ANESTH 20 SQ CM ICD-10 Diagnosis Description L97.822 Non-pressure chronic ulcer of other part of left lower leg with fat layer exposed Quantity: 1 Electronic Signature(s) Signed: 06/20/2023 9:20:05 AM By: Duanne Guess MD FACS Entered By: Duanne Guess on 06/20/2023 06:20:05

## 2023-06-27 ENCOUNTER — Encounter (HOSPITAL_BASED_OUTPATIENT_CLINIC_OR_DEPARTMENT_OTHER): Payer: Medicare PPO | Admitting: General Surgery

## 2023-06-27 DIAGNOSIS — I2510 Atherosclerotic heart disease of native coronary artery without angina pectoris: Secondary | ICD-10-CM | POA: Diagnosis not present

## 2023-06-27 DIAGNOSIS — E039 Hypothyroidism, unspecified: Secondary | ICD-10-CM | POA: Diagnosis not present

## 2023-06-27 DIAGNOSIS — S81802A Unspecified open wound, left lower leg, initial encounter: Secondary | ICD-10-CM | POA: Diagnosis not present

## 2023-06-27 DIAGNOSIS — I10 Essential (primary) hypertension: Secondary | ICD-10-CM | POA: Diagnosis not present

## 2023-06-27 DIAGNOSIS — L97822 Non-pressure chronic ulcer of other part of left lower leg with fat layer exposed: Secondary | ICD-10-CM | POA: Diagnosis not present

## 2023-06-28 NOTE — Progress Notes (Signed)
AIMAR, SIEMINSKI [name] ([id number]) [id number] Page 1 of 8 Visit Report for 06/27/2023 Chief Complaint Document Details Patient Name: Date of Service: Jessica [name] 06/27/2023 9:30 A M Medical Record Number: [id number] Patient Account Number: [id number] Date of Birth/Sex: Treating RN: 1941-05-26 (82 y.o. F) Primary Care Provider: Ardean Larsen Other Clinician: Referring Provider: Treating Provider/Extender: Juventino Slovak, Rinka Weeks in Treatment: 5 Information Obtained from: Patient Chief Complaint Patient seen for complaints of Non-Healing Wound. Electronic Signature(s) Signed: 06/27/2023 9:53:59 AM By: Duanne Guess MD FACS Entered By: Duanne Guess on 06/27/2023 09:53:58 -------------------------------------------------------------------------------- Debridement Details Patient Name: Date of Service: Jessica Sos [name]. 06/27/2023 9:30 A M Medical Record Number: [id number] Patient Account Number: [id number] Date of Birth/Sex: Treating RN: 08-28-1941 (82 y.o. Jessica [name] Primary Care Provider: Ardean Larsen Other Clinician: Referring Provider: Treating Provider/Extender: Alfonse Ras in Treatment: 5 Debridement Performed for Assessment: Wound #1 Left Lower Leg Performed By: Physician Duanne Guess, MD The following information was scribed by: Karie Schwalbe The information was scribed for: Duanne Guess Debridement Type: Debridement Level of Consciousness (Pre-procedure): Awake and Alert Pre-procedure Verification/Time Out Yes - 09:47 Taken: Start Time: 09:47 Pain Control: Lidocaine 4% T opical Solution Percent of Wound Bed Debrided: 100% T Area Debrided (cm): otal 0.14 Tissue and other material debrided: Non-Viable, Slough, Slough Level: Non-Viable Tissue Debridement Description: Selective/Open Wound Instrument: Curette Bleeding: Minimum Hemostasis Achieved:  Pressure Response to Treatment: Procedure was tolerated well Level of Consciousness (Post- Awake and Alert procedure): Post Debridement Measurements of Total Wound Length: (cm) 0.6 Width: (cm) 0.3 Depth: (cm) 0.2 Volume: (cm) 0.028 Character of Wound/Ulcer Post Debridement: Improved Post Procedure Diagnosis Jessica, CROASMUN [name] ([id number]) [phone].pdf Page 2 of 8 Same as Pre-procedure Electronic Signature(s) Signed: 06/27/2023 10:45:17 AM By: Duanne Guess MD FACS Signed: 06/27/2023 6:03:53 PM By: Karie Schwalbe RN Entered By: Karie Schwalbe on 06/27/2023 09:53:13 -------------------------------------------------------------------------------- HPI Details Patient Name: Date of Service: Jessica Sos [name]. 06/27/2023 9:30 A M Medical Record Number: [id number] Patient Account Number: [id number] Date of Birth/Sex: Treating RN: [birth year] (82 y.o. F) Primary Care Provider: Ardean Larsen Other Clinician: Referring Provider: Treating Provider/Extender: Juventino Slovak, Rinka Weeks in Treatment: 5 History of Present Illness HPI Description: ADMISSION 05/23/2023 ***PATIENT UNABLE TO TOLERATE CUFFS FOR ABI*** This is a relatively healthy 82 year old woman, non-smoker and nondiabetic, with primarily orthopedic issues in her past medical history. She was referred to the clinic today by her primary care doctor to address a left lower leg wound. The wound apparently occurred in May while she was gardening. She is uncertain exactly how it happened but perhaps struck her leg on a stick or similar protruding structure. She completed a course of both clindamycin and Augmentin based upon a wound swab culture that grew methicillin sensitive Staph aureus. She has been applying some triamcinolone cream that she had from a previous wound care admission at the [facility] wound care center. 05/30/2023: The wound measured smaller today. It is also cleaner,  with just a little bit of slough on the surface. No significant periwound erythema. 06/06/2023: The wound is smaller again today. There is minimal slough on the surface. There is some dry eschar around the wound edges. Edema control is excellent. 06/13/2023: The wound is just the slightest bit smaller today. The skin edges do appear to be starting to roll inward. Edema control is good. 06/20/2023: Once again, the wound measured just the slightest bit smaller today. There is  a little bit of slough on the surface. 06/27/2023: The wound measurements are essentially unchanged. The wound surface, however, does appear a bit more robust. Electronic Signature(s) Signed: 06/27/2023 9:54:25 AM By: Duanne Guess MD FACS Entered By: Duanne Guess on 06/27/2023 09:54:25 -------------------------------------------------------------------------------- Physical Exam Details Patient Name: Date of Service: Jessica Sos [name]. 06/27/2023 9:30 A M Medical Record Number: [id number] Patient Account Number: [id number] Date of Birth/Sex: Treating RN: [birth year] (82 y.o. F) Primary Care Provider: Ardean Larsen Other Clinician: Referring Provider: Treating Provider/Extender: Juventino Slovak, Rinka Weeks in Treatment: 5 Constitutional . Slightly bradycardic. . . no acute distress. Respiratory Normal work of breathing on room air.DEJANAI, SCHRIVER ([id number]) [id number] Page 3 of 8 06/27/2023: The wound measurements are essentially unchanged. The wound surface, however, does appear a bit more robust. Electronic Signature(s) Signed: 06/27/2023 9:54:54 AM By: Duanne Guess MD FACS Entered By: Duanne Guess on 06/27/2023 09:54:54 -------------------------------------------------------------------------------- Physician Orders Details Patient Name: Date of Service: Jessica Sos [name]. 06/27/2023 9:30 A M Medical Record Number:  [id number] Patient Account Number: [id number] Date of Birth/Sex: Treating RN: Jun 23, 1941 (82 y.o. Jessica [name] Primary Care Provider: Ardean Larsen Other Clinician: Referring Provider: Treating Provider/Extender: Alfonse Ras in Treatment: 5 Verbal / Phone Orders: No Diagnosis Coding Follow-up Appointments ppointment in 1 week. - Dr. Lady Gary Room 3 07/04/23 at 11am Return A Anesthetic (In clinic) Topical Lidocaine 4% applied to wound bed Bathing/ Shower/ Hygiene May shower and wash wound with soap and water. - Keep Left leg wrap dry. Use a cast protector to keep left leg dry. Cast Protector can be purchased from [employer], [employer], Medical supply stores etc. Cost ranges between $17-$30 Additional Orders / Instructions Follow Nutritious Diet - Try and increase protein to 70g-100g per day. Wound Treatment Wound #1 - Lower Leg Wound Laterality: Left Cleanser: Soap and Water 1 x Per Week/30 Days Discharge Instructions: May shower and wash wound with dial antibacterial soap and water prior to dressing change. Cleanser: Vashe 5.8 (oz) 1 x Per Week/30 Days Discharge Instructions: Cleanse the wound with Vashe prior to applying a clean dressing using gauze sponges, not tissue or cotton balls. Peri-Wound Care: Sween Lotion (Moisturizing lotion) 1 x Per Week/30 Days Discharge Instructions: Apply moisturizing lotion as directed Topical: Gentamicin 1 x Per Week/30 Days Discharge Instructions: As directed by physician Topical: Mupirocin Ointment 1 x Per Week/30 Days Discharge Instructions: Apply Mupirocin (Bactroban) as instructed Prim Dressing: Endoform 2x2 in 1 x Per Week/30 Days ary Discharge Instructions: Moisten with saline Secondary Dressing: Woven Gauze Sponge, Non-Sterile 4x4 in 1 x Per Week/30 Days Discharge Instructions: Apply over primary dressing as directed. Secured With: Coban Self-Adherent Wrap 4x5 (in/yd) 1 x Per Week/30 Days Discharge Instructions:  Secure with Coban as directed. Secured With: [employer], 4.5x3.1 (in/yd) 1 x Per Week/30 Days Discharge Instructions: Secure with Kerlix as directed. Secured With: Transpore Surgical Tape, 2x10 (in/yd) 1 x Per Week/30 Days Discharge Instructions: Secure dressing with tape as directed. Secured With: Tubular Netting #5 1 x Per Week/30 Days Electronic Signature(s) YESMIN, BISHOFF [name] ([id number]) [id number] Page 4 of 8 Signed: 06/27/2023 10:45:17 AM By: Duanne Guess MD FACS Entered By: Duanne Guess on 06/27/2023 09:55:20 -------------------------------------------------------------------------------- Problem List Details Patient Name: Date of Service: Jessica Sos [name]. 06/27/2023 9:30 A M Medical Record Number: [id number] Patient Account Number: [id number] Date of Birth/Sex: Treating RN: 04-16-1941 (82 y.o. F) Primary Care Provider: Ardean Larsen Other  Clinician: Referring Provider: Treating Provider/Extender: Juventino Slovak, Rinka Weeks in Treatment: 5 Active Problems ICD-10 Encounter Code Description Active Date MDM Diagnosis L97.822 Non-pressure chronic ulcer of other part of left lower leg with fat layer exposed9/24/2024 No Yes Inactive Problems Resolved Problems Electronic Signature(s) Signed: 06/27/2023 9:53:42 AM By: Duanne Guess MD FACS Entered By: Duanne Guess on 06/27/2023 09:53:42 -------------------------------------------------------------------------------- Progress Note Details Patient Name: Date of Service: Jessica Sos [name]. 06/27/2023 9:30 A M Medical Record Number: [id number] Patient Account Number: [id number] Date of Birth/Sex: Treating RN: February 17, 1941 (82 y.o. F) Primary Care Provider: Ardean Larsen Other Clinician: Referring Provider: Treating Provider/Extender: Juventino Slovak, Rinka Weeks in Treatment: 5 Subjective Chief Complaint Information obtained from  Patient Patient seen for complaints of Non-Healing Wound. History of Present Illness (HPI) ADMISSION 05/23/2023 ***PATIENT UNABLE TO TOLERATE CUFFS FOR ABI*** This is a relatively healthy 82 year old woman, non-smoker and nondiabetic, with primarily orthopedic issues in her past medical history. She was referred to the clinic today by her primary care doctor to address a left lower leg wound. The wound apparently occurred in May while she was gardening. She is uncertain exactly how it happened but perhaps struck her leg on a stick or similar protruding structure. She completed a course of both clindamycin and Augmentin based upon a wound swab culture that grew methicillin sensitive Staph aureus. She has been applying some triamcinolone cream that she had from a previous wound care admission at the [facility] wound care center. 05/30/2023: The wound measured smaller today. It is also cleaner, with just a little bit of slough on the surface. No significant periwound erythema. 06/06/2023: The wound is smaller again today. There is minimal slough on the surface. There is some dry eschar around the wound edges. Edema control is excellent. Jessica, [name] [name] ([id number]) [id number] Page 5 of 8 06/13/2023: The wound is just the slightest bit smaller today. The skin edges do appear to be starting to roll inward. Edema control is good. 06/20/2023: Once again, the wound measured just the slightest bit smaller today. There is a little bit of slough on the surface. 06/27/2023: The wound measurements are essentially unchanged. The wound surface, however, does appear a bit more robust. Patient History Information obtained from Patient. Family History Unknown History. Social History Former smoker - ended on 08/30/1975, Marital Status - Divorced, Alcohol Use - Rarely - wine, Drug Use - No History, Caffeine Use - Daily - coffee1/1/19977. Medical History Eyes Patient has history of  Glaucoma Cardiovascular Patient has history of Hypertension Hospitalization/Surgery History - Left Hip Replacement. - Bilateral feet Bunion and Hammer toe surgeries. - Bilateral Carpal tunnel repair. Medical A Surgical History Notes nd Cardiovascular Hx: TIA; Coronary artery disease with angina pectoris Endocrine Hx: Hypothyroidism Musculoskeletal Hx: Scoliosis; Left foot tendon rupture Oncologic Hx: Basal cell carcinoma ( Right cheek 25 years ago) Objective Constitutional Slightly bradycardic. no acute distress. Vitals Time Taken: 9:30 AM, Height: 60 in, Weight: 98 lbs, BMI: 19.1, Temperature: 97.6 F, Pulse: 56 bpm, Respiratory Rate: 18 breaths/min, Blood Pressure: 129/75 mmHg. Respiratory Normal work of breathing on room air.. General Notes: 06/27/2023: The wound measurements are essentially unchanged. The wound surface, however, does appear a bit more robust. Integumentary (Hair, Skin) Wound #1 status is Open. Original cause of wound was Trauma. The date acquired was: 12/28/2022. The wound has been in treatment 5 weeks. The wound is located on the Left Lower Leg. The wound measures 0.6cm length x 0.3cm width x 0.2cm depth; 0.141cm^2 area  and 0.028cm^3 volume. There is Fat Layer (Subcutaneous Tissue) exposed. There is no tunneling or undermining noted. There is a medium amount of serosanguineous drainage noted. The wound margin is distinct with the outline attached to the wound base. There is large (67-100%) pink, pale granulation within the wound bed. There is a small (1-33%) amount of necrotic tissue within the wound bed including Adherent Slough. The periwound skin appearance had no abnormalities noted for texture. The periwound skin appearance had no abnormalities noted for moisture. The periwound skin appearance had no abnormalities noted for color. Periwound temperature was noted as No Abnormality. Assessment Active Problems ICD-10 Non-pressure chronic ulcer of other part of  left lower leg with fat layer exposed Procedures Wound #1 Pre-procedure diagnosis of Wound #1 is a Trauma, Other located on the Left Lower Leg . There was a Selective/Open Wound Non-Viable Tissue Debridement with a total area of 0.14 sq cm performed by Duanne Guess, MD. With the following instrument(s): Curette to remove Non-Viable tissue/material. Material VASHAWN, RIGGEN [name] ([id number]) [id number] Page 6 of 8 removed includes Slough after achieving pain control using Lidocaine 4% Topical Solution. No specimens were taken. A time out was conducted at 09:47, prior to the start of the procedure. A Minimum amount of bleeding was controlled with Pressure. The procedure was tolerated well. Post Debridement Measurements: 0.6cm length x 0.3cm width x 0.2cm depth; 0.028cm^3 volume. Character of Wound/Ulcer Post Debridement is improved. Post procedure Diagnosis Wound #1: Same as Pre-Procedure Plan Follow-up Appointments: Return Appointment in 1 week. - Dr. Lady Gary Room 3 07/04/23 at 11am Anesthetic: (In clinic) Topical Lidocaine 4% applied to wound bed Bathing/ Shower/ Hygiene: May shower and wash wound with soap and water. - Keep Left leg wrap dry. Use a cast protector to keep left leg dry. Cast Protector can be purchased from [employer], [place], Medical supply stores etc. Cost ranges between $17-$30 Additional Orders / Instructions: Follow Nutritious Diet - Try and increase protein to 70g-100g per day. WOUND #1: - Lower Leg Wound Laterality: Left Cleanser: Soap and Water 1 x Per Week/30 Days Discharge Instructions: May shower and wash wound with dial antibacterial soap and water prior to dressing change. Cleanser: Vashe 5.8 (oz) 1 x Per Week/30 Days Discharge Instructions: Cleanse the wound with Vashe prior to applying a clean dressing using gauze sponges, not tissue or cotton balls. Peri-Wound Care: Sween Lotion (Moisturizing lotion) 1 x Per Week/30 Days Discharge  Instructions: Apply moisturizing lotion as directed Topical: Gentamicin 1 x Per Week/30 Days Discharge Instructions: As directed by physician Topical: Mupirocin Ointment 1 x Per Week/30 Days Discharge Instructions: Apply Mupirocin (Bactroban) as instructed Prim Dressing: Endoform 2x2 in 1 x Per Week/30 Days ary Discharge Instructions: Moisten with saline Secondary Dressing: Woven Gauze Sponge, Non-Sterile 4x4 in 1 x Per Week/30 Days Discharge Instructions: Apply over primary dressing as directed. Secured With: Coban Self-Adherent Wrap 4x5 (in/yd) 1 x Per Week/30 Days Discharge Instructions: Secure with Coban as directed. Secured With: [employer], 4.5x3.1 (in/yd) 1 x Per Week/30 Days Discharge Instructions: Secure with Kerlix as directed. Secured With: Transpore Surgical T ape, 2x10 (in/yd) 1 x Per Week/30 Days Discharge Instructions: Secure dressing with tape as directed. Secured With: Tubular Netting #5 1 x Per Week/30 Days 06/27/2023: The wound measurements are essentially unchanged. The wound surface, however, does appear a bit more robust. I used a curette to debride the slough from the wound surface. We will continue the mixture of topical gentamicin and mupirocin with endoform, Kerlix and Coban wrap.  Follow-up in 1 week. Electronic Signature(s) Signed: 06/27/2023 9:55:49 AM By: Duanne Guess MD FACS Entered By: Duanne Guess on 06/27/2023 09:55:49 -------------------------------------------------------------------------------- HxROS Details Patient Name: Date of Service: Jessica Sos [name]. 06/27/2023 9:30 A M Medical Record Number: [id number] Patient Account Number: [id number] Date of Birth/Sex: Treating RN: 1941-06-26 (82 y.o. F) Primary Care Provider: Ardean Larsen Other Clinician: Referring Provider: Treating Provider/Extender: Juventino Slovak, Rinka Weeks in Treatment: 5 Information Obtained From Patient Eyes Medical History: Positive  for: Glaucoma TYTIONNA, BESSANT [name] ([id number]) [id number] Page 7 of 8 Cardiovascular Medical History: Positive for: Hypertension Past Medical History Notes: Hx: TIA; Coronary artery disease with angina pectoris Endocrine Medical History: Past Medical History Notes: Hx: Hypothyroidism Musculoskeletal Medical History: Past Medical History Notes: Hx: Scoliosis; Left foot tendon rupture Oncologic Medical History: Past Medical History Notes: Hx: Basal cell carcinoma ( Right cheek 25 years ago) HBO Extended History Items Eyes: Glaucoma Immunizations Pneumococcal Vaccine: Received Pneumococcal Vaccination: No Implantable Devices None Hospitalization / Surgery History Type of Hospitalization/Surgery Left Hip Replacement Bilateral feet Bunion and Hammer toe surgeries Bilateral Carpal tunnel repair Family and Social History Unknown History: Yes; Former smoker - ended on 08/30/1975; Marital Status - Divorced; Alcohol Use: Rarely - wine; Drug Use: No History; Caffeine Use: Daily - coffee1/1/19977; Financial Concerns: No; Food, Clothing or Shelter Needs: No; Support System Lacking: No; Transportation Concerns: No Electronic Signature(s) Signed: 06/27/2023 10:45:17 AM By: Duanne Guess MD FACS Entered By: Duanne Guess on 06/27/2023 09:54:30 -------------------------------------------------------------------------------- SuperBill Details Patient Name: Date of Service: Jessica [name] 06/27/2023 Medical Record Number: [id number] Patient Account Number: [id number] Date of Birth/Sex: Treating RN: Apr 01, 1941 (82 y.o. F) Primary Care Provider: Ardean Larsen Other Clinician: Referring Provider: Treating Provider/Extender: Juventino Slovak, Rinka Weeks in Treatment: 5 Diagnosis Coding ICD-10 Codes Code Description [phone] Non-pressure chronic ulcer of other part of left lower leg with fat layer exposed BRITTIAN, Jessica [name]  ([id number]) [phone].pdf Page 8 of 8 Facility Procedures : CPT4 Code: [id number] Description: [phone] - DEBRIDE WOUND 1ST 20 SQ CM OR < ICD-10 Diagnosis Description L97.822 Non-pressure chronic ulcer of other part of left lower leg with fat layer expose Modifier: d Quantity: 1 Physician Procedures : CPT4 Code Description Modifier [id number] - WC PHYS LEVEL 4 - EST PT ICD-10 Diagnosis Description L97.822 Non-pressure chronic ulcer of other part of left lower leg with fat layer exposed Quantity: 1 : [id number] - WC PHYS DEBR WO ANESTH 20 SQ CM ICD-10 Diagnosis Description L97.822 Non-pressure chronic ulcer of other part of left lower leg with fat layer exposed Quantity: 1 Electronic Signature(s) Signed: 06/27/2023 9:57:28 AM By: Duanne Guess MD FACS Entered By: Duanne Guess on 06/27/2023 09:57:28

## 2023-06-28 NOTE — Progress Notes (Signed)
LAYLYNN, BRACKEN [name] ([id number]) [id number] Page 1 of 7 Visit Report for 06/27/2023 Arrival Information Details Patient Name: Date of Service: Jessica [name] 06/27/2023 9:30 A M Medical Record Number: [id number] Patient Account Number: [id number] Date of Birth/Sex: Treating RN: 23-Jun-1941 (82 y.o. F) Primary Care Mayci Haning: Ardean Larsen Other Clinician: Referring Ricardo Kayes: Treating Arcangel Minion/Extender: Juventino Slovak, Rinka Weeks in Treatment: 5 Visit Information History Since Last Visit Added or deleted any medications: No Patient Arrived: Ambulatory Any new allergies or adverse reactions: No Arrival Time: 09:30 Had a fall or experienced change in No Accompanied By: self activities of daily living that may affect Transfer Assistance: None risk of falls: Patient Identification Verified: Yes Signs or symptoms of abuse/neglect since last visito No Secondary Verification Process Completed: Yes Hospitalized since last visit: No Patient Has Alerts: Yes Implantable device outside of the clinic excluding No Patient Alerts: Unable to complete ABI L cellular tissue based products placed in the center since last visit: Pain Present Now: No Electronic Signature(s) Signed: 06/27/2023 10:03:20 AM By: Dayton Scrape Entered By: Dayton Scrape on 06/27/2023 09:30:37 -------------------------------------------------------------------------------- Encounter Discharge Information Details Patient Name: Date of Service: Jessica Sos [name]. 06/27/2023 9:30 A M Medical Record Number: [id number] Patient Account Number: [id number] Date of Birth/Sex: Treating RN: 1941-08-04 (82 y.o. [name] Primary Care Shawan Tosh: Ardean Larsen Other Clinician: Referring Makail Watling: Treating Makoto Sellitto/Extender: Alfonse Ras in Treatment: 5 Encounter Discharge Information Items Post Procedure Vitals Discharge Condition:  Stable Temperature (F): 97.6 Ambulatory Status: Ambulatory Pulse (bpm): 56 Discharge Destination: Home Respiratory Rate (breaths/min): 18 Transportation: Private Auto Blood Pressure (mmHg): 129/75 Accompanied By: self Schedule Follow-up Appointment: No Clinical Summary of Care: Patient Declined Electronic Signature(s) Signed: 06/27/2023 6:03:53 PM By: Karie Schwalbe RN Entered By: Karie Schwalbe on 06/27/2023 17:51:58 Jessica [name] ([id number]) [id number] Page 2 of 7 -------------------------------------------------------------------------------- Lower Extremity Assessment Details Patient Name: Date of Service: Jessica [name] 06/27/2023 9:30 A M Medical Record Number: [id number] Patient Account Number: [id number] Date of Birth/Sex: Treating RN: December 22, 1940 (82 y.o. [name] Primary Care Vernor Monnig: Ardean Larsen Other Clinician: Referring Myanna Ziesmer: Treating Dianelly Ferran/Extender: Juventino Slovak, Rinka Weeks in Treatment: 5 Edema Assessment Assessed: [Left: No] [Right: No] Edema: [Left: N] [Right: o] Calf Left: Right: Point of Measurement: 30 cm From Medial Instep 29 cm Ankle Left: Right: Point of Measurement: 9 cm From Medial Instep 17.6 cm Vascular Assessment Pulses: Dorsalis Pedis Palpable: [Left:Yes] Extremity colors, hair growth, and conditions: Extremity Color: [Left:Normal] Temperature of Extremity: [Left:Warm] Capillary Refill: [Left:< 3 seconds] Dependent Rubor: [Left:No No] Electronic Signature(s) Signed: 06/27/2023 6:03:53 PM By: Karie Schwalbe RN Entered By: Karie Schwalbe on 06/27/2023 09:40:44 -------------------------------------------------------------------------------- Multi Wound Chart Details Patient Name: Date of Service: Jessica Sos [name]. 06/27/2023 9:30 A M Medical Record Number: [id number] Patient Account Number: [id number] Date of Birth/Sex: Treating RN: 1941-01-31 (82 y.o.  F) Primary Care Velmer Broadfoot: Ardean Larsen Other Clinician: Referring Sheldon Amara: Treating Talea Manges/Extender: Juventino Slovak, Rinka Weeks in Treatment: 5 Vital Signs Height(in): 60 Pulse(bpm): 56 Weight(lbs): 98 Blood Pressure(mmHg): 129/75 Body Mass Index(BMI): 19.1 Temperature(F): 97.6 Respiratory Rate(breaths/min): 18 [1:Photos:] [N/A:N/A] Left Lower Leg N/A N/A Wound Location: Trauma N/A N/A Wounding Event: Trauma, Other N/A N/A Primary Etiology: Glaucoma, Hypertension N/A N/A Comorbid History: 12/28/2022 N/A N/A Date Acquired: 5 N/A N/A Weeks of Treatment: Open N/A N/A Wound Status: No N/A N/A Wound Recurrence: 0.6x0.3x0.2 N/A N/A Measurements L x W x D (cm)  0.141 N/A N/A A (cm) : rea 0.028 N/A N/A Volume (cm) : 86.70% N/A N/A % Reduction in A rea: 73.60% N/A N/A % Reduction in Volume: Full Thickness Without Exposed N/A N/A Classification: Support Structures Medium N/A N/A Exudate A mount: Serosanguineous N/A N/A Exudate Type: red, brown N/A N/A Exudate Color: Distinct, outline attached N/A N/A Wound Margin: Large (67-100%) N/A N/A Granulation A mount: Pink, Pale N/A N/A Granulation Quality: Small (1-33%) N/A N/A Necrotic A mount: Fat Layer (Subcutaneous Tissue): Yes N/A N/A Exposed Structures: Fascia: No Tendon: No Muscle: No Joint: No Bone: No Small (1-33%) N/A N/A Epithelialization: Debridement - Selective/Open Wound N/A N/A Debridement: Pre-procedure Verification/Time Out 09:47 N/A N/A Taken: Lidocaine 4% Topical Solution N/A N/A Pain Control: Slough N/A N/A Tissue Debrided: Non-Viable Tissue N/A N/A Level: 0.14 N/A N/A Debridement A (sq cm): rea Curette N/A N/A Instrument: Minimum N/A N/A Bleeding: Pressure N/A N/A Hemostasis A chieved: Procedure was tolerated well N/A N/A Debridement Treatment Response: 0.6x0.3x0.2 N/A N/A Post Debridement Measurements L x W x D (cm) 0.028 N/A N/A Post Debridement Volume:  (cm) No Abnormalities Noted N/A N/A Periwound Skin Texture: Maceration: No N/A N/A Periwound Skin Moisture: Dry/Scaly: No No Abnormalities Noted N/A N/A Periwound Skin Color: No Abnormality N/A N/A Temperature: Debridement N/A N/A Procedures Performed: Treatment Notes Electronic Signature(s) Signed: 06/27/2023 9:53:53 AM By: Duanne Guess MD FACS Entered By: Duanne Guess on 06/27/2023 09:53:53 -------------------------------------------------------------------------------- Multi-Disciplinary Care Plan Details Patient Name: Date of Service: Jessica Sos [name]. 06/27/2023 9:30 A M Medical Record Number: [id number] Patient Account Number: [id number] Date of Birth/Sex: Treating RN: [birth year] (82 y.o. [name] Primary Care Kobee Medlen: Ardean Larsen Other Clinician: Referring Maitri Schnoebelen: Treating Nathania Waldman/Extender: Juventino Slovak, Rinka Weeks in Treatment: 5 Shiri, [place] Tansi [name] ([id number]) [id number] Page 4 of 7 Active Inactive Wound/Skin Impairment Nursing Diagnoses: Impaired tissue integrity Goals: Patient/caregiver will verbalize understanding of skin care regimen Date Initiated: 05/23/2023 Target Resolution Date: 08/23/2023 Goal Status: Active Interventions: Assess ulceration(s) every visit Treatment Activities: Skin care regimen initiated : 05/23/2023 Notes: Electronic Signature(s) Signed: 06/27/2023 6:03:53 PM By: Karie Schwalbe RN Entered By: Karie Schwalbe on 06/27/2023 17:47:52 -------------------------------------------------------------------------------- Pain Assessment Details Patient Name: Date of Service: Jessica Sos [name]. 06/27/2023 9:30 A M Medical Record Number: [id number] Patient Account Number: [id number] Date of Birth/Sex: Treating RN: Sep 24, 1940 (82 y.o. F) Primary Care Carolie Mcilrath: Ardean Larsen Other Clinician: Referring Versa Craton: Treating Sherria Riemann/Extender: Juventino Slovak,  Rinka Weeks in Treatment: 5 Active Problems Location of Pain Severity and Description of Pain Patient Has Paino No Site Locations Pain Management and Medication Current Pain Management: Electronic Signature(s) Signed: 06/27/2023 10:03:20 AM By: Dayton Scrape Entered By: Dayton Scrape on 06/27/2023 09:31:06 Jessica [name] ([id number]) [id number] Page 5 of 7 -------------------------------------------------------------------------------- Patient/Caregiver Education Details Patient Name: Date of Service: Jessica [name] 10/29/2024andnbsp9:30 A M Medical Record Number: [id number] Patient Account Number: [id number] Date of Birth/Gender: Treating RN: 06/27/1941 (82 y.o. [name] Primary Care Physician: Ardean Larsen Other Clinician: Referring Physician: Treating Physician/Extender: Alfonse Ras in Treatment: 5 Education Assessment Education Provided To: Patient Education Topics Provided Wound/Skin Impairment: Methods: Explain/Verbal Responses: State content correctly Electronic Signature(s) Signed: 06/27/2023 6:03:53 PM By: Karie Schwalbe RN Entered By: Karie Schwalbe on 06/27/2023 17:48:06 -------------------------------------------------------------------------------- Wound Assessment Details Patient Name: Date of Service: Jessica [name] 06/27/2023 9:30 A M Medical Record Number: [id number] Patient Account Number: [id number] Date of Birth/Sex: Treating RN: 05/15/1941 (  82 y.o. F) Primary Care Anniemae Haberkorn: Ardean Larsen Other Clinician: Referring Mckennon Zwart: Treating Tyronne Blann/Extender: Juventino Slovak, Rinka Weeks in Treatment: 5 Wound Status Wound Number: 1 Primary Etiology: Trauma, Other Wound Location: Left Lower Leg Wound Status: Open Wounding Event: Trauma Comorbid History: Glaucoma, Hypertension Date Acquired: 12/28/2022 Weeks Of Treatment: 5 Clustered Wound: No Photos Wound  Measurements Length: (cm) 0.6 Width: (cm) 0.3 [place], Reigna [name] ([id number]) Depth: (cm) 0.2 Area: (cm) 0.141 Volume: (cm) 0.028 % Reduction in Area: 86.7% % Reduction in Volume: 73.6% [id number] Page 6 of 7 Epithelialization: Small (1-33%) Tunneling: No Undermining: No Wound Description Classification: Full Thickness Without Exposed Support Structures Wound Margin: Distinct, outline attached Exudate Amount: Medium Exudate Type: Serosanguineous Exudate Color: red, brown Foul Odor After Cleansing: No Slough/Fibrino Yes Wound Bed Granulation Amount: Large (67-100%) Exposed Structure Granulation Quality: Pink, Pale Fascia Exposed: No Necrotic Amount: Small (1-33%) Fat Layer (Subcutaneous Tissue) Exposed: Yes Necrotic Quality: Adherent Slough Tendon Exposed: No Muscle Exposed: No Joint Exposed: No Bone Exposed: No Periwound Skin Texture Texture Color No Abnormalities Noted: Yes No Abnormalities Noted: Yes Moisture Temperature / Pain No Abnormalities Noted: Yes Temperature: No Abnormality Treatment Notes Wound #1 (Lower Leg) Wound Laterality: Left Cleanser Soap and Water Discharge Instruction: May shower and wash wound with dial antibacterial soap and water prior to dressing change. Vashe 5.8 (oz) Discharge Instruction: Cleanse the wound with Vashe prior to applying a clean dressing using gauze sponges, not tissue or cotton balls. Peri-Wound Care Sween Lotion (Moisturizing lotion) Discharge Instruction: Apply moisturizing lotion as directed Topical Gentamicin Discharge Instruction: As directed by physician Mupirocin Ointment Discharge Instruction: Apply Mupirocin (Bactroban) as instructed Primary Dressing Endoform 2x2 in Discharge Instruction: Moisten with saline Secondary Dressing Woven Gauze Sponge, Non-Sterile 4x4 in Discharge Instruction: Apply over primary dressing as directed. Secured With [employer] 4x5  (in/yd) Discharge Instruction: Secure with Coban as directed. Kerlix Roll Sterile, 4.5x3.1 (in/yd) Discharge Instruction: Secure with Kerlix as directed. Transpore Surgical Tape, 2x10 (in/yd) Discharge Instruction: Secure dressing with tape as directed. Tubular Netting #5 Compression Wrap Compression Stockings Add-Ons Electronic Signature(s) Signed: 06/27/2023 6:03:53 PM By: Karie Schwalbe RN Entered By: Karie Schwalbe on 06/27/2023 09:40:58 Jessica [name] ([id number]) [id number] Page 7 of 7 -------------------------------------------------------------------------------- Vitals Details Patient Name: Date of Service: Jessica [name] 06/27/2023 9:30 A M Medical Record Number: [id number] Patient Account Number: [id number] Date of Birth/Sex: Treating RN: 1941/05/21 (82 y.o. F) Primary Care Rylon Poitra: Ardean Larsen Other Clinician: Referring Kyanne Rials: Treating Donia Yokum/Extender: Juventino Slovak, Rinka Weeks in Treatment: 5 Vital Signs Time Taken: 09:30 Temperature (F): 97.6 Height (in): 60 Pulse (bpm): 56 Weight (lbs): 98 Respiratory Rate (breaths/min): 18 Body Mass Index (BMI): 19.1 Blood Pressure (mmHg): 129/75 Reference Range: 80 - 120 mg / dl Electronic Signature(s) Signed: 06/27/2023 10:03:20 AM By: Dayton Scrape Entered By: Dayton Scrape on 06/27/2023 09:31:00

## 2023-07-04 ENCOUNTER — Encounter (HOSPITAL_BASED_OUTPATIENT_CLINIC_OR_DEPARTMENT_OTHER): Payer: Medicare PPO | Attending: General Surgery | Admitting: General Surgery

## 2023-07-04 DIAGNOSIS — I2510 Atherosclerotic heart disease of native coronary artery without angina pectoris: Secondary | ICD-10-CM | POA: Diagnosis not present

## 2023-07-04 DIAGNOSIS — E039 Hypothyroidism, unspecified: Secondary | ICD-10-CM | POA: Diagnosis not present

## 2023-07-04 DIAGNOSIS — I10 Essential (primary) hypertension: Secondary | ICD-10-CM | POA: Insufficient documentation

## 2023-07-04 DIAGNOSIS — S81802A Unspecified open wound, left lower leg, initial encounter: Secondary | ICD-10-CM | POA: Diagnosis not present

## 2023-07-04 DIAGNOSIS — L97822 Non-pressure chronic ulcer of other part of left lower leg with fat layer exposed: Secondary | ICD-10-CM | POA: Insufficient documentation

## 2023-07-04 NOTE — Progress Notes (Signed)
SHEILIA, REZNICK [name] ([id number]) [id number] Page 1 of 7 Visit Report for 07/04/2023 Arrival Information Details Patient Name: Date of Service: Jessica [name] 07/04/2023 11:00 A M Medical Record Number: [id number] Patient Account Number: [id number] Date of Birth/Sex: Treating RN: 1941-06-11 (82 y.o. F) Primary Care Katalea Ucci: Ardean Larsen Other Clinician: Referring Raysa Bosak: Treating Omah Dewalt/Extender: Juventino Slovak, Rinka Weeks in Treatment: 6 Visit Information History Since Last Visit Added or deleted any medications: No Patient Arrived: Cane Any new allergies or adverse reactions: No Arrival Time: 10:59 Signs or symptoms of abuse/neglect since last visito No Accompanied By: self Hospitalized since last visit: No Transfer Assistance: None Implantable device outside of the clinic excluding No Patient Identification Verified: Yes cellular tissue based products placed in the center Secondary Verification Process Completed: Yes since last visit: Patient Has Alerts: Yes Pain Present Now: No Patient Alerts: Unable to complete ABI L Electronic Signature(s) Signed: 07/04/2023 11:06:54 AM By: Dayton Scrape Entered By: Dayton Scrape on 07/04/2023 10:59:58 -------------------------------------------------------------------------------- Encounter Discharge Information Details Patient Name: Date of Service: Jessica Sos [name]. 07/04/2023 11:00 A M Medical Record Number: [id number] Patient Account Number: [id number] Date of Birth/Sex: Treating RN: 1940/08/30 (82 y.o. [name] Primary Care Clemma Johnsen: Ardean Larsen Other Clinician: Referring Ridhi Hoffert: Treating Maribelle Hopple/Extender: Alfonse Ras in Treatment: 6 Encounter Discharge Information Items Post Procedure Vitals Discharge Condition: Stable Temperature (F): 97.8 Ambulatory Status: Cane Pulse (bpm): 67 Discharge Destination: Home Respiratory Rate  (breaths/min): 18 Transportation: Private Auto Blood Pressure (mmHg): 118/74 Accompanied By: self Schedule Follow-up Appointment: Yes Clinical Summary of Care: Patient Declined Electronic Signature(s) Signed: 07/04/2023 4:24:28 PM By: Karie Schwalbe RN Entered By: Karie Schwalbe on 07/04/2023 16:08:40 Lower Extremity Assessment Details -------------------------------------------------------------------------------- [name] ([id number]) [id number] Page 2 of 7 Patient Name: Date of Service: Jessica [name] 07/04/2023 11:00 A M Medical Record Number: [id number] Patient Account Number: [id number] Date of Birth/Sex: Treating RN: 12-11-1940 (82 y.o. [name] Primary Care Qiara Minetti: Ardean Larsen Other Clinician: Referring Aragon Scarantino: Treating Tysean Vand[place]et/Extender: Juventino Slovak, Rinka Weeks in Treatment: 6 Edema Assessment Left: Right: Assessed: No No Edema: No Calf Left: Right: Point of Measurement: 30 cm From Medial Instep 30.1 cm Ankle Left: Right: Point of Measurement: 9 cm From Medial Instep 17.5 cm Vascular Assessment Left: Right: Pulses: Dorsalis Pedis Palpable: Yes Extremity colors, hair growth, and conditions: Extremity Color: Normal Temperature of Extremity: Warm Capillary Refill: < 3 seconds Dependent Rubor: No Lipodermatosclerosis: No Electronic Signature(s) Signed: 07/04/2023 4:24:28 PM By: Karie Schwalbe RN Entered By: Karie Schwalbe on 07/04/2023 11:11:17 -------------------------------------------------------------------------------- Multi Wound Chart Details Patient Name: Date of Service: Jessica Sos [name]. 07/04/2023 11:00 A M Medical Record Number: [id number] Patient Account Number: [id number] Date of Birth/Sex: Treating RN: 06-Jan-1941 (82 y.o. F) Primary Care Keshawn Sundberg: Ardean Larsen Other Clinician: Referring Brallan Denio: Treating Chloeann Alfred/Extender: Juventino Slovak,  Rinka Weeks in Treatment: 6 Vital Signs Height(in): 60 Pulse(bpm): 67 Weight(lbs): 98 Blood Pressure(mmHg): 118/74 Body Mass Index(BMI): 19.1 Temperature(F): 97.8 Respiratory Rate(breaths/min): 18 [1:Photos:] [N/A:N/A] Left Lower Leg N/A N/A Wound Location: Jessica [name], Jessica [name] ([id number]) [id number] Page 3 of 7 Trauma N/A N/A Wounding Event: Trauma, Other N/A N/A Primary Etiology: Glaucoma, Hypertension N/A N/A Comorbid History: 12/28/2022 N/A N/A Date Acquired: 6 N/A N/A Weeks of Treatment: Open N/A N/A Wound Status: No N/A N/A Wound Recurrence: 0.9x0.6x0.2 N/A N/A Measurements L x W x D (cm) 0.424 N/A N/A A (cm) : rea 0.085 N/A  N/A Volume (cm) : 60.00% N/A N/A % Reduction in A rea: 19.80% N/A N/A % Reduction in Volume: Full Thickness Without Exposed N/A N/A Classification: Support Structures Medium N/A N/A Exudate A mount: Serosanguineous N/A N/A Exudate Type: red, brown N/A N/A Exudate Color: Distinct, outline attached N/A N/A Wound Margin: Medium (34-66%) N/A N/A Granulation A mount: Pink, Pale N/A N/A Granulation Quality: Medium (34-66%) N/A N/A Necrotic A mount: Fat Layer (Subcutaneous Tissue): Yes N/A N/A Exposed Structures: Fascia: No Tendon: No Muscle: No Joint: No Bone: No Small (1-33%) N/A N/A Epithelialization: Debridement - Excisional N/A N/A Debridement: Pre-procedure Verification/Time Out 11:14 N/A N/A Taken: Lidocaine 4% Topical Solution N/A N/A Pain Control: Subcutaneous, Slough N/A N/A Tissue Debrided: Skin/Subcutaneous Tissue N/A N/A Level: 0.42 N/A N/A Debridement A (sq cm): rea Curette N/A N/A Instrument: Minimum N/A N/A Bleeding: Pressure N/A N/A Hemostasis A chieved: 0 N/A N/A Procedural Pain: 0 N/A N/A Post Procedural Pain: Procedure was tolerated well N/A N/A Debridement Treatment Response: 0.9x0.6x0.2 N/A N/A Post Debridement Measurements L x W x D (cm) 0.085 N/A N/A Post  Debridement Volume: (cm) No Abnormalities Noted N/A N/A Periwound Skin Texture: Maceration: No N/A N/A Periwound Skin Moisture: Dry/Scaly: No No Abnormalities Noted N/A N/A Periwound Skin Color: No Abnormality N/A N/A Temperature: Debridement N/A N/A Procedures Performed: Treatment Notes Electronic Signature(s) Signed: 07/04/2023 11:31:13 AM By: Duanne Guess MD FACS Entered By: Duanne Guess on 07/04/2023 11:31:13 -------------------------------------------------------------------------------- Multi-Disciplinary Care Plan Details Patient Name: Date of Service: Jessica Sos [name]. 07/04/2023 11:00 A M Medical Record Number: [id number] Patient Account Number: [id number] Date of Birth/Sex: Treating RN: 1941/08/24 (82 y.o. [name] Primary Care Shavelle Runkel: Ardean Larsen Other Clinician: Referring Neko Boyajian: Treating Fayette Gasner/Extender: Juventino Slovak, Rinka Weeks in Treatment: 6 Active Inactive Wound/Skin Impairment Jessica [name], Jessica [name] ([id number]) [id number] Page 4 of 7 Nursing Diagnoses: Impaired tissue integrity Goals: Patient/caregiver will verbalize understanding of skin care regimen Date Initiated: 05/23/2023 Target Resolution Date: 08/23/2023 Goal Status: Active Interventions: Assess ulceration(s) every visit Treatment Activities: Skin care regimen initiated : 05/23/2023 Notes: Electronic Signature(s) Signed: 07/04/2023 4:24:28 PM By: Karie Schwalbe RN Entered By: Karie Schwalbe on 07/04/2023 16:07:22 -------------------------------------------------------------------------------- Pain Assessment Details Patient Name: Date of Service: Jessica Sos [name]. 07/04/2023 11:00 A M Medical Record Number: [id number] Patient Account Number: [id number] Date of Birth/Sex: Treating RN: 16-Nov-1940 (82 y.o. F) Primary Care Jerris Fleer: Ardean Larsen Other Clinician: Referring Fitzgerald Dunne: Treating Shaana Acocella/Extender: Juventino Slovak, Rinka Weeks in Treatment: 6 Active Problems Location of Pain Severity and Description of Pain Patient Has Paino No Site Locations Pain Management and Medication Current Pain Management: Electronic Signature(s) Signed: 07/04/2023 11:06:54 AM By: Dayton Scrape Entered By: Dayton Scrape on 07/04/2023 11:00:21 Jessica [name] ([id number]) [id number] Page 5 of 7 -------------------------------------------------------------------------------- Patient/Caregiver Education Details Patient Name: Date of Service: Jessica [name] 11/5/2024andnbsp11:00 A M Medical Record Number: [id number] Patient Account Number: [id number] Date of Birth/Gender: Treating RN: 27-Nov-1940 (82 y.o. [name] Primary Care Physician: Ardean Larsen Other Clinician: Referring Physician: Treating Physician/Extender: Alfonse Ras in Treatment: 6 Education Assessment Education Provided To: Patient Education Topics Provided Wound/Skin Impairment: Methods: Explain/Verbal Responses: State content correctly Electronic Signature(s) Signed: 07/04/2023 4:24:28 PM By: Karie Schwalbe RN Entered By: Karie Schwalbe on 07/04/2023 16:07:36 -------------------------------------------------------------------------------- Wound Assessment Details Patient Name: Date of Service: Jessica Sos [name]. 07/04/2023 11:00 A M Medical Record Number: [id number] Patient Account Number: [id number] Date of Birth/Sex: Treating  RN: 1940-09-25 (82 y.o. F) Primary Care Isaias Dowson: Ardean Larsen Other Clinician: Referring Canda Podgorski: Treating Shamaine Mulkern/Extender: Juventino Slovak, Rinka Weeks in Treatment: 6 Wound Status Wound Number: 1 Primary Etiology: Trauma, Other Wound Location: Left Lower Leg Wound Status: Open Wounding Event: Trauma Comorbid History: Glaucoma, Hypertension Date Acquired: 12/28/2022 Weeks Of Treatment: 6 Clustered Wound:  No Photos Wound Measurements Length: (cm) 0.9 Width: (cm) 0.6 Jessica [name], Jessica [name] ([id number]) Depth: (cm) 0.2 Area: (cm) 0.424 Volume: (cm) 0.085 % Reduction in Area: 60% % Reduction in Volume: 19.8% [id number] Page 6 of 7 Epithelialization: Small (1-33%) Tunneling: No Undermining: No Wound Description Classification: Full Thickness Without Exposed Support Structures Wound Margin: Distinct, outline attached Exudate Amount: Medium Exudate Type: Serosanguineous Exudate Color: red, brown Foul Odor After Cleansing: No Slough/Fibrino Yes Wound Bed Granulation Amount: Medium (34-66%) Exposed Structure Granulation Quality: Pink, Pale Fascia Exposed: No Necrotic Amount: Medium (34-66%) Fat Layer (Subcutaneous Tissue) Exposed: Yes Necrotic Quality: Adherent Slough Tendon Exposed: No Muscle Exposed: No Joint Exposed: No Bone Exposed: No Periwound Skin Texture Texture Color No Abnormalities Noted: Yes No Abnormalities Noted: Yes Moisture Temperature / Pain No Abnormalities Noted: Yes Temperature: No Abnormality Treatment Notes Wound #1 (Lower Leg) Wound Laterality: Left Cleanser Soap and Water Discharge Instruction: May shower and wash wound with dial antibacterial soap and water prior to dressing change. Vashe 5.8 (oz) Discharge Instruction: Cleanse the wound with Vashe prior to applying a clean dressing using gauze sponges, not tissue or cotton balls. Peri-Wound Care Sween Lotion (Moisturizing lotion) Discharge Instruction: Apply moisturizing lotion as directed Topical Gentamicin Discharge Instruction: As directed by physician Mupirocin Ointment Discharge Instruction: Apply Mupirocin (Bactroban) as instructed Primary Dressing Endoform 2x2 in Discharge Instruction: Moisten with saline Optifoam Non-Adhesive Dressing, 4x4 in Discharge Instruction: Apply to foot as cushion Secondary Dressing Woven Gauze Sponge, Non-Sterile 4x4 in Discharge  Instruction: Apply over primary dressing as directed. Secured With [employer] 4x5 (in/yd) Discharge Instruction: Secure with Coban as directed. Kerlix Roll Sterile, 4.5x3.1 (in/yd) Discharge Instruction: Secure with Kerlix as directed. Transpore Surgical Tape, 2x10 (in/yd) Discharge Instruction: Secure dressing with tape as directed. Tubular Netting #5 Compression Wrap Compression Stockings Add-Ons Electronic Signature(s) Jessica [name], Jessica [name] ([id number]) [id number] Page 7 of 7 Signed: 07/04/2023 4:24:28 PM By: Karie Schwalbe RN Previous Signature: 07/04/2023 11:06:54 AM Version By: Dayton Scrape Entered By: Karie Schwalbe on 07/04/2023 11:23:29 -------------------------------------------------------------------------------- Vitals Details Patient Name: Date of Service: Jessica Sos [name]. 07/04/2023 11:00 A M Medical Record Number: [id number] Patient Account Number: [id number] Date of Birth/Sex: Treating RN: 18-May-1941 (82 y.o. F) Primary Care Jaren Vanetten: Ardean Larsen Other Clinician: Referring Sherod Cisse: Treating Javionna Leder/Extender: Juventino Slovak, Rinka Weeks in Treatment: 6 Vital Signs Time Taken: 11:00 Temperature (F): 97.8 Height (in): 60 Pulse (bpm): 67 Weight (lbs): 98 Respiratory Rate (breaths/min): 18 Body Mass Index (BMI): 19.1 Blood Pressure (mmHg): 118/74 Reference Range: 80 - 120 mg / dl Electronic Signature(s) Signed: 07/04/2023 11:06:54 AM By: Dayton Scrape Entered By: Dayton Scrape on 07/04/2023 11:00:15

## 2023-07-05 NOTE — Progress Notes (Signed)
Jessica [name] ([id number]) [id number] Page 1 of 8 Visit Report for 07/04/2023 Chief Complaint Document Details Patient Name: Date of Service: Jessica [name] 07/04/2023 11:00 A M Medical Record Number: [id number] Patient Account Number: [id number] Date of Birth/Sex: Treating RN: 1941/03/29 (82 y.o. F) Primary Care Provider: Ardean Larsen Other Clinician: Referring Provider: Treating Provider/Extender: Juventino Slovak, Rinka Weeks in Treatment: 6 Information Obtained from: Patient Chief Complaint Patient seen for complaints of Non-Healing Wound. Electronic Signature(s) Signed: 07/04/2023 11:31:24 AM By: Duanne Guess MD FACS Entered By: Duanne Guess on 07/04/2023 11:31:24 -------------------------------------------------------------------------------- Debridement Details Patient Name: Date of Service: Jessica Sos [name]. 07/04/2023 11:00 A M Medical Record Number: [id number] Patient Account Number: [id number] Date of Birth/Sex: Treating RN: 03/11/1941 (82 y.o. Jessica [name] Primary Care Provider: Ardean Larsen Other Clinician: Referring Provider: Treating Provider/Extender: Alfonse Ras in Treatment: 6 Debridement Performed for Assessment: Wound #1 Left Lower Leg Performed By: Physician Duanne Guess, MD The following information was scribed by: Karie Schwalbe The information was scribed for: Duanne Guess Debridement Type: Debridement Level of Consciousness (Pre-procedure): Awake and Alert Pre-procedure Verification/Time Out Yes - 11:14 Taken: Start Time: 11:14 Pain Control: Lidocaine 4% T opical Solution Percent of Wound Bed Debrided: 100% T Area Debrided (cm): otal 0.42 Tissue and other material debrided: Viable, Non-Viable, Slough, Subcutaneous, Skin: Dermis , Skin: Epidermis, Slough Level: Skin/Subcutaneous Tissue Debridement Description: Excisional Instrument:  Curette Bleeding: Minimum Hemostasis Achieved: Pressure End Time: 11:16 Procedural Pain: 0 Post Procedural Pain: 0 Response to Treatment: Procedure was tolerated well Level of Consciousness (Post- Awake and Alert procedure): Post Debridement Measurements of Total Wound Length: (cm) 0.9 Width: (cm) 0.6 Depth: (cm) 0.2 Volume: (cm) 0.085 Herren, Alexine [name] ([id number]) [phone].pdf Page 2 of 8 Character of Wound/Ulcer Post Debridement: Improved Post Procedure Diagnosis Same as Pre-procedure Electronic Signature(s) Signed: 07/04/2023 11:56:54 AM By: Duanne Guess MD FACS Signed: 07/04/2023 4:24:28 PM By: Karie Schwalbe RN Entered By: Karie Schwalbe on 07/04/2023 11:23:09 -------------------------------------------------------------------------------- HPI Details Patient Name: Date of Service: Jessica Sos [name]. 07/04/2023 11:00 A M Medical Record Number: [id number] Patient Account Number: [id number] Date of Birth/Sex: Treating RN: Sep 20, 1940 (82 y.o. F) Primary Care Provider: Ardean Larsen Other Clinician: Referring Provider: Treating Provider/Extender: Juventino Slovak, Rinka Weeks in Treatment: 6 History of Present Illness HPI Description: ADMISSION 05/23/2023 ***PATIENT UNABLE TO TOLERATE CUFFS FOR ABI*** This is a relatively healthy 82 year old woman, non-smoker and nondiabetic, with primarily orthopedic issues in her past medical history. She was referred to the clinic today by her primary care doctor to address a left lower leg wound. The wound apparently occurred in May while she was gardening. She is uncertain exactly how it happened but perhaps struck her leg on a stick or similar protruding structure. She completed a course of both clindamycin and Augmentin based upon a wound swab culture that grew methicillin sensitive Staph aureus. She has been applying some triamcinolone cream that she had from a previous wound care  admission at the [facility] wound care center. 05/30/2023: The wound measured smaller today. It is also cleaner, with just a little bit of slough on the surface. No significant periwound erythema. 06/06/2023: The wound is smaller again today. There is minimal slough on the surface. There is some dry eschar around the wound edges. Edema control is excellent. 06/13/2023: The wound is just the slightest bit smaller today. The skin edges do appear to be starting to roll inward. Edema control  is good. 06/20/2023: Once again, the wound measured just the slightest bit smaller today. There is a little bit of slough on the surface. 06/27/2023: The wound measurements are essentially unchanged. The wound surface, however, does appear a bit more robust. 07/04/2023: No significant change to the wound dimensions. The skin edges are rolling inward. There is slough on the surface. Edema control is good. Electronic Signature(s) Signed: 07/04/2023 11:31:55 AM By: Duanne Guess MD FACS Entered By: Duanne Guess on 07/04/2023 11:31:55 -------------------------------------------------------------------------------- Physical Exam Details Patient Name: Date of Service: Jessica Sos [name]. 07/04/2023 11:00 A M Medical Record Number: [id number] Patient Account Number: [id number] Date of Birth/Sex: Treating RN: 1941-08-12 (82 y.o. F) Primary Care Provider: Ardean Larsen Other Clinician: Referring Provider: Treating Provider/Extender: Juventino Slovak, Rinka Weeks in Treatment: 6 Constitutional . . . . no acute distress. KATHLEAN, CINCO [name] ([id number]) [id number] Page 3 of 8 Respiratory Normal work of breathing on room air.. Notes 07/04/2023: No significant change to the wound dimensions. The skin edges are rolling inward. There is slough on the surface. Edema control is good. Electronic Signature(s) Signed: 07/04/2023 11:34:37 AM By: Duanne Guess MD FACS Entered  By: Duanne Guess on 07/04/2023 11:34:37 -------------------------------------------------------------------------------- Physician Orders Details Patient Name: Date of Service: Jessica Sos [name]. 07/04/2023 11:00 A M Medical Record Number: [id number] Patient Account Number: [id number] Date of Birth/Sex: Treating RN: 07/01/1941 (82 y.o. Jessica [name] Primary Care Provider: Ardean Larsen Other Clinician: Referring Provider: Treating Provider/Extender: Alfonse Ras in Treatment: 6 Verbal / Phone Orders: No Diagnosis Coding ICD-10 Coding Code Description [id number] Non-pressure chronic ulcer of other part of left lower leg with fat layer exposed Follow-up Appointments ppointment in 1 week. - Dr. Lady Gary Room 3 07/11/23 at 9.30am Return A Anesthetic (In clinic) Topical Lidocaine 4% applied to wound bed Bathing/ Shower/ Hygiene May shower and wash wound with soap and water. - Keep Left leg wrap dry. Use a cast protector to keep left leg dry. Cast Protector can be purchased from [employer], [place], Medical supply stores etc. Cost ranges between $17-$30 Additional Orders / Instructions Follow Nutritious Diet - Try and increase protein to 70g-100g per day. Wound Treatment Wound #1 - Lower Leg Wound Laterality: Left Cleanser: Soap and Water 1 x Per Week/30 Days Discharge Instructions: May shower and wash wound with dial antibacterial soap and water prior to dressing change. Cleanser: Vashe 5.8 (oz) 1 x Per Week/30 Days Discharge Instructions: Cleanse the wound with Vashe prior to applying a clean dressing using gauze sponges, not tissue or cotton balls. Peri-Wound Care: Sween Lotion (Moisturizing lotion) 1 x Per Week/30 Days Discharge Instructions: Apply moisturizing lotion as directed Topical: Gentamicin 1 x Per Week/30 Days Discharge Instructions: As directed by physician Topical: Mupirocin Ointment 1 x Per Week/30 Days Discharge Instructions: Apply  Mupirocin (Bactroban) as instructed Prim Dressing: Endoform 2x2 in 1 x Per Week/30 Days ary Discharge Instructions: Moisten with saline Prim Dressing: Optifoam Non-Adhesive Dressing, 4x4 in 1 x Per Week/30 Days ary Discharge Instructions: Apply to foot as cushion Secondary Dressing: Woven Gauze Sponge, Non-Sterile 4x4 in 1 x Per Week/30 Days Discharge Instructions: Apply over primary dressing as directed. Secured With: Coban Self-Adherent Wrap 4x5 (in/yd) 1 x Per Week/30 Days Discharge Instructions: Secure with Coban as directed. ENRICA, CORLISS [name] ([id number]) [id number] Page 4 of 8 Secured With: [employer], 4.5x3.1 (in/yd) 1 x Per Week/30 Days Discharge Instructions: Secure with Kerlix as directed. Secured With: Transpore Surgical Tape, 2x10 (  in/yd) 1 x Per Week/30 Days Discharge Instructions: Secure dressing with tape as directed. Secured With: Tubular Netting #5 1 x Per Week/30 Days Electronic Signature(s) Signed: 07/04/2023 11:56:54 AM By: Duanne Guess MD FACS Entered By: Duanne Guess on 07/04/2023 11:34:51 -------------------------------------------------------------------------------- Problem List Details Patient Name: Date of Service: Jessica Sos [name]. 07/04/2023 11:00 A M Medical Record Number: [id number] Patient Account Number: [id number] Date of Birth/Sex: Treating RN: 10-09-1940 (82 y.o. F) Primary Care Provider: Ardean Larsen Other Clinician: Referring Provider: Treating Provider/Extender: Juventino Slovak, Rinka Weeks in Treatment: 6 Active Problems ICD-10 Encounter Code Description Active Date MDM Diagnosis [phone] Non-pressure chronic ulcer of other part of left lower leg with fat layer exposed9/24/2024 No Yes Inactive Problems Resolved Problems Electronic Signature(s) Signed: 07/04/2023 11:30:59 AM By: Duanne Guess MD FACS Entered By: Duanne Guess on 07/04/2023  11:30:59 -------------------------------------------------------------------------------- Progress Note Details Patient Name: Date of Service: Jessica Sos [name]. 07/04/2023 11:00 A M Medical Record Number: [id number] Patient Account Number: [id number] Date of Birth/Sex: Treating RN: 02-20-1941 (82 y.o. F) Primary Care Provider: Ardean Larsen Other Clinician: Referring Provider: Treating Provider/Extender: Juventino Slovak, Rinka Weeks in Treatment: 6 Subjective Chief Complaint Information obtained from Patient Patient seen for complaints of Non-Healing Wound. ZEINAB, RODWELL [name] ([id number]) [id number] Page 5 of 8 History of Present Illness (HPI) ADMISSION 05/23/2023 ***PATIENT UNABLE TO TOLERATE CUFFS FOR ABI*** This is a relatively healthy 81 year old woman, non-smoker and nondiabetic, with primarily orthopedic issues in her past medical history. She was referred to the clinic today by her primary care doctor to address a left lower leg wound. The wound apparently occurred in May while she was gardening. She is uncertain exactly how it happened but perhaps struck her leg on a stick or similar protruding structure. She completed a course of both clindamycin and Augmentin based upon a wound swab culture that grew methicillin sensitive Staph aureus. She has been applying some triamcinolone cream that she had from a previous wound care admission at the [facility] wound care center. 05/30/2023: The wound measured smaller today. It is also cleaner, with just a little bit of slough on the surface. No significant periwound erythema. 06/06/2023: The wound is smaller again today. There is minimal slough on the surface. There is some dry eschar around the wound edges. Edema control is excellent. 06/13/2023: The wound is just the slightest bit smaller today. The skin edges do appear to be starting to roll inward. Edema control is good. 06/20/2023: Once  again, the wound measured just the slightest bit smaller today. There is a little bit of slough on the surface. 06/27/2023: The wound measurements are essentially unchanged. The wound surface, however, does appear a bit more robust. 07/04/2023: No significant change to the wound dimensions. The skin edges are rolling inward. There is slough on the surface. Edema control is good. Patient History Information obtained from Patient. Family History Unknown History. Social History Former smoker - ended on 08/30/1975, Marital Status - Divorced, Alcohol Use - Rarely - wine, Drug Use - No History, Caffeine Use - Daily - coffee1/1/19977. Medical History Eyes Patient has history of Glaucoma Cardiovascular Patient has history of Hypertension Hospitalization/Surgery History - Left Hip Replacement. - Bilateral feet Bunion and Hammer toe surgeries. - Bilateral Carpal tunnel repair. Medical A Surgical History Notes nd Cardiovascular Hx: TIA; Coronary artery disease with angina pectoris Endocrine Hx: Hypothyroidism Musculoskeletal Hx: Scoliosis; Left foot tendon rupture Oncologic Hx: Basal cell carcinoma ( Right cheek 25 years ago) Objective  Constitutional no acute distress. Vitals Time Taken: 11:00 AM, Height: 60 in, Weight: 98 lbs, BMI: 19.1, Temperature: 97.8 F, Pulse: 67 bpm, Respiratory Rate: 18 breaths/min, Blood Pressure: 118/74 mmHg. Respiratory Normal work of breathing on room air.. General Notes: 07/04/2023: No significant change to the wound dimensions. The skin edges are rolling inward. There is slough on the surface. Edema control is good. Integumentary (Hair, Skin) Wound #1 status is Open. Original cause of wound was Trauma. The date acquired was: 12/28/2022. The wound has been in treatment 6 weeks. The wound is located on the Left Lower Leg. The wound measures 0.9cm length x 0.6cm width x 0.2cm depth; 0.424cm^2 area and 0.085cm^3 volume. There is Fat Layer (Subcutaneous Tissue) exposed.  There is no tunneling or undermining noted. There is a medium amount of serosanguineous drainage noted. The wound margin is distinct with the outline attached to the wound base. There is medium (34-66%) pink, pale granulation within the wound bed. There is a medium (34-66%) amount of necrotic tissue within the wound bed including Adherent Slough. The periwound skin appearance had no abnormalities noted for texture. The periwound skin appearance had no abnormalities noted for moisture. The periwound skin appearance had no abnormalities noted for color. Periwound temperature was noted as No Abnormality. Assessment MOZELL, HABER [name] ([id number]) [id number] Page 6 of 8 Active Problems ICD-10 Non-pressure chronic ulcer of other part of left lower leg with fat layer exposed Procedures Wound #1 Pre-procedure diagnosis of Wound #1 is a Trauma, Other located on the Left Lower Leg . There was a Excisional Skin/Subcutaneous Tissue Debridement with a total area of 0.42 sq cm performed by Duanne Guess, MD. With the following instrument(s): Curette to remove Viable and Non-Viable tissue/material. Material removed includes Subcutaneous Tissue, Slough, Skin: Dermis, and Skin: Epidermis after achieving pain control using Lidocaine 4% T opical Solution. No specimens were taken. A time out was conducted at 11:14, prior to the start of the procedure. A Minimum amount of bleeding was controlled with Pressure. The procedure was tolerated well with a pain level of 0 throughout and a pain level of 0 following the procedure. Post Debridement Measurements: 0.9cm length x 0.6cm width x 0.2cm depth; 0.085cm^3 volume. Character of Wound/Ulcer Post Debridement is improved. Post procedure Diagnosis Wound #1: Same as Pre-Procedure Plan Follow-up Appointments: Return Appointment in 1 week. - Dr. Lady Gary Room 3 07/11/23 at 9.30am Anesthetic: (In clinic) Topical Lidocaine 4% applied to wound  bed Bathing/ Shower/ Hygiene: May shower and wash wound with soap and water. - Keep Left leg wrap dry. Use a cast protector to keep left leg dry. Cast Protector can be purchased from [employer], [place], Medical supply stores etc. Cost ranges between $17-$30 Additional Orders / Instructions: Follow Nutritious Diet - Try and increase protein to 70g-100g per day. WOUND #1: - Lower Leg Wound Laterality: Left Cleanser: Soap and Water 1 x Per Week/30 Days Discharge Instructions: May shower and wash wound with dial antibacterial soap and water prior to dressing change. Cleanser: Vashe 5.8 (oz) 1 x Per Week/30 Days Discharge Instructions: Cleanse the wound with Vashe prior to applying a clean dressing using gauze sponges, not tissue or cotton balls. Peri-Wound Care: Sween Lotion (Moisturizing lotion) 1 x Per Week/30 Days Discharge Instructions: Apply moisturizing lotion as directed Topical: Gentamicin 1 x Per Week/30 Days Discharge Instructions: As directed by physician Topical: Mupirocin Ointment 1 x Per Week/30 Days Discharge Instructions: Apply Mupirocin (Bactroban) as instructed Prim Dressing: Endoform 2x2 in 1 x Per Week/30 Days ary Discharge  Instructions: Moisten with saline Prim Dressing: Optifoam Non-Adhesive Dressing, 4x4 in 1 x Per Week/30 Days ary Discharge Instructions: Apply to foot as cushion Secondary Dressing: Woven Gauze Sponge, Non-Sterile 4x4 in 1 x Per Week/30 Days Discharge Instructions: Apply over primary dressing as directed. Secured With: Coban Self-Adherent Wrap 4x5 (in/yd) 1 x Per Week/30 Days Discharge Instructions: Secure with Coban as directed. Secured With: [employer], 4.5x3.1 (in/yd) 1 x Per Week/30 Days Discharge Instructions: Secure with Kerlix as directed. Secured With: Transpore Surgical T ape, 2x10 (in/yd) 1 x Per Week/30 Days Discharge Instructions: Secure dressing with tape as directed. Secured With: Tubular Netting #5 1 x Per Week/30  Days 07/04/2023: No significant change to the wound dimensions. The skin edges are rolling inward. There is slough on the surface. Edema control is good. I used a curette to debride slough and subcutaneous tissue from the wound. I also was able to remove some of the wound in skin edge, also this was limited secondary to patient discomfort. We will continue with topical gentamicin and mupirocin along with endoform and Kerlix Coban compression. Follow-up in 1 week. Electronic Signature(s) Signed: 07/04/2023 11:35:42 AM By: Duanne Guess MD FACS Entered By: Duanne Guess on 07/04/2023 11:35:42 [name] ([id number]) [id number] Page 7 of 8 -------------------------------------------------------------------------------- HxROS Details Patient Name: Date of Service: Jessica Sos [name]. 07/04/2023 11:00 A M Medical Record Number: [id number] Patient Account Number: [id number] Date of Birth/Sex: Treating RN: Jan 10, 1941 (82 y.o. F) Primary Care Provider: Ardean Larsen Other Clinician: Referring Provider: Treating Provider/Extender: Juventino Slovak, Rinka Weeks in Treatment: 6 Information Obtained From Patient Eyes Medical History: Positive for: Glaucoma Cardiovascular Medical History: Positive for: Hypertension Past Medical History Notes: Hx: TIA; Coronary artery disease with angina pectoris Endocrine Medical History: Past Medical History Notes: Hx: Hypothyroidism Musculoskeletal Medical History: Past Medical History Notes: Hx: Scoliosis; Left foot tendon rupture Oncologic Medical History: Past Medical History Notes: Hx: Basal cell carcinoma ( Right cheek 25 years ago) HBO Extended History Items Eyes: Glaucoma Immunizations Pneumococcal Vaccine: Received Pneumococcal Vaccination: No Implantable Devices None Hospitalization / Surgery History Type of Hospitalization/Surgery Left Hip Replacement Bilateral feet Bunion and  Hammer toe surgeries Bilateral Carpal tunnel repair Family and Social History Unknown History: Yes; Former smoker - ended on 08/30/1975; Marital Status - Divorced; Alcohol Use: Rarely - wine; Drug Use: No History; Caffeine Use: Daily - coffee1/1/19977; Financial Concerns: No; Food, Clothing or Shelter Needs: No; Support System Lacking: No; Transportation Concerns: No Electronic Signature(s) Signed: 07/04/2023 11:56:54 AM By: Duanne Guess MD FACS Entered By: Duanne Guess on 07/04/2023 11:34:13 [name] ([id number]) [id number] Page 8 of 8 -------------------------------------------------------------------------------- SuperBill Details Patient Name: Date of Service: Jessica [name] 07/04/2023 Medical Record Number: [id number] Patient Account Number: [id number] Date of Birth/Sex: Treating RN: [birth year] (82 y.o. F) Primary Care Provider: Ardean Larsen Other Clinician: Referring Provider: Treating Provider/Extender: Juventino Slovak, Rinka Weeks in Treatment: 6 Diagnosis Coding ICD-10 Codes Code Description [id number] Non-pressure chronic ulcer of other part of left lower leg with fat layer exposed Facility Procedures : CPT4 Code: [id number] Description: 11042 - DEB SUBQ TISSUE 20 SQ CM/< ICD-10 Diagnosis Description L97.822 Non-pressure chronic ulcer of other part of left lower leg with fat layer expo Modifier: sed Quantity: 1 Physician Procedures : CPT4 Code Description Modifier [id number] 99214 - WC PHYS LEVEL 4 - EST PT ICD-10 Diagnosis Description L97.822 Non-pressure chronic ulcer of other part of left lower leg with fat layer exposed Quantity: 1 :  [id number] 11042 - WC PHYS SUBQ TISS 20 SQ CM ICD-10 Diagnosis Description L97.822 Non-pressure chronic ulcer of other part of left lower leg with fat layer exposed Quantity: 1 Electronic Signature(s) Signed: 07/04/2023 11:35:57 AM By: Duanne Guess MD FACS Entered By: Duanne Guess on 07/04/2023 11:35:57

## 2023-07-11 ENCOUNTER — Encounter (HOSPITAL_BASED_OUTPATIENT_CLINIC_OR_DEPARTMENT_OTHER): Payer: Medicare PPO | Admitting: General Surgery

## 2023-07-11 DIAGNOSIS — I10 Essential (primary) hypertension: Secondary | ICD-10-CM | POA: Diagnosis not present

## 2023-07-11 DIAGNOSIS — I2510 Atherosclerotic heart disease of native coronary artery without angina pectoris: Secondary | ICD-10-CM | POA: Diagnosis not present

## 2023-07-11 DIAGNOSIS — S81802A Unspecified open wound, left lower leg, initial encounter: Secondary | ICD-10-CM | POA: Diagnosis not present

## 2023-07-11 DIAGNOSIS — E039 Hypothyroidism, unspecified: Secondary | ICD-10-CM | POA: Diagnosis not present

## 2023-07-11 DIAGNOSIS — L97822 Non-pressure chronic ulcer of other part of left lower leg with fat layer exposed: Secondary | ICD-10-CM | POA: Diagnosis not present

## 2023-07-11 NOTE — Progress Notes (Signed)
EVANGELYNE, LACEWELL [name] ([id number]) [id number] Page 1 of 8 Visit Report for 07/11/2023 Chief Complaint Document Details Patient Name: Date of Service: Jessica [name] 07/11/2023 9:30 A M Medical Record Number: [id number] Patient Account Number: [id number] Date of Birth/Sex: Treating RN: 07/18/1941 (82 y.o. F) Primary Care Provider: Ardean Larsen Other Clinician: Referring Provider: Treating Provider/Extender: Juventino Slovak, Rinka Weeks in Treatment: 7 Information Obtained from: Patient Chief Complaint Patient seen for complaints of Non-Healing Wound. Electronic Signature(s) Signed: 07/11/2023 10:09:33 AM By: Duanne Guess MD FACS Entered By: Duanne Guess on 07/11/2023 07:09:33 -------------------------------------------------------------------------------- Debridement Details Patient Name: Date of Service: Jessica Sos [name]. 07/11/2023 9:30 A M Medical Record Number: [id number] Patient Account Number: [id number] Date of Birth/Sex: Treating RN: [birth year] (82 y.o. Jessica [name] Primary Care Provider: Ardean Larsen Other Clinician: Referring Provider: Treating Provider/Extender: Alfonse Ras in Treatment: 7 Debridement Performed for Assessment: Wound #1 Left Lower Leg Performed By: Physician Duanne Guess, MD The following information was scribed by: Karie Schwalbe The information was scribed for: Duanne Guess Debridement Type: Debridement Level of Consciousness (Pre-procedure): Awake and Alert Pre-procedure Verification/Time Out Yes - 09:55 Taken: Start Time: 09:55 Pain Control: Lidocaine 4% T opical Solution Percent of Wound Bed Debrided: 100% T Area Debrided (cm): otal 0.16 Tissue and other material debrided: Viable, Non-Viable, Slough, Subcutaneous, Skin: Dermis , Skin: Epidermis, Slough Level: Skin/Subcutaneous Tissue Debridement Description: Excisional Instrument:  Curette Bleeding: Minimum Hemostasis Achieved: Pressure Response to Treatment: Procedure was tolerated well Level of Consciousness (Post- Awake and Alert procedure): Post Debridement Measurements of Total Wound Length: (cm) 0.7 Width: (cm) 0.3 Depth: (cm) 0.1 Volume: (cm) 0.016 Character of Wound/Ulcer Post Debridement: Improved Post Procedure Diagnosis Jessica [name], Jessica [name] ([id number]) [phone].pdf Page 2 of 8 Same as Pre-procedure Electronic Signature(s) Signed: 07/11/2023 10:34:59 AM By: Duanne Guess MD FACS Signed: 07/11/2023 4:53:02 PM By: Karie Schwalbe RN Entered By: Karie Schwalbe on 07/11/2023 06:58:54 -------------------------------------------------------------------------------- HPI Details Patient Name: Date of Service: Jessica Sos [name]. 07/11/2023 9:30 A M Medical Record Number: [id number] Patient Account Number: [id number] Date of Birth/Sex: Treating RN: Dec 22, 1940 (82 y.o. F) Primary Care Provider: Ardean Larsen Other Clinician: Referring Provider: Treating Provider/Extender: Juventino Slovak, Rinka Weeks in Treatment: 7 History of Present Illness HPI Description: ADMISSION 05/23/2023 ***PATIENT UNABLE TO TOLERATE CUFFS FOR ABI*** This is a relatively healthy 82 year old woman, non-smoker and nondiabetic, with primarily orthopedic issues in her past medical history. She was referred to the clinic today by her primary care doctor to address a left lower leg wound. The wound apparently occurred in May while she was gardening. She is uncertain exactly how it happened but perhaps struck her leg on a stick or similar protruding structure. She completed a course of both clindamycin and Augmentin based upon a wound swab culture that grew methicillin sensitive Staph aureus. She has been applying some triamcinolone cream that she had from a previous wound care admission at the [facility] wound care center. 05/30/2023: The  wound measured smaller today. It is also cleaner, with just a little bit of slough on the surface. No significant periwound erythema. 06/06/2023: The wound is smaller again today. There is minimal slough on the surface. There is some dry eschar around the wound edges. Edema control is excellent. 06/13/2023: The wound is just the slightest bit smaller today. The skin edges do appear to be starting to roll inward. Edema control is good. 06/20/2023: Once again, the wound measured just the  slightest bit smaller today. There is a little bit of slough on the surface. 06/27/2023: The wound measurements are essentially unchanged. The wound surface, however, does appear a bit more robust. 07/04/2023: No significant change to the wound dimensions. The skin edges are rolling inward. There is slough on the surface. Edema control is good. 07/11/2023: The wound measured slightly smaller today. The skin edges continue to roll inward. Electronic Signature(s) Signed: 07/11/2023 10:10:03 AM By: Duanne Guess MD FACS Entered By: Duanne Guess on 07/11/2023 07:10:02 -------------------------------------------------------------------------------- Physical Exam Details Patient Name: Date of Service: Jessica Sos [name]. 07/11/2023 9:30 A M Medical Record Number: [id number] Patient Account Number: [id number] Date of Birth/Sex: Treating RN: [birth year] (82 y.o. F) Primary Care Provider: Ardean Larsen Other Clinician: Referring Provider: Treating Provider/Extender: Juventino Slovak, Rinka Weeks in Treatment: 7 Constitutional . . . . no acute distress. Respiratory Normal work of breathing on room air.Marland Kitchen Jessica [name], Jessica [name] ([id number]) [id number] Page 3 of 8 Notes 07/11/2023: The wound measured slightly smaller today. The skin edges continue to roll inward. Electronic Signature(s) Signed: 07/11/2023 10:10:36 AM By: Duanne Guess MD FACS Entered By: Duanne Guess  on 07/11/2023 07:10:36 -------------------------------------------------------------------------------- Physician Orders Details Patient Name: Date of Service: Jessica Sos [name]. 07/11/2023 9:30 A M Medical Record Number: [id number] Patient Account Number: [id number] Date of Birth/Sex: Treating RN: Sep 07, 1940 (82 y.o. Jessica [name] Primary Care Provider: Ardean Larsen Other Clinician: Referring Provider: Treating Provider/Extender: Alfonse Ras in Treatment: 7 Verbal / Phone Orders: No Diagnosis Coding ICD-10 Coding Code Description [phone] Non-pressure chronic ulcer of other part of left lower leg with fat layer exposed Follow-up Appointments ppointment in 1 week. - Dr. Lady Gary Room 3 07/18/23 at 9.30am Return A Anesthetic (In clinic) Topical Lidocaine 4% applied to wound bed Bathing/ Shower/ Hygiene May shower and wash wound with soap and water. - Keep Left leg wrap dry. Use a cast protector to keep left leg dry. Cast Protector can be purchased from [employer], [place], Medical supply stores etc. Cost ranges between $17-$30 Additional Orders / Instructions Follow Nutritious Diet - Try and increase protein to 70g-100g per day. Wound Treatment Wound #1 - Lower Leg Wound Laterality: Left Cleanser: Soap and Water 1 x Per Week/30 Days Discharge Instructions: May shower and wash wound with dial antibacterial soap and water prior to dressing change. Cleanser: Vashe 5.8 (oz) 1 x Per Week/30 Days Discharge Instructions: Cleanse the wound with Vashe prior to applying a clean dressing using gauze sponges, not tissue or cotton balls. Peri-Wound Care: Sween Lotion (Moisturizing lotion) 1 x Per Week/30 Days Discharge Instructions: Apply moisturizing lotion as directed Topical: Gentamicin 1 x Per Week/30 Days Discharge Instructions: As directed by physician Topical: Mupirocin Ointment 1 x Per Week/30 Days Discharge Instructions: Apply Mupirocin (Bactroban) as  instructed Prim Dressing: Hydrofera Blue Ready Transfer Foam, 2.5x2.5 (in/in) 1 x Per Week/30 Days ary Discharge Instructions: Apply directly to wound bed as directed Prim Dressing: Optifoam Non-Adhesive Dressing, 4x4 in 1 x Per Week/30 Days ary Discharge Instructions: Apply to foot as cushion Secondary Dressing: Woven Gauze Sponge, Non-Sterile 4x4 in 1 x Per Week/30 Days Discharge Instructions: Apply over primary dressing as directed. Secured With: Coban Self-Adherent Wrap 4x5 (in/yd) 1 x Per Week/30 Days Discharge Instructions: Secure with Coban as directed. Secured With: [employer], 4.5x3.1 (in/yd) 1 x Per Week/30 Days Discharge Instructions: Secure with Kerlix as directed. Jessica [name], Jessica [name] ([id number]) [id number] Page 4 of 8 Secured With: [employer]  Tape, 2x10 (in/yd) 1 x Per Week/30 Days Discharge Instructions: Secure dressing with tape as directed. Secured With: Tubular Netting #5 1 x Per Week/30 Days Electronic Signature(s) Signed: 07/11/2023 10:34:59 AM By: Duanne Guess MD FACS Entered By: Duanne Guess on 07/11/2023 07:10:51 -------------------------------------------------------------------------------- Problem List Details Patient Name: Date of Service: Jessica Sos [name]. 07/11/2023 9:30 A M Medical Record Number: [id number] Patient Account Number: [id number] Date of Birth/Sex: Treating RN: 03/14/1941 (82 y.o. Tommye Standard Primary Care Provider: Ardean Larsen Other Clinician: Referring Provider: Treating Provider/Extender: Alfonse Ras in Treatment: 7 Active Problems ICD-10 Encounter Code Description Active Date MDM Diagnosis [phone] Non-pressure chronic ulcer of other part of left lower leg with fat layer exposed9/24/2024 No Yes Inactive Problems Resolved Problems Electronic Signature(s) Signed: 07/11/2023 10:09:22 AM By: Duanne Guess MD FACS Entered By: Duanne Guess on 07/11/2023 07:09:22 -------------------------------------------------------------------------------- Progress Note Details Patient Name: Date of Service: Jessica Sos [name]. 07/11/2023 9:30 A M Medical Record Number: [id number] Patient Account Number: [id number] Date of Birth/Sex: Treating RN: June 05, 1941 (82 y.o. F) Primary Care Provider: Ardean Larsen Other Clinician: Referring Provider: Treating Provider/Extender: Juventino Slovak, Rinka Weeks in Treatment: 7 Subjective Chief Complaint Information obtained from Patient Patient seen for complaints of Non-Healing Wound. History of Present Illness (HPI) ADMISSION 05/23/2023 Jessica [name], Jessica [name] ([id number]) [id number] Page 5 of 8 ***PATIENT UNABLE TO TOLERATE CUFFS FOR ABI*** This is a relatively healthy 82 year old woman, non-smoker and nondiabetic, with primarily orthopedic issues in her past medical history. She was referred to the clinic today by her primary care doctor to address a left lower leg wound. The wound apparently occurred in May while she was gardening. She is uncertain exactly how it happened but perhaps struck her leg on a stick or similar protruding structure. She completed a course of both clindamycin and Augmentin based upon a wound swab culture that grew methicillin sensitive Staph aureus. She has been applying some triamcinolone cream that she had from a previous wound care admission at the [facility] wound care center. 05/30/2023: The wound measured smaller today. It is also cleaner, with just a little bit of slough on the surface. No significant periwound erythema. 06/06/2023: The wound is smaller again today. There is minimal slough on the surface. There is some dry eschar around the wound edges. Edema control is excellent. 06/13/2023: The wound is just the slightest bit smaller today. The skin edges do appear to be starting to roll inward. Edema control is  good. 06/20/2023: Once again, the wound measured just the slightest bit smaller today. There is a little bit of slough on the surface. 06/27/2023: The wound measurements are essentially unchanged. The wound surface, however, does appear a bit more robust. 07/04/2023: No significant change to the wound dimensions. The skin edges are rolling inward. There is slough on the surface. Edema control is good. 07/11/2023: The wound measured slightly smaller today. The skin edges continue to roll inward. Patient History Information obtained from Patient. Family History Unknown History. Social History Former smoker - ended on 08/30/1975, Marital Status - Divorced, Alcohol Use - Rarely - wine, Drug Use - No History, Caffeine Use - Daily - coffee1/1/19977. Medical History Eyes Patient has history of Glaucoma Cardiovascular Patient has history of Hypertension Hospitalization/Surgery History - Left Hip Replacement. - Bilateral feet Bunion and Hammer toe surgeries. - Bilateral Carpal tunnel repair. Medical A Surgical History Notes nd Cardiovascular Hx: TIA; Coronary artery disease with angina pectoris Endocrine Hx: Hypothyroidism Musculoskeletal  Hx: Scoliosis; Left foot tendon rupture Oncologic Hx: Basal cell carcinoma ( Right cheek 25 years ago) Objective Constitutional no acute distress. Vitals Time Taken: 9:26 AM, Height: 60 in, Weight: 98 lbs, BMI: 19.1, Temperature: 98.9 F, Pulse: 73 bpm, Respiratory Rate: 18 breaths/min, Blood Pressure: 122/63 mmHg. Respiratory Normal work of breathing on room air.. General Notes: 07/11/2023: The wound measured slightly smaller today. The skin edges continue to roll inward. Integumentary (Hair, Skin) Wound #1 status is Open. Original cause of wound was Trauma. The date acquired was: 12/28/2022. The wound has been in treatment 7 weeks. The wound is located on the Left Lower Leg. The wound measures 0.7cm length x 0.3cm width x 0.1cm depth; 0.165cm^2 area and  0.016cm^3 volume. There is Fat Layer (Subcutaneous Tissue) exposed. There is no tunneling or undermining noted. There is a medium amount of serous drainage noted. The wound margin is distinct with the outline attached to the wound base. There is large (67-100%) pink, pale granulation within the wound bed. There is a small (1-33%) amount of necrotic tissue within the wound bed including Adherent Slough. The periwound skin appearance had no abnormalities noted for texture. The periwound skin appearance had no abnormalities noted for moisture. The periwound skin appearance had no abnormalities noted for color. Periwound temperature was noted as No Abnormality. Assessment Jessica [name], Jessica [name] ([id number]) [id number] Page 6 of 8 Active Problems ICD-10 Non-pressure chronic ulcer of other part of left lower leg with fat layer exposed Procedures Wound #1 Pre-procedure diagnosis of Wound #1 is a Trauma, Other located on the Left Lower Leg . There was a Excisional Skin/Subcutaneous Tissue Debridement with a total area of 0.16 sq cm performed by Duanne Guess, MD. With the following instrument(s): Curette to remove Viable and Non-Viable tissue/material. Material removed includes Subcutaneous Tissue, Slough, Skin: Dermis, and Skin: Epidermis after achieving pain control using Lidocaine 4% Topical Solution. No specimens were taken. A time out was conducted at 09:55, prior to the start of the procedure. A Minimum amount of bleeding was controlled with Pressure. The procedure was tolerated well. Post Debridement Measurements: 0.7cm length x 0.3cm width x 0.1cm depth; 0.016cm^3 volume. Character of Wound/Ulcer Post Debridement is improved. Post procedure Diagnosis Wound #1: Same as Pre-Procedure Plan Follow-up Appointments: Return Appointment in 1 week. - Dr. Lady Gary Room 3 07/18/23 at 9.30am Anesthetic: (In clinic) Topical Lidocaine 4% applied to wound bed Bathing/ Shower/  Hygiene: May shower and wash wound with soap and water. - Keep Left leg wrap dry. Use a cast protector to keep left leg dry. Cast Protector can be purchased from [employer], [place], Medical supply stores etc. Cost ranges between $17-$30 Additional Orders / Instructions: Follow Nutritious Diet - Try and increase protein to 70g-100g per day. WOUND #1: - Lower Leg Wound Laterality: Left Cleanser: Soap and Water 1 x Per Week/30 Days Discharge Instructions: May shower and wash wound with dial antibacterial soap and water prior to dressing change. Cleanser: Vashe 5.8 (oz) 1 x Per Week/30 Days Discharge Instructions: Cleanse the wound with Vashe prior to applying a clean dressing using gauze sponges, not tissue or cotton balls. Peri-Wound Care: Sween Lotion (Moisturizing lotion) 1 x Per Week/30 Days Discharge Instructions: Apply moisturizing lotion as directed Topical: Gentamicin 1 x Per Week/30 Days Discharge Instructions: As directed by physician Topical: Mupirocin Ointment 1 x Per Week/30 Days Discharge Instructions: Apply Mupirocin (Bactroban) as instructed Prim Dressing: Hydrofera Blue Ready Transfer Foam, 2.5x2.5 (in/in) 1 x Per Week/30 Days ary Discharge Instructions: Apply directly to wound  bed as directed Prim Dressing: Optifoam Non-Adhesive Dressing, 4x4 in 1 x Per Week/30 Days ary Discharge Instructions: Apply to foot as cushion Secondary Dressing: Woven Gauze Sponge, Non-Sterile 4x4 in 1 x Per Week/30 Days Discharge Instructions: Apply over primary dressing as directed. Secured With: Coban Self-Adherent Wrap 4x5 (in/yd) 1 x Per Week/30 Days Discharge Instructions: Secure with Coban as directed. Secured With: [employer], 4.5x3.1 (in/yd) 1 x Per Week/30 Days Discharge Instructions: Secure with Kerlix as directed. Secured With: [employer] T ape, 2x10 (in/yd) 1 x Per Week/30 Days Discharge Instructions: Secure dressing with tape as directed. Secured With: Tubular Netting  #5 1 x Per Week/30 Days 07/11/2023: The wound measured slightly smaller today. The skin edges continue to roll inward. I used a curette to debride slough, subcutaneous tissue, and skin from the wound. She was able to tolerate the paring away the rolled in skin edges, so hopefully this will help the wound progress forward. I am going to change her contact layer to [facility] ready, just to see if this makes any difference. We will continue the topical gentamicin and mupirocin, with Kerlix and Coban wrap. Follow-up in 1 week Electronic Signature(s) Signed: 07/11/2023 10:12:01 AM By: Duanne Guess MD FACS Entered By: Duanne Guess on 07/11/2023 07:12:01 Jessica [name] ([id number]) [id number] Page 7 of 8 -------------------------------------------------------------------------------- HxROS Details Patient Name: Date of Service: Jessica Sos [name]. 07/11/2023 9:30 A M Medical Record Number: [id number] Patient Account Number: [id number] Date of Birth/Sex: Treating RN: Dec 07, 1940 (82 y.o. F) Primary Care Provider: Ardean Larsen Other Clinician: Referring Provider: Treating Provider/Extender: Juventino Slovak, Rinka Weeks in Treatment: 7 Information Obtained From Patient Eyes Medical History: Positive for: Glaucoma Cardiovascular Medical History: Positive for: Hypertension Past Medical History Notes: Hx: TIA; Coronary artery disease with angina pectoris Endocrine Medical History: Past Medical History Notes: Hx: Hypothyroidism Musculoskeletal Medical History: Past Medical History Notes: Hx: Scoliosis; Left foot tendon rupture Oncologic Medical History: Past Medical History Notes: Hx: Basal cell carcinoma ( Right cheek 25 years ago) HBO Extended History Items Eyes: Glaucoma Immunizations Pneumococcal Vaccine: Received Pneumococcal Vaccination: No Implantable Devices None Hospitalization / Surgery History Type of  Hospitalization/Surgery Left Hip Replacement Bilateral feet Bunion and Hammer toe surgeries Bilateral Carpal tunnel repair Family and Social History Unknown History: Yes; Former smoker - ended on 08/30/1975; Marital Status - Divorced; Alcohol Use: Rarely - wine; Drug Use: No History; Caffeine Use: Daily - coffee1/1/19977; Financial Concerns: No; Food, Clothing or Shelter Needs: No; Support System Lacking: No; Transportation Concerns: No Electronic Signature(s) Signed: 07/11/2023 10:34:59 AM By: Duanne Guess MD FACS Entered By: Duanne Guess on 07/11/2023 07:10:13 Jessica [name] ([id number]) [id number] Page 8 of 8 -------------------------------------------------------------------------------- SuperBill Details Patient Name: Date of Service: Jessica [name] 07/11/2023 Medical Record Number: [id number] Patient Account Number: [id number] Date of Birth/Sex: Treating RN: May 22, 1941 (82 y.o. F) Primary Care Provider: Ardean Larsen Other Clinician: Referring Provider: Treating Provider/Extender: Juventino Slovak, Rinka Weeks in Treatment: 7 Diagnosis Coding ICD-10 Codes Code Description [phone] Non-pressure chronic ulcer of other part of left lower leg with fat layer exposed Facility Procedures : CPT4 Code: [id number] Description: 11042 - DEB SUBQ TISSUE 20 SQ CM/< ICD-10 Diagnosis Description L97.822 Non-pressure chronic ulcer of other part of left lower leg with fat layer expo Modifier: sed Quantity: 1 Physician Procedures : CPT4 Code Description Modifier [id number] 99214 - WC PHYS LEVEL 4 - EST PT ICD-10 Diagnosis Description L97.822 Non-pressure chronic ulcer of other part of  left lower leg with fat layer exposed Quantity: 1 : [id number] 11042 - WC PHYS SUBQ TISS 20 SQ CM ICD-10 Diagnosis Description L97.822 Non-pressure chronic ulcer of other part of left lower leg with fat layer exposed Quantity: 1 Electronic Signature(s) Signed:  07/11/2023 10:12:24 AM By: Duanne Guess MD FACS Entered By: Duanne Guess on 07/11/2023 07:12:24

## 2023-07-11 NOTE — Progress Notes (Signed)
Jessica, [name] ([id number]) [id number] Page 1 of 7 Visit Report for 07/11/2023 Arrival Information Details Patient Name: Date of Service: Jessica [name] 07/11/2023 9:30 A M Medical Record Number: [id number] Patient Account Number: [id number] Date of Birth/Sex: Treating RN: [birth year] (82 y.o. Jessica [name] Primary Care Drue Harr: Ardean Larsen Other Clinician: Referring Jaeveon Ashland: Treating Jesseka Drinkard/Extender: Alfonse Ras in Treatment: 7 Visit Information History Since Last Visit Added or deleted any medications: No Patient Arrived: Ambulatory Any new allergies or adverse reactions: No Arrival Time: 09:26 Had a fall or experienced change in No Accompanied By: self activities of daily living that may affect Transfer Assistance: None risk of falls: Patient Identification Verified: Yes Signs or symptoms of abuse/neglect since last visito No Secondary Verification Process Completed: Yes Hospitalized since last visit: No Patient Requires Transmission-Based Precautions: No Implantable device outside of the clinic excluding No Patient Has Alerts: Yes cellular tissue based products placed in the center Patient Alerts: Unable to complete ABI L since last visit: Has Dressing in Place as Prescribed: Yes Has Compression in Place as Prescribed: Yes Pain Present Now: Yes Electronic Signature(s) Signed: 07/11/2023 4:40:40 PM By: Zenaida Deed RN, BSN Entered By: Zenaida Deed on 07/11/2023 78:29:56 -------------------------------------------------------------------------------- Encounter Discharge Information Details Patient Name: Date of Service: Jessica [name]. 07/11/2023 9:30 A M Medical Record Number: [id number] Patient Account Number: [id number] Date of Birth/Sex: Treating RN: 1941/04/05 (82 y.o. Jessica [name] Primary Care Kaitlin Ardito: Ardean Larsen Other Clinician: Referring Bronco Mcgrory: Treating  Kolbee Bogusz/Extender: Alfonse Ras in Treatment: 7 Encounter Discharge Information Items Post Procedure Vitals Discharge Condition: Stable Temperature (F): 98.9 Ambulatory Status: Ambulatory Pulse (bpm): 73 Discharge Destination: Home Respiratory Rate (breaths/min): 18 Transportation: Private Auto Blood Pressure (mmHg): 122/63 Accompanied By: self Schedule Follow-up Appointment: Yes Clinical Summary of Care: Patient Declined Electronic Signature(s) Signed: 07/11/2023 4:53:02 PM By: Karie Schwalbe RN Entered By: Karie Schwalbe on 07/11/2023 13:40:13 Jessica [name] ([id number]) [id number] Page 2 of 7 -------------------------------------------------------------------------------- Lower Extremity Assessment Details Patient Name: Date of Service: Jessica [name]. 07/11/2023 9:30 A M Medical Record Number: [id number] Patient Account Number: [id number] Date of Birth/Sex: Treating RN: Feb 21, 1941 (82 y.o. Jessica [name] Primary Care Annelie Boak: Ardean Larsen Other Clinician: Referring Harlee Eckroth: Treating January Bergthold/Extender: Juventino Slovak, Rinka Weeks in Treatment: 7 Edema Assessment Assessed: [Left: No] [Right: No] Edema: [Left: N] [Right: o] Calf Left: Right: Point of Measurement: 30 cm From Medial Instep 29.5 cm Ankle Left: Right: Point of Measurement: 9 cm From Medial Instep 17 cm Vascular Assessment Pulses: Dorsalis Pedis Palpable: [Left:Yes] Extremity colors, hair growth, and conditions: Extremity Color: [Left:Normal] Hair Growth on Extremity: [Left:No] Temperature of Extremity: [Left:Warm] Capillary Refill: [Left:< 3 seconds] Dependent Rubor: [Left:No No] Electronic Signature(s) Signed: 07/11/2023 4:40:40 PM By: Zenaida Deed RN, BSN Entered By: Zenaida Deed on 07/11/2023 06:30:28 -------------------------------------------------------------------------------- Multi Wound Chart  Details Patient Name: Date of Service: Jessica [name]. 07/11/2023 9:30 A M Medical Record Number: [id number] Patient Account Number: [id number] Date of Birth/Sex: Treating RN: 06/18/1941 (82 y.o. F) Primary Care Saavi Mceachron: Ardean Larsen Other Clinician: Referring Tashea Othman: Treating Morgen Ritacco/Extender: Juventino Slovak, Rinka Weeks in Treatment: 7 Vital Signs Height(in): 60 Pulse(bpm): 73 Weight(lbs): 98 Blood Pressure(mmHg): 122/63 Body Mass Index(BMI): 19.1 Temperature(F): 98.9 Respiratory Rate(breaths/min): 18 [1:Photos:] [N/A:N/A] Left Lower Leg N/A N/A Wound Location: Trauma N/A N/A Wounding Event: Trauma, Other N/A N/A Primary Etiology: Glaucoma, Hypertension N/A N/A Comorbid History: 12/28/2022 N/A  N/A Date Acquired: 7 N/A N/A Weeks of Treatment: Open N/A N/A Wound Status: No N/A N/A Wound Recurrence: 0.7x0.3x0.1 N/A N/A Measurements L x W x D (cm) 0.165 N/A N/A A (cm) : rea 0.016 N/A N/A Volume (cm) : 84.40% N/A N/A % Reduction in A rea: 84.90% N/A N/A % Reduction in Volume: Full Thickness Without Exposed N/A N/A Classification: Support Structures Medium N/A N/A Exudate A mount: Serous N/A N/A Exudate Type: amber N/A N/A Exudate Color: Distinct, outline attached N/A N/A Wound Margin: Large (67-100%) N/A N/A Granulation A mount: Pink, Pale N/A N/A Granulation Quality: Small (1-33%) N/A N/A Necrotic A mount: Fat Layer (Subcutaneous Tissue): Yes N/A N/A Exposed Structures: Fascia: No Tendon: No Muscle: No Joint: No Bone: No Small (1-33%) N/A N/A Epithelialization: Debridement - Excisional N/A N/A Debridement: Pre-procedure Verification/Time Out 09:55 N/A N/A Taken: Lidocaine 4% Topical Solution N/A N/A Pain Control: Subcutaneous, Slough N/A N/A Tissue Debrided: Skin/Subcutaneous Tissue N/A N/A Level: 0.16 N/A N/A Debridement A (sq cm): rea Curette N/A N/A Instrument: Minimum N/A N/A Bleeding: Pressure N/A  N/A Hemostasis A chieved: Procedure was tolerated well N/A N/A Debridement Treatment Response: 0.7x0.3x0.1 N/A N/A Post Debridement Measurements L x W x D (cm) 0.016 N/A N/A Post Debridement Volume: (cm) No Abnormalities Noted N/A N/A Periwound Skin Texture: Maceration: No N/A N/A Periwound Skin Moisture: Dry/Scaly: No No Abnormalities Noted N/A N/A Periwound Skin Color: No Abnormality N/A N/A Temperature: Debridement N/A N/A Procedures Performed: Treatment Notes Electronic Signature(s) Signed: 07/11/2023 10:09:28 AM By: Duanne Guess MD FACS Entered By: Duanne Guess on 07/11/2023 07:09:27 -------------------------------------------------------------------------------- Multi-Disciplinary Care Plan Details Patient Name: Date of Service: Jessica [name]. 07/11/2023 9:30 A M Medical Record Number: [id number] Patient Account Number: [id number] Date of Birth/Sex: Treating RN: September 15, 1940 (82 y.o. Jessica [name] Primary Care Britteney Ayotte: Ardean Larsen Other Clinician: Referring Lamaj Metoyer: Treating Azim Gillingham/Extender: Alfonse Ras in Treatment: [address], [place] [name] ([id number]) [id number] Page 4 of 7 Multidisciplinary Care Plan reviewed with physician Active Inactive Wound/Skin Impairment Nursing Diagnoses: Impaired tissue integrity Goals: Patient/caregiver will verbalize understanding of skin care regimen Date Initiated: 05/23/2023 Target Resolution Date: 08/23/2023 Goal Status: Active Interventions: Assess ulceration(s) every visit Treatment Activities: Skin care regimen initiated : 05/23/2023 Notes: Electronic Signature(s) Signed: 07/11/2023 4:40:40 PM By: Zenaida Deed RN, BSN Entered By: Zenaida Deed on 07/11/2023 06:35:46 -------------------------------------------------------------------------------- Pain Assessment Details Patient Name: Date of Service: Jessica [name]. 07/11/2023  9:30 A M Medical Record Number: [id number] Patient Account Number: [id number] Date of Birth/Sex: Treating RN: [birth year] (82 y.o. Jessica [name] Primary Care Zarai Orsborn: Ardean Larsen Other Clinician: Referring Keiarra Charon: Treating Jetty Berland/Extender: Juventino Slovak, Rinka Weeks in Treatment: 7 Active Problems Location of Pain Severity and Description of Pain Patient Has Paino Yes Site Locations Pain Location: Pain in Ulcers With Dressing Change: Yes Duration of the Pain. Constant / Intermittento Intermittent Rate the pain. Current Pain Level: 0 Worst Pain Level: 3 Least Pain Level: 0 Character of Pain Describe the Pain: Sharp Pain Management and Medication Current Pain Management: Is the Current Pain Management Adequate: Adequate How does your wound impact your activities of daily KELILA, DEVAUL [name] ([id number]) [id number] Page 5 of 7 Sleep: No Bathing: No Appetite: No Relationship With Others: No Bladder Continence: No Emotions: No Bowel Continence: No Work: No Toileting: No Drive: No Dressing: No Hobbies: No Electronic Signature(s) Signed: 07/11/2023 4:40:40 PM By: Zenaida Deed RN, BSN Entered By: Zenaida Deed on 07/11/2023 06:27:40 -------------------------------------------------------------------------------- Patient/Caregiver  Education Details Patient Name: Date of Service: Jessica [name] 11/12/2024andnbsp9:30 A M Medical Record Number: [id number] Patient Account Number: [id number] Date of Birth/Gender: Treating RN: 1941-03-17 (82 y.o. Jessica [name] Primary Care Physician: Ardean Larsen Other Clinician: Referring Physician: Treating Physician/Extender: Alfonse Ras in Treatment: 7 Education Assessment Education Provided To: Patient Education Topics Provided Venous: Methods: Explain/Verbal Responses: Reinforcements needed, State content correctly Wound/Skin  Impairment: Methods: Explain/Verbal Responses: Reinforcements needed, State content correctly Electronic Signature(s) Signed: 07/11/2023 4:40:40 PM By: Zenaida Deed RN, BSN Entered By: Zenaida Deed on 07/11/2023 06:36:13 -------------------------------------------------------------------------------- Wound Assessment Details Patient Name: Date of Service: Jessica [name]. 07/11/2023 9:30 A M Medical Record Number: [id number] Patient Account Number: [id number] Date of Birth/Sex: Treating RN: Oct 08, 1940 (82 y.o. Jessica [name] Primary Care Rossanna Spitzley: Ardean Larsen Other Clinician: Referring Makelle Marrone: Treating Joclynn Lumb/Extender: Juventino Slovak, Rinka Weeks in Treatment: 7 Wound Status Wound Number: 1 Primary Etiology: Trauma, Other Wound Location: Left Lower Leg Wound Status: Open Wounding Event: Trauma Comorbid History: Glaucoma, Hypertension Date Acquired: 12/28/2022 Weeks Of Treatment: 7 Clustered Wound: No CANTRECE, LINHARES [name] ([id number]) [id number].pdf Page 6 of 7 Photos Wound Measurements Length: (cm) 0.7 Width: (cm) 0.3 Depth: (cm) 0.1 Area: (cm) 0.165 Volume: (cm) 0.016 % Reduction in Area: 84.4% % Reduction in Volume: 84.9% Epithelialization: Small (1-33%) Tunneling: No Undermining: No Wound Description Classification: Full Thickness Without Exposed Support Structures Wound Margin: Distinct, outline attached Exudate Amount: Medium Exudate Type: Serous Exudate Color: amber Foul Odor After Cleansing: No Slough/Fibrino Yes Wound Bed Granulation Amount: Large (67-100%) Exposed Structure Granulation Quality: Pink, Pale Fascia Exposed: No Necrotic Amount: Small (1-33%) Fat Layer (Subcutaneous Tissue) Exposed: Yes Necrotic Quality: Adherent Slough Tendon Exposed: No Muscle Exposed: No Joint Exposed: No Bone Exposed: No Periwound Skin Texture Texture Color No Abnormalities Noted: Yes No Abnormalities Noted:  Yes Moisture Temperature / Pain No Abnormalities Noted: Yes Temperature: No Abnormality Treatment Notes Wound #1 (Lower Leg) Wound Laterality: Left Cleanser Soap and Water Discharge Instruction: May shower and wash wound with dial antibacterial soap and water prior to dressing change. Vashe 5.8 (oz) Discharge Instruction: Cleanse the wound with Vashe prior to applying a clean dressing using gauze sponges, not tissue or cotton balls. Peri-Wound Care Sween Lotion (Moisturizing lotion) Discharge Instruction: Apply moisturizing lotion as directed Topical Gentamicin Discharge Instruction: As directed by physician Mupirocin Ointment Discharge Instruction: Apply Mupirocin (Bactroban) as instructed Primary Dressing Hydrofera Blue Ready Transfer Foam, 2.5x2.5 (in/in) Discharge Instruction: Apply directly to wound bed as directed Optifoam Non-Adhesive Dressing, 4x4 in Discharge Instruction: Apply to foot as cushion Secondary Dressing Woven Gauze Sponge, Non-Sterile 4x4 in Discharge Instruction: Apply over primary dressing as directed. LEVEDA, CARAS [name] ([id number]) [id number] Page 7 of 7 Secured With [employer] 4x5 (in/yd) Discharge Instruction: Secure with Coban as directed. Kerlix Roll Sterile, 4.5x3.1 (in/yd) Discharge Instruction: Secure with Kerlix as directed. Transpore Surgical Tape, 2x10 (in/yd) Discharge Instruction: Secure dressing with tape as directed. Tubular Netting #5 Compression Wrap Compression Stockings Add-Ons Electronic Signature(s) Signed: 07/11/2023 4:40:40 PM By: Zenaida Deed RN, BSN Entered By: Zenaida Deed on 07/11/2023 06:35:18 -------------------------------------------------------------------------------- Vitals Details Patient Name: Date of Service: Jessica [name]. 07/11/2023 9:30 A M Medical Record Number: [id number] Patient Account Number: [id number] Date of Birth/Sex: Treating RN: 1941/01/09  (82 y.o. Jessica [name] Primary Care Ravyn Nikkel: Ardean Larsen Other Clinician: Referring Kennady Zimmerle: Treating Jillyan Plitt/Extender: Juventino Slovak, Rinka Weeks in Treatment: 7 Vital Signs Time Taken:  09:26 Temperature (F): 98.9 Height (in): 60 Pulse (bpm): 73 Weight (lbs): 98 Respiratory Rate (breaths/min): 18 Body Mass Index (BMI): 19.1 Blood Pressure (mmHg): 122/63 Reference Range: 80 - 120 mg / dl Electronic Signature(s) Signed: 07/11/2023 4:40:40 PM By: Zenaida Deed RN, BSN Entered By: Zenaida Deed on 07/11/2023 06:27:06

## 2023-07-18 ENCOUNTER — Encounter (HOSPITAL_BASED_OUTPATIENT_CLINIC_OR_DEPARTMENT_OTHER): Payer: Medicare PPO | Admitting: General Surgery

## 2023-07-18 DIAGNOSIS — I10 Essential (primary) hypertension: Secondary | ICD-10-CM | POA: Diagnosis not present

## 2023-07-18 DIAGNOSIS — E039 Hypothyroidism, unspecified: Secondary | ICD-10-CM | POA: Diagnosis not present

## 2023-07-18 DIAGNOSIS — L97822 Non-pressure chronic ulcer of other part of left lower leg with fat layer exposed: Secondary | ICD-10-CM | POA: Diagnosis not present

## 2023-07-18 DIAGNOSIS — S81802A Unspecified open wound, left lower leg, initial encounter: Secondary | ICD-10-CM | POA: Diagnosis not present

## 2023-07-18 DIAGNOSIS — I2510 Atherosclerotic heart disease of native coronary artery without angina pectoris: Secondary | ICD-10-CM | POA: Diagnosis not present

## 2023-07-18 NOTE — Progress Notes (Signed)
PEARSON, MEDARIS B ([id number]) [id number] Page 1 of 7 Visit Report for 07/18/2023 Arrival Information Details Patient Name: Date of Service: Jessica [name] 07/18/2023 9:30 A M Medical Record Number: [id number] Patient Account Number: [id number] Date of Birth/Sex: Treating RN: June 17, 1941 (82 y.o. F) Primary Care Aliviya Schoeller: Ardean Larsen Other Clinician: Referring Emilo Gras: Treating Aerabella Galasso/Extender: Juventino Slovak, Rinka Weeks in Treatment: 8 Visit Information History Since Last Visit Added or deleted any medications: No Patient Arrived: Ambulatory Any new allergies or adverse reactions: No Arrival Time: 09:24 Had a fall or experienced change in No Accompanied By: self activities of daily living that may affect Transfer Assistance: None risk of falls: Patient Identification Verified: Yes Signs or symptoms of abuse/neglect since last visito No Secondary Verification Process Completed: Yes Hospitalized since last visit: No Patient Requires Transmission-Based Precautions: No Implantable device outside of the clinic excluding No Patient Has Alerts: Yes cellular tissue based products placed in the center Patient Alerts: Unable to complete ABI L since last visit: Pain Present Now: No Electronic Signature(s) Signed: 07/18/2023 11:58:31 AM By: Dayton Scrape Entered By: Dayton Scrape on 07/18/2023 09:24:27 -------------------------------------------------------------------------------- Encounter Discharge Information Details Patient Name: Date of Service: Jessica Sos B. 07/18/2023 9:30 A M Medical Record Number: [id number] Patient Account Number: [id number] Date of Birth/Sex: Treating RN: [birth year] (82 y.o. Jessica [name] Primary Care Kendyl Bissonnette: Ardean Larsen Other Clinician: Referring Trentin Knappenberger: Treating Keshia Weare/Extender: Alfonse Ras in Treatment: 8 Encounter Discharge Information Items Post  Procedure Vitals Discharge Condition: Stable Temperature (F): 97.7 Ambulatory Status: Cane Pulse (bpm): 68 Discharge Destination: Home Respiratory Rate (breaths/min): 18 Transportation: Private Auto Blood Pressure (mmHg): 127/73 Accompanied By: self Schedule Follow-up Appointment: Yes Clinical Summary of Care: Patient Declined Electronic Signature(s) Signed: 07/18/2023 2:07:28 PM By: Karie Schwalbe RN Entered By: Karie Schwalbe on 07/18/2023 13:52:28 Jessica [name] ([id number]) [id number] Page 2 of 7 -------------------------------------------------------------------------------- Lower Extremity Assessment Details Patient Name: Date of Service: Jessica Sos B. 07/18/2023 9:30 A M Medical Record Number: [id number] Patient Account Number: [id number] Date of Birth/Sex: Treating RN: Dec 08, 1940 (82 y.o. Jessica [name] Primary Care Jaynia Fendley: Ardean Larsen Other Clinician: Referring Shatori Bertucci: Treating Traci Plemons/Extender: Juventino Slovak, Rinka Weeks in Treatment: 8 Edema Assessment Assessed: [Left: No] [Right: No] Edema: [Left: N] [Right: o] Calf Left: Right: Point of Measurement: 30 cm From Medial Instep 29.5 cm Ankle Left: Right: Point of Measurement: 9 cm From Medial Instep 17 cm Vascular Assessment Pulses: Dorsalis Pedis Palpable: [Left:Yes] Extremity colors, hair growth, and conditions: Extremity Color: [Left:Normal] Hair Growth on Extremity: [Left:No] Temperature of Extremity: [Left:Warm] Capillary Refill: [Left:< 3 seconds] Dependent Rubor: [Left:No No] Electronic Signature(s) Signed: 07/18/2023 2:07:28 PM By: Karie Schwalbe RN Entered By: Karie Schwalbe on 07/18/2023 09:50:49 -------------------------------------------------------------------------------- Multi Wound Chart Details Patient Name: Date of Service: Jessica Sos B. 07/18/2023 9:30 A M Medical Record Number: [id number] Patient Account  Number: [id number] Date of Birth/Sex: Treating RN: 23-Aug-1941 (82 y.o. F) Primary Care Sima Lindenberger: Ardean Larsen Other Clinician: Referring Joellyn Grandt: Treating Donn Wilmot/Extender: Juventino Slovak, Rinka Weeks in Treatment: 8 Vital Signs Height(in): 60 Pulse(bpm): 68 Weight(lbs): 98 Blood Pressure(mmHg): 127/73 Body Mass Index(BMI): 19.1 Temperature(F): 97.7 Respiratory Rate(breaths/min): 18 [1:Photos:] [N/A:N/A] Left Lower Leg N/A N/A Wound Location: Trauma N/A N/A Wounding Event: Trauma, Other N/A N/A Primary Etiology: Glaucoma, Hypertension N/A N/A Comorbid History: 12/28/2022 N/A N/A Date Acquired: 8 N/A N/A Weeks of Treatment: Open N/A N/A Wound Status: No N/A N/A Wound Recurrence:  0.2x0.2x0.1 N/A N/A Measurements L x W x D (cm) 0.031 N/A N/A A (cm) : rea 0.003 N/A N/A Volume (cm) : 97.10% N/A N/A % Reduction in A rea: 97.20% N/A N/A % Reduction in Volume: Full Thickness Without Exposed N/A N/A Classification: Support Structures Medium N/A N/A Exudate A mount: Serous N/A N/A Exudate Type: amber N/A N/A Exudate Color: Distinct, outline attached N/A N/A Wound Margin: Large (67-100%) N/A N/A Granulation A mount: Pink, Pale N/A N/A Granulation Quality: Small (1-33%) N/A N/A Necrotic A mount: Fat Layer (Subcutaneous Tissue): Yes N/A N/A Exposed Structures: Fascia: No Tendon: No Muscle: No Joint: No Bone: No Small (1-33%) N/A N/A Epithelialization: Debridement - Excisional N/A N/A Debridement: Pre-procedure Verification/Time Out 10:10 N/A N/A Taken: Lidocaine 4% Topical Solution N/A N/A Pain Control: Subcutaneous, Slough N/A N/A Tissue Debrided: Skin/Subcutaneous Tissue N/A N/A Level: 0.03 N/A N/A Debridement A (sq cm): rea Curette N/A N/A Instrument: Minimum N/A N/A Bleeding: Pressure N/A N/A Hemostasis A chieved: Procedure was tolerated well N/A N/A Debridement Treatment Response: 0.2x0.2x0.1 N/A N/A Post Debridement  Measurements L x W x D (cm) 0.003 N/A N/A Post Debridement Volume: (cm) No Abnormalities Noted N/A N/A Periwound Skin Texture: Maceration: No N/A N/A Periwound Skin Moisture: Dry/Scaly: No No Abnormalities Noted N/A N/A Periwound Skin Color: No Abnormality N/A N/A Temperature: Debridement N/A N/A Procedures Performed: Treatment Notes Electronic Signature(s) Signed: 07/18/2023 10:20:31 AM By: Duanne Guess MD FACS Entered By: Duanne Guess on 07/18/2023 10:20:31 -------------------------------------------------------------------------------- Multi-Disciplinary Care Plan Details Patient Name: Date of Service: Jessica Sos B. 07/18/2023 9:30 A M Medical Record Number: [id number] Patient Account Number: [id number] Date of Birth/Sex: Treating RN: [birth year] (82 y.o. Jessica [name] Primary Care Evola Hollis: Ardean Larsen Other Clinician: Referring Sparsh Callens: Treating Oswell Say/Extender: Juventino Slovak, Rinka Weeks in Treatment: [address], [place] B ([id number]) [id number] Page 4 of 7 Multidisciplinary Care Plan reviewed with physician Active Inactive Wound/Skin Impairment Nursing Diagnoses: Impaired tissue integrity Goals: Patient/caregiver will verbalize understanding of skin care regimen Date Initiated: 05/23/2023 Target Resolution Date: 08/23/2023 Goal Status: Active Interventions: Assess ulceration(s) every visit Treatment Activities: Skin care regimen initiated : 05/23/2023 Notes: Electronic Signature(s) Signed: 07/18/2023 2:07:28 PM By: Karie Schwalbe RN Entered By: Karie Schwalbe on 07/18/2023 13:50:59 -------------------------------------------------------------------------------- Pain Assessment Details Patient Name: Date of Service: Jessica Sos B. 07/18/2023 9:30 A M Medical Record Number: [id number] Patient Account Number: [id number] Date of Birth/Sex: Treating RN: 1941-06-27 (82 y.o. F) Primary  Care Radie Berges: Ardean Larsen Other Clinician: Referring Alazae Crymes: Treating Meryem Haertel/Extender: Juventino Slovak, Rinka Weeks in Treatment: 8 Active Problems Location of Pain Severity and Description of Pain Patient Has Paino No Site Locations Pain Management and Medication Current Pain Management: Electronic Signature(s) Signed: 07/18/2023 11:58:31 AM By: Dayton Scrape Entered By: Dayton Scrape on 07/18/2023 09:24:55 Bolivar Haw B ([id number]) [id number] Page 5 of 7 -------------------------------------------------------------------------------- Patient/Caregiver Education Details Patient Name: Date of Service: Jessica [name] 11/19/2024andnbsp9:30 A M Medical Record Number: [id number] Patient Account Number: [id number] Date of Birth/Gender: Treating RN: Jul 12, 1941 (82 y.o. Jessica [name] Primary Care Physician: Ardean Larsen Other Clinician: Referring Physician: Treating Physician/Extender: Alfonse Ras in Treatment: 8 Education Assessment Education Provided To: Patient Education Topics Provided Wound/Skin Impairment: Methods: Explain/Verbal Responses: State content correctly Electronic Signature(s) Signed: 07/18/2023 2:07:28 PM By: Karie Schwalbe RN Entered By: Karie Schwalbe on 07/18/2023 13:51:11 -------------------------------------------------------------------------------- Wound Assessment Details Patient Name: Date of Service: Jessica [name], Jessica Few B. 07/18/2023 9:30 A  M Medical Record Number: [id number] Patient Account Number: [id number] Date of Birth/Sex: Treating RN: 01-09-1941 (82 y.o. F) Primary Care Zaidy Absher: Ardean Larsen Other Clinician: Referring Winthrop Shannahan: Treating Jessica [name]/Extender: Juventino Slovak, Rinka Weeks in Treatment: 8 Wound Status Wound Number: 1 Primary Etiology: Trauma, Other Wound Location: Left Lower Leg Wound Status: Open Wounding Event:  Trauma Comorbid History: Glaucoma, Hypertension Date Acquired: 12/28/2022 Weeks Of Treatment: 8 Clustered Wound: No Photos Wound Measurements Jessica [name], BATCHO B ([id number]) Length: (cm) 0.2 Width: (cm) 0.2 Depth: (cm) 0.1 Area: (cm) 0.031 Volume: (cm) 0.003 [id number] Page 6 of 7 % Reduction in Area: 97.1% % Reduction in Volume: 97.2% Epithelialization: Small (1-33%) Wound Description Classification: Full Thickness Without Exposed Support Structures Wound Margin: Distinct, outline attached Exudate Amount: Medium Exudate Type: Serous Exudate Color: amber Foul Odor After Cleansing: No Slough/Fibrino Yes Wound Bed Granulation Amount: Large (67-100%) Exposed Structure Granulation Quality: Pink, Pale Fascia Exposed: No Necrotic Amount: Small (1-33%) Fat Layer (Subcutaneous Tissue) Exposed: Yes Necrotic Quality: Adherent Slough Tendon Exposed: No Muscle Exposed: No Joint Exposed: No Bone Exposed: No Periwound Skin Texture Texture Color No Abnormalities Noted: Yes No Abnormalities Noted: Yes Moisture Temperature / Pain No Abnormalities Noted: Yes Temperature: No Abnormality Treatment Notes Wound #1 (Lower Leg) Wound Laterality: Left Cleanser Soap and Water Discharge Instruction: May shower and wash wound with dial antibacterial soap and water prior to dressing change. Vashe 5.8 (oz) Discharge Instruction: Cleanse the wound with Vashe prior to applying a clean dressing using gauze sponges, not tissue or cotton balls. Peri-Wound Care Sween Lotion (Moisturizing lotion) Discharge Instruction: As needed-Apply moisturizing lotion as directed Topical Primary Dressing Hydrofera Blue Ready Transfer Foam, 2.5x2.5 (in/in) Discharge Instruction: Apply directly to wound bed as directed Secondary Dressing Zetuvit Plus Silicone Border Dressing 3x3 (in/in) Discharge Instruction: Apply silicone border over primary dressing as directed. Secured  With Compression Wrap Compression Stockings Add-Ons Electronic Signature(s) Signed: 07/18/2023 11:58:31 AM By: Dayton Scrape Entered By: Dayton Scrape on 07/18/2023 09:31:45 -------------------------------------------------------------------------------- Vitals Details Patient Name: Date of Service: Jessica Sos B. 07/18/2023 9:30 A Jessica [name] ([id number]) [id number] Page 7 of 7 Medical Record Number: [id number] Patient Account Number: [id number] Date of Birth/Sex: Treating RN: [birth year] (82 y.o. F) Primary Care Blakley Michna: Ardean Larsen Other Clinician: Referring Kyli Sorter: Treating Jessica [name]/Extender: Juventino Slovak, Rinka Weeks in Treatment: 8 Vital Signs Time Taken: 09:24 Temperature (F): 97.7 Height (in): 60 Pulse (bpm): 68 Weight (lbs): 98 Respiratory Rate (breaths/min): 18 Body Mass Index (BMI): 19.1 Blood Pressure (mmHg): 127/73 Reference Range: 80 - 120 mg / dl Electronic Signature(s) Signed: 07/18/2023 11:58:31 AM By: Dayton Scrape Entered By: Dayton Scrape on 07/18/2023 09:24:49

## 2023-07-18 NOTE — Progress Notes (Signed)
EMYLI, FRED [name] ([id number]) [id number] Page 1 of 9 Visit Report for 07/18/2023 Chief Complaint Document Details Patient Name: Date of Service: Jessica [name] 07/18/2023 9:30 A M Medical Record Number: [id number] Patient Account Number: [id number] Date of Birth/Sex: Treating RN: 10-06-1940 (82 y.o. F) Primary Care Provider: Ardean Larsen Other Clinician: Referring Provider: Treating Provider/Extender: Juventino Slovak, Rinka Weeks in Treatment: 8 Information Obtained from: Patient Chief Complaint Patient seen for complaints of Non-Healing Wound. Electronic Signature(s) Signed: 07/18/2023 10:20:37 AM By: Duanne Guess MD FACS Entered By: Duanne Guess on 07/18/2023 10:20:37 -------------------------------------------------------------------------------- Debridement Details Patient Name: Date of Service: Jessica Sos [name]. 07/18/2023 9:30 A M Medical Record Number: [id number] Patient Account Number: [id number] Date of Birth/Sex: Treating RN: September 28, 1940 (82 y.o. Jessica [name] Primary Care Provider: Ardean Larsen Other Clinician: Referring Provider: Treating Provider/Extender: Alfonse Ras in Treatment: 8 Debridement Performed for Assessment: Wound #1 Left Lower Leg Performed By: Physician Duanne Guess, MD The following information was scribed by: Karie Schwalbe The information was scribed for: Duanne Guess Debridement Type: Debridement Level of Consciousness (Pre-procedure): Awake and Alert Pre-procedure Verification/Time Out Yes - 10:10 Taken: Start Time: 10:10 Pain Control: Lidocaine 4% T opical Solution Percent of Wound Bed Debrided: 100% T Area Debrided (cm): otal 0.03 Tissue and other material debrided: Viable, Non-Viable, Slough, Subcutaneous, Skin: Dermis , Skin: Epidermis, Slough Level: Skin/Subcutaneous Tissue Debridement Description: Excisional Instrument:  Curette Bleeding: Minimum Hemostasis Achieved: Pressure Response to Treatment: Procedure was tolerated well Level of Consciousness (Post- Awake and Alert procedure): Post Debridement Measurements of Total Wound Length: (cm) 0.2 Width: (cm) 0.2 Depth: (cm) 0.1 Volume: (cm) 0.003 Character of Wound/Ulcer Post Debridement: Improved Post Procedure Diagnosis Jessica [name], Jessica [name] ([id number]) [phone].pdf Page 2 of 9 Same as Pre-procedure Electronic Signature(s) Signed: 07/18/2023 10:29:35 AM By: Duanne Guess MD FACS Signed: 07/18/2023 2:07:28 PM By: Karie Schwalbe RN Entered By: Karie Schwalbe on 07/18/2023 10:15:44 -------------------------------------------------------------------------------- HPI Details Patient Name: Date of Service: Jessica Sos [name]. 07/18/2023 9:30 A M Medical Record Number: [id number] Patient Account Number: [id number] Date of Birth/Sex: Treating RN: 1941-04-15 (82 y.o. F) Primary Care Provider: Ardean Larsen Other Clinician: Referring Provider: Treating Provider/Extender: Juventino Slovak, Rinka Weeks in Treatment: 8 History of Present Illness HPI Description: ADMISSION 05/23/2023 ***PATIENT UNABLE TO TOLERATE CUFFS FOR ABI*** This is a relatively healthy 82 year old woman, non-smoker and nondiabetic, with primarily orthopedic issues in her past medical history. She was referred to the clinic today by her primary care doctor to address a left lower leg wound. The wound apparently occurred in May while she was gardening. She is uncertain exactly how it happened but perhaps struck her leg on a stick or similar protruding structure. She completed a course of both clindamycin and Augmentin based upon a wound swab culture that grew methicillin sensitive Staph aureus. She has been applying some triamcinolone cream that she had from a previous wound care admission at the [facility] wound care center. 05/30/2023: The  wound measured smaller today. It is also cleaner, with just a little bit of slough on the surface. No significant periwound erythema. 06/06/2023: The wound is smaller again today. There is minimal slough on the surface. There is some dry eschar around the wound edges. Edema control is excellent. 06/13/2023: The wound is just the slightest bit smaller today. The skin edges do appear to be starting to roll inward. Edema control is good. 06/20/2023: Once again, the wound measured just the  slightest bit smaller today. There is a little bit of slough on the surface. 06/27/2023: The wound measurements are essentially unchanged. The wound surface, however, does appear a bit more robust. 07/04/2023: No significant change to the wound dimensions. The skin edges are rolling inward. There is slough on the surface. Edema control is good. 07/11/2023: The wound measured slightly smaller today. The skin edges continue to roll inward. 07/18/2023: The wound measured smaller today. There is some slough accumulation on the surface. She is also complaining about a spot on the back of her neck. She says that it will periodically swell and then she squeezes it, extruding waxy white material. She says it has been present for several years but it bothers her when it swells up. Electronic Signature(s) Signed: 07/18/2023 10:21:36 AM By: Duanne Guess MD FACS Entered By: Duanne Guess on 07/18/2023 10:21:36 -------------------------------------------------------------------------------- Physical Exam Details Patient Name: Date of Service: Jessica Sos [name]. 07/18/2023 9:30 A M Medical Record Number: [id number] Patient Account Number: [id number] Date of Birth/Sex: Treating RN: 11-30-1940 (82 y.o. F) Primary Care Provider: Ardean Larsen Other Clinician: Referring Provider: Treating Provider/Extender: Juventino Slovak, Rinka Weeks in Treatment: [address] EVADNE, ISBILL [name] ([id number])  [id number] Page 3 of 9 . . . Marland Kitchen no acute distress. Respiratory Normal work of breathing on room air.. Integumentary (Hair, Skin) She has a sebaceous cyst on her upper back. It is not currently inflamed or infected in appearance.. Notes 07/18/2023: The wound measured smaller today. There is some slough accumulation on the surface. She is also complaining about a spot on the back of her neck. She says that it will periodically swell and then she squeezes it, extruding waxy white material. She says it has been present for several years but it bothers her when it swells up. Electronic Signature(s) Signed: 07/18/2023 10:23:58 AM By: Duanne Guess MD FACS Entered By: Duanne Guess on 07/18/2023 10:23:58 -------------------------------------------------------------------------------- Physician Orders Details Patient Name: Date of Service: Jessica Sos [name]. 07/18/2023 9:30 A M Medical Record Number: [id number] Patient Account Number: [id number] Date of Birth/Sex: Treating RN: [birth year] (82 y.o. Jessica [name] Primary Care Provider: Ardean Larsen Other Clinician: Referring Provider: Treating Provider/Extender: Alfonse Ras in Treatment: 8 Verbal / Phone Orders: No Diagnosis Coding ICD-10 Coding Code Description [phone] Non-pressure chronic ulcer of other part of left lower leg with fat layer exposed L72.3 Sebaceous cyst Follow-up Appointments ppointment in 1 week. - Dr. Lady Gary Room 3 07/24/23 at 9:15am Return A Anesthetic (In clinic) Topical Lidocaine 4% applied to wound bed Bathing/ Shower/ Hygiene May shower and wash wound with soap and water. - Keep Left leg wrap dry. Use a cast protector to keep left leg dry. Cast Protector can be purchased from [employer], [employer], Medical supply stores etc. Cost ranges between $17-$30-if leg is wrappedf Additional Orders / Instructions Follow Nutritious Diet - Try and increase  protein to 70g-100g per day. Wound Treatment Wound #1 - Lower Leg Wound Laterality: Left Cleanser: Soap and Water 1 x Per Day/10 Days Discharge Instructions: May shower and wash wound with dial antibacterial soap and water prior to dressing change. Cleanser: Vashe 5.8 (oz) 1 x Per Day/10 Days Discharge Instructions: Cleanse the wound with Vashe prior to applying a clean dressing using gauze sponges, not tissue or cotton balls. Peri-Wound Care: Sween Lotion (Moisturizing lotion) 1 x Per Day/10 Days Discharge Instructions: As needed-Apply moisturizing lotion as directed Prim Dressing: Hydrofera Blue Ready Transfer Foam, 2.5x2.5 (in/in) (DME) (  Generic) 1 x Per Day/10 Days ary Discharge Instructions: Apply directly to wound bed as directed Secondary Dressing: Zetuvit Plus Silicone Border Dressing 3x3 (in/in) (DME) (Generic) 1 x Per Day/10 Days Discharge Instructions: Apply silicone border over primary dressing as directed. Consults General Surgery - Surgical removal of sebaceous cyst at the back of the neck. ICD10: - (ICD10 L72.3 - Sebaceous cyst) Jessica [name], Jessica [name] ([id number]) [id number] Page 4 of 9 Electronic Signature(s) Signed: 07/18/2023 11:24:55 AM By: Duanne Guess MD FACS Signed: 07/18/2023 2:07:28 PM By: Karie Schwalbe RN Previous Signature: 07/18/2023 10:29:35 AM Version By: Duanne Guess MD FACS Entered By: Karie Schwalbe on 07/18/2023 10:40:00 Prescription 07/18/2023 -------------------------------------------------------------------------------- [place] Haw [name]. Duanne Guess MD Patient Name: Provider: 1941-08-18 [id number] Date of Birth: NPI#: [id number] Sex: DEA #: [phone] [id number] Phone #: License #: UPN: Patient Address: [address] Eligha Bridegroom [facility] [place], [place] [address] [address] [place], [place] [address] [phone] Allergies Gluten  Protein Provider's Orders General Surgery - ICD10: L72.3 - Surgical removal of sebaceous cyst at the back of the neck. ICD10: Hand Signature: Date(s): Electronic Signature(s) Signed: 07/18/2023 11:24:55 AM By: Duanne Guess MD FACS Signed: 07/18/2023 2:07:28 PM By: Karie Schwalbe RN Previous Signature: 07/18/2023 10:29:04 AM Version By: Duanne Guess MD FACS Entered By: Karie Schwalbe on 07/18/2023 10:40:00 -------------------------------------------------------------------------------- Problem List Details Patient Name: Date of Service: Jessica Sos [name]. 07/18/2023 9:30 A M Medical Record Number: [id number] Patient Account Number: [id number] Date of Birth/Sex: Treating RN: July 11, 1941 (82 y.o. F) Primary Care Provider: Ardean Larsen Other Clinician: Referring Provider: Treating Provider/Extender: Juventino Slovak, Rinka Weeks in Treatment: 8 Active Problems ICD-10 Encounter Code Description Active Date MDM Diagnosis [phone] Non-pressure chronic ulcer of other part of left lower leg with fat layer 05/23/2023 No Yes exposed L72.3 Sebaceous cyst 07/18/2023 No Yes Jessica [name], Jessica [name] ([id number]) [phone].pdf Page 5 of 9 Inactive Problems Resolved Problems Electronic Signature(s) Signed: 07/18/2023 10:18:15 AM By: Duanne Guess MD FACS Previous Signature: 07/18/2023 10:12:35 AM Version By: Duanne Guess MD FACS Entered By: Duanne Guess on 07/18/2023 10:18:15 -------------------------------------------------------------------------------- Progress Note Details Patient Name: Date of Service: Jessica Sos [name]. 07/18/2023 9:30 A M Medical Record Number: [id number] Patient Account Number: [id number] Date of Birth/Sex: Treating RN: 02-26-1941 (82 y.o. F) Primary Care Provider: Ardean Larsen Other Clinician: Referring Provider: Treating Provider/Extender: Juventino Slovak, Rinka Weeks in Treatment:  8 Subjective Chief Complaint Information obtained from Patient Patient seen for complaints of Non-Healing Wound. History of Present Illness (HPI) ADMISSION 05/23/2023 ***PATIENT UNABLE TO TOLERATE CUFFS FOR ABI*** This is a relatively healthy 82 year old woman, non-smoker and nondiabetic, with primarily orthopedic issues in her past medical history. She was referred to the clinic today by her primary care doctor to address a left lower leg wound. The wound apparently occurred in May while she was gardening. She is uncertain exactly how it happened but perhaps struck her leg on a stick or similar protruding structure. She completed a course of both clindamycin and Augmentin based upon a wound swab culture that grew methicillin sensitive Staph aureus. She has been applying some triamcinolone cream that she had from a previous wound care admission at the [facility] wound care center. 05/30/2023: The wound measured smaller today. It is also cleaner, with just a little bit of slough on the surface. No significant periwound erythema. 06/06/2023: The wound is smaller again today. There is minimal slough on the surface. There is some  dry eschar around the wound edges. Edema control is excellent. 06/13/2023: The wound is just the slightest bit smaller today. The skin edges do appear to be starting to roll inward. Edema control is good. 06/20/2023: Once again, the wound measured just the slightest bit smaller today. There is a little bit of slough on the surface. 06/27/2023: The wound measurements are essentially unchanged. The wound surface, however, does appear a bit more robust. 07/04/2023: No significant change to the wound dimensions. The skin edges are rolling inward. There is slough on the surface. Edema control is good. 07/11/2023: The wound measured slightly smaller today. The skin edges continue to roll inward. 07/18/2023: The wound measured smaller today. There is some slough accumulation on the  surface. She is also complaining about a spot on the back of her neck. She says that it will periodically swell and then she squeezes it, extruding waxy white material. She says it has been present for several years but it bothers her when it swells up. Patient History Information obtained from Patient. Family History Unknown History. Social History Former smoker - ended on 08/30/1975, Marital Status - Divorced, Alcohol Use - Rarely - wine, Drug Use - No History, Caffeine Use - Daily - coffee1/1/19977. Medical History Eyes Patient has history of Glaucoma Cardiovascular Patient has history of Hypertension Hospitalization/Surgery History - Left Hip Replacement. - Bilateral feet Bunion and Hammer toe surgeries. - Bilateral Carpal tunnel repair. Jessica [name], Jessica [name] ([id number]) [id number] Page 6 of 9 Medical A Surgical History Notes nd Cardiovascular Hx: TIA; Coronary artery disease with angina pectoris Endocrine Hx: Hypothyroidism Musculoskeletal Hx: Scoliosis; Left foot tendon rupture Oncologic Hx: Basal cell carcinoma ( Right cheek 25 years ago) Objective Constitutional no acute distress. Vitals Time Taken: 9:24 AM, Height: 60 in, Weight: 98 lbs, BMI: 19.1, Temperature: 97.7 F, Pulse: 68 bpm, Respiratory Rate: 18 breaths/min, Blood Pressure: 127/73 mmHg. Respiratory Normal work of breathing on room air.. General Notes: 07/18/2023: The wound measured smaller today. There is some slough accumulation on the surface. She is also complaining about a spot on the back of her neck. She says that it will periodically swell and then she squeezes it, extruding waxy white material. She says it has been present for several years but it bothers her when it swells up. Integumentary (Hair, Skin) She has a sebaceous cyst on her upper back. It is not currently inflamed or infected in appearance.. Wound #1 status is Open. Original cause of wound was Trauma. The date  acquired was: 12/28/2022. The wound has been in treatment 8 weeks. The wound is located on the Left Lower Leg. The wound measures 0.2cm length x 0.2cm width x 0.1cm depth; 0.031cm^2 area and 0.003cm^3 volume. There is Fat Layer (Subcutaneous Tissue) exposed. There is a medium amount of serous drainage noted. The wound margin is distinct with the outline attached to the wound base. There is large (67-100%) pink, pale granulation within the wound bed. There is a small (1-33%) amount of necrotic tissue within the wound bed including Adherent Slough. The periwound skin appearance had no abnormalities noted for texture. The periwound skin appearance had no abnormalities noted for moisture. The periwound skin appearance had no abnormalities noted for color. Periwound temperature was noted as No Abnormality. Assessment Active Problems ICD-10 Non-pressure chronic ulcer of other part of left lower leg with fat layer exposed Sebaceous cyst Procedures Wound #1 Pre-procedure diagnosis of Wound #1 is a Trauma, Other located on the Left Lower Leg . There was a Excisional Skin/Subcutaneous  Tissue Debridement with a total area of 0.03 sq cm performed by Duanne Guess, MD. With the following instrument(s): Curette to remove Viable and Non-Viable tissue/material. Material removed includes Subcutaneous Tissue, Slough, Skin: Dermis, and Skin: Epidermis after achieving pain control using Lidocaine 4% Topical Solution. No specimens were taken. A time out was conducted at 10:10, prior to the start of the procedure. A Minimum amount of bleeding was controlled with Pressure. The procedure was tolerated well. Post Debridement Measurements: 0.2cm length x 0.2cm width x 0.1cm depth; 0.003cm^3 volume. Character of Wound/Ulcer Post Debridement is improved. Post procedure Diagnosis Wound #1: Same as Pre-Procedure Plan Follow-up Appointments: Return Appointment in 1 week. - Dr. Lady Gary Room 3 07/24/23 at  9:15am Anesthetic: (In clinic) Topical Lidocaine 4% applied to wound bed Bathing/ Shower/ Hygiene: May shower and wash wound with soap and water. - Keep Left leg wrap dry. Use a cast protector to keep left leg dry. Cast Protector can be purchased from [place], [place] [name] ([id number]) [id number] Page 7 of 9 [employer], [employer], Medical supply stores etc. Cost ranges between $17-$30-if leg is wrappedf Additional Orders / Instructions: Follow Nutritious Diet - Try and increase protein to 70g-100g per day. Consults ordered were: General Surgery - Surgical removal of sebaceous cyst at the back of the neck. ICD10: WOUND #1: - Lower Leg Wound Laterality: Left Cleanser: Soap and Water 1 x Per Day/10 Days Discharge Instructions: May shower and wash wound with dial antibacterial soap and water prior to dressing change. Cleanser: Vashe 5.8 (oz) 1 x Per Day/10 Days Discharge Instructions: Cleanse the wound with Vashe prior to applying a clean dressing using gauze sponges, not tissue or cotton balls. Peri-Wound Care: Sween Lotion (Moisturizing lotion) 1 x Per Day/10 Days Discharge Instructions: As needed-Apply moisturizing lotion as directed Prim Dressing: Hydrofera Blue Ready Transfer Foam, 2.5x2.5 (in/in) (DME) (Generic) 1 x Per Day/10 Days ary Discharge Instructions: Apply directly to wound bed as directed Secondary Dressing: Zetuvit Plus Silicone Border Dressing 3x3 (in/in) (DME) (Generic) 1 x Per Day/10 Days Discharge Instructions: Apply silicone border over primary dressing as directed. 07/18/2023: The wound measured smaller today. There is some slough accumulation on the surface. She is also complaining about a spot on the back of her neck. She says that it will periodically swell and then she squeezes it, extruding waxy white material. She says it has been present for several years but it bothers her when it swells up. I used a curette to debride slough, skin, and  subcutaneous tissue from her leg wound. She told us today that in the past, when she has had other ulcers, they seem to close up better if she changes the dressing every day so we will initiate this. We will continue using Hydrofera Blue. She has a tube of mupirocin at home and I have asked her to continue to apply this. The lesion on the back of her neck/upper back is a sebaceous cyst. I told her it could stay in place unless she strongly desired to have it excised. She said that she would like it removed so we will place referral to general surgery for excision of the cyst. Follow-up in 1 week. Electronic Signature(s) Signed: 07/18/2023 10:25:32 AM By: Duanne Guess MD FACS Entered By: Duanne Guess on 07/18/2023 10:25:31 -------------------------------------------------------------------------------- HxROS Details Patient Name: Date of Service: Jessica Sos [name]. 07/18/2023 9:30 A M Medical Record Number: [id number] Patient Account Number: [id number] Date of Birth/Sex: Treating RN: 05-31-1941 (82 y.o. F) Primary Care Provider:  Pahwani, Rinka Other Clinician: Referring Provider: Treating Provider/Extender: Juventino Slovak, Rinka Weeks in Treatment: 8 Information Obtained From Patient Eyes Medical History: Positive for: Glaucoma Cardiovascular Medical History: Positive for: Hypertension Past Medical History Notes: Hx: TIA; Coronary artery disease with angina pectoris Endocrine Medical History: Past Medical History Notes: Hx: Hypothyroidism Musculoskeletal Medical History: Past Medical History Notes: Hx: Scoliosis; Left foot tendon rupture Oncologic Jessica [name], Jessica [name] ([id number]) [id number] Page 8 of 9 Medical History: Past Medical History Notes: Hx: Basal cell carcinoma ( Right cheek 25 years ago) HBO Extended History Items Eyes: Glaucoma Immunizations Pneumococcal Vaccine: Received Pneumococcal Vaccination:  No Implantable Devices None Hospitalization / Surgery History Type of Hospitalization/Surgery Left Hip Replacement Bilateral feet Bunion and Hammer toe surgeries Bilateral Carpal tunnel repair Family and Social History Unknown History: Yes; Former smoker - ended on 08/30/1975; Marital Status - Divorced; Alcohol Use: Rarely - wine; Drug Use: No History; Caffeine Use: Daily - coffee1/1/19977; Financial Concerns: No; Food, Clothing or Shelter Needs: No; Support System Lacking: No; Transportation Concerns: No Electronic Signature(s) Signed: 07/18/2023 10:29:35 AM By: Duanne Guess MD FACS Entered By: Duanne Guess on 07/18/2023 10:21:43 -------------------------------------------------------------------------------- SuperBill Details Patient Name: Date of Service: Jessica [name] 07/18/2023 Medical Record Number: [id number] Patient Account Number: [id number] Date of Birth/Sex: Treating RN: [birth year] (82 y.o. F) Primary Care Provider: Ardean Larsen Other Clinician: Referring Provider: Treating Provider/Extender: Juventino Slovak, Rinka Weeks in Treatment: 8 Diagnosis Coding ICD-10 Codes Code Description [phone] Non-pressure chronic ulcer of other part of left lower leg with fat layer exposed L72.3 Sebaceous cyst Facility Procedures : CPT4 Code: [id number] Description: 11042 - DEB SUBQ TISSUE 20 SQ CM/< ICD-10 Diagnosis Description L97.822 Non-pressure chronic ulcer of other part of left lower leg with fat layer expo Modifier: sed Quantity: 1 Physician Procedures : CPT4 Code Description Modifier [id number] - WC PHYS LEVEL 4 - EST PT 25 ICD-10 Diagnosis Description L97.822 Non-pressure chronic ulcer of other part of left lower leg with fat layer exposed L72.3 Sebaceous cyst Quantity: 1 : [phone] 11042 - WC PHYS SUBQ TISS 20 SQ CM ICD-10 Diagnosis Description Jessica [name], Jessica [name] ([id number]) [phone] Non-pressure chronic ulcer of  other part of left lower leg with fat layer exposed Quantity: 1 1227.pdf Page 9 of 9 Electronic Signature(s) Signed: 07/18/2023 10:25:49 AM By: Duanne Guess MD FACS Entered By: Duanne Guess on 07/18/2023 10:25:49

## 2023-07-20 DIAGNOSIS — S81809A Unspecified open wound, unspecified lower leg, initial encounter: Secondary | ICD-10-CM | POA: Diagnosis not present

## 2023-07-20 DIAGNOSIS — S81802A Unspecified open wound, left lower leg, initial encounter: Secondary | ICD-10-CM | POA: Diagnosis not present

## 2023-07-24 ENCOUNTER — Encounter (HOSPITAL_BASED_OUTPATIENT_CLINIC_OR_DEPARTMENT_OTHER): Payer: Medicare PPO | Admitting: General Surgery

## 2023-07-24 DIAGNOSIS — I10 Essential (primary) hypertension: Secondary | ICD-10-CM | POA: Diagnosis not present

## 2023-07-24 DIAGNOSIS — S81802A Unspecified open wound, left lower leg, initial encounter: Secondary | ICD-10-CM | POA: Diagnosis not present

## 2023-07-24 DIAGNOSIS — I2510 Atherosclerotic heart disease of native coronary artery without angina pectoris: Secondary | ICD-10-CM | POA: Diagnosis not present

## 2023-07-24 DIAGNOSIS — L97822 Non-pressure chronic ulcer of other part of left lower leg with fat layer exposed: Secondary | ICD-10-CM | POA: Diagnosis not present

## 2023-07-24 DIAGNOSIS — E039 Hypothyroidism, unspecified: Secondary | ICD-10-CM | POA: Diagnosis not present

## 2023-07-24 NOTE — Progress Notes (Signed)
ALMA, BEITH B ([id number]) [id number] Page 1 of 7 Visit Report for 07/24/2023 Arrival Information Details Patient Name: Date of Service: Jessica [name] 07/24/2023 9:15 A M Medical Record Number: [id number] Patient Account Number: [id number] Date of Birth/Sex: Treating RN: 1941/06/04 (82 y.o. F) Primary Care Markiya Keefe: Ardean Larsen Other Clinician: Referring Leondra Cullin: Treating Murl Golladay/Extender: Juventino Slovak, Rinka Weeks in Treatment: 8 Visit Information History Since Last Visit Added or deleted any medications: No Patient Arrived: Cane Any new allergies or adverse reactions: No Arrival Time: 09:17 Had a fall or experienced change in No Accompanied By: self activities of daily living that may affect Transfer Assistance: None risk of falls: Patient Identification Verified: Yes Signs or symptoms of abuse/neglect since last visito No Secondary Verification Process Completed: Yes Hospitalized since last visit: No Patient Requires Transmission-Based Precautions: No Implantable device outside of the clinic excluding No Patient Has Alerts: Yes cellular tissue based products placed in the center Patient Alerts: Unable to complete ABI L since last visit: Pain Present Now: No Electronic Signature(s) Signed: 07/24/2023 10:21:52 AM By: Dayton Scrape Entered By: Dayton Scrape on 07/24/2023 09:18:04 -------------------------------------------------------------------------------- Encounter Discharge Information Details Patient Name: Date of Service: Jessica Sos B. 07/24/2023 9:15 A M Medical Record Number: [id number] Patient Account Number: [id number] Date of Birth/Sex: Treating RN: May 23, 1941 (82 y.o. Jessica [name] Primary Care [employer] Belay: Ardean Larsen Other Clinician: Referring Shakedra Beam: Treating Adyn Serna/Extender: Alfonse Ras in Treatment: 8 Encounter Discharge Information Items Post  Procedure Vitals Discharge Condition: Stable Temperature (F): 97.3 Ambulatory Status: Ambulatory Pulse (bpm): 65 Discharge Destination: Home Respiratory Rate (breaths/min): 18 Transportation: Private Auto Blood Pressure (mmHg): 135/79 Accompanied By: self Schedule Follow-up Appointment: Yes Clinical Summary of Care: Patient Declined Electronic Signature(s) Signed: 07/24/2023 3:57:27 PM By: Samuella Bruin Entered By: Samuella Bruin on 07/24/2023 09:55:09 Jessica [name] ([id number]) [id number] Page 2 of 7 -------------------------------------------------------------------------------- Lower Extremity Assessment Details Patient Name: Date of Service: Jessica [name] 07/24/2023 9:15 A M Medical Record Number: [id number] Patient Account Number: [id number] Date of Birth/Sex: Treating RN: Jan 02, 1941 (82 y.o. Jessica [name] Primary Care Chane Cowden: Ardean Larsen Other Clinician: Referring Lynzee Lindquist: Treating Aloma Boch/Extender: Juventino Slovak, Rinka Weeks in Treatment: 8 Edema Assessment Assessed: [Left: No] [Right: No] Edema: [Left: N] [Right: o] Calf Left: Right: Point of Measurement: 30 cm From Medial Instep 30 cm Ankle Left: Right: Point of Measurement: 9 cm From Medial Instep 18 cm Vascular Assessment Pulses: Dorsalis Pedis Palpable: [Left:Yes] Extremity colors, hair growth, and conditions: Extremity Color: [Left:Normal] Hair Growth on Extremity: [Left:No] Temperature of Extremity: [Left:Warm] Capillary Refill: [Left:< 3 seconds] Dependent Rubor: [Left:No No] Electronic Signature(s) Signed: 07/24/2023 11:59:44 AM By: Karie Schwalbe RN Entered By: Karie Schwalbe on 07/24/2023 09:27:08 -------------------------------------------------------------------------------- Multi Wound Chart Details Patient Name: Date of Service: Jessica Sos B. 07/24/2023 9:15 A M Medical Record Number: [id number] Patient  Account Number: [id number] Date of Birth/Sex: Treating RN: [birth year] (82 y.o. F) Primary Care Naeemah Jasmer: Ardean Larsen Other Clinician: Referring Kacee Koren: Treating Alberto Pina/Extender: Juventino Slovak, Rinka Weeks in Treatment: 8 Vital Signs Height(in): 60 Pulse(bpm): 65 Weight(lbs): 98 Blood Pressure(mmHg): 135/79 Body Mass Index(BMI): 19.1 Temperature(F): 97.3 Respiratory Rate(breaths/min): 18 [1:Photos:] [N/A:N/A] Left Lower Leg N/A N/A Wound Location: Trauma N/A N/A Wounding Event: Trauma, Other N/A N/A Primary Etiology: Glaucoma, Hypertension N/A N/A Comorbid History: 12/28/2022 N/A N/A Date Acquired: 8 N/A N/A Weeks of Treatment: Open N/A N/A Wound Status: No N/A N/A Wound Recurrence: 0.2x0.2x0.1  N/A N/A Measurements L x W x D (cm) 0.031 N/A N/A A (cm) : rea 0.003 N/A N/A Volume (cm) : 97.10% N/A N/A % Reduction in A rea: 97.20% N/A N/A % Reduction in Volume: Full Thickness Without Exposed N/A N/A Classification: Support Structures Medium N/A N/A Exudate Amount: Serous N/A N/A Exudate Type: amber N/A N/A Exudate Color: Distinct, outline attached N/A N/A Wound Margin: Large (67-100%) N/A N/A Granulation Amount: Pink, Pale N/A N/A Granulation Quality: Small (1-33%) N/A N/A Necrotic Amount: Fat Layer (Subcutaneous Tissue): Yes N/A N/A Exposed Structures: Fascia: No Tendon: No Muscle: No Joint: No Bone: No Small (1-33%) N/A N/A Epithelialization: No Abnormalities Noted N/A N/A Periwound Skin Texture: Maceration: No N/A N/A Periwound Skin Moisture: Dry/Scaly: No No Abnormalities Noted N/A N/A Periwound Skin Color: No Abnormality N/A N/A Temperature: Treatment Notes Electronic Signature(s) Signed: 07/24/2023 9:26:52 AM By: Jessica Guess MD FACS Entered By: Jessica [name] on 07/24/2023 09:26:52 -------------------------------------------------------------------------------- Multi-Disciplinary Care Plan Details Patient Name:  Date of Service: Jessica Sos B. 07/24/2023 9:15 A M Medical Record Number: [id number] Patient Account Number: [id number] Date of Birth/Sex: Treating RN: May 28, 1941 (82 y.o. Jessica [name] Primary Care Corleone Biegler: Ardean Larsen Other Clinician: Referring Dreden Rivere: Treating Moet Mikulski/Extender: Alfonse Ras in Treatment: 8 Multidisciplinary Care Plan reviewed with physician Active Inactive Wound/Skin Impairment Nursing Diagnoses: Impaired tissue integrity Goals: Patient/caregiver will verbalize understanding of skin care regimen Date Initiated: 05/23/2023 Target Resolution Date: 08/23/2023 TAKEA, CARLOW ([id number]) [phone].pdf Page 4 of 7 Goal Status: Active Interventions: Assess ulceration(s) every visit Treatment Activities: Skin care regimen initiated : 05/23/2023 Notes: Electronic Signature(s) Signed: 07/24/2023 11:59:44 AM By: Karie Schwalbe RN Entered By: Karie Schwalbe on 07/24/2023 01:02:72 -------------------------------------------------------------------------------- Pain Assessment Details Patient Name: Date of Service: Jessica Sos B. 07/24/2023 9:15 A M Medical Record Number: [id number] Patient Account Number: [id number] Date of Birth/Sex: Treating RN: [birth year] (82 y.o. F) Primary Care Keiona Jenison: Ardean Larsen Other Clinician: Referring Aurore Redinger: Treating Nahmir Zeidman/Extender: Juventino Slovak, Rinka Weeks in Treatment: 8 Active Problems Location of Pain Severity and Description of Pain Patient Has Paino No Site Locations Pain Management and Medication Current Pain Management: Electronic Signature(s) Signed: 07/24/2023 10:21:52 AM By: Dayton Scrape Entered By: Dayton Scrape on 07/24/2023 09:18:27 -------------------------------------------------------------------------------- Patient/Caregiver Education Details Patient Name: Date of Service: Jessica [name]  11/25/2024andnbsp9:15 A M Medical Record Number: [id number] Patient Account Number: [id number] Date of Birth/Gender: Treating RN: [birth year] (82 y.o. [address] [place], [place] B ([id number]) [id number] Page 5 of 7 Primary Care Physician: Ardean Larsen Other Clinician: Referring Physician: Treating Physician/Extender: Alfonse Ras in Treatment: 8 Education Assessment Education Provided To: Patient Education Topics Provided Wound/Skin Impairment: Methods: Explain/Verbal Responses: State content correctly Electronic Signature(s) Signed: 07/24/2023 11:59:44 AM By: Karie Schwalbe RN Entered By: Karie Schwalbe on 07/24/2023 09:26:48 -------------------------------------------------------------------------------- Wound Assessment Details Patient Name: Date of Service: Jessica Sos B. 07/24/2023 9:15 A M Medical Record Number: [id number] Patient Account Number: [id number] Date of Birth/Sex: Treating RN: [birth year] (82 y.o. Jessica [name] Primary Care Yuvraj Pfeifer: Ardean Larsen Other Clinician: Referring Alessio Bogan: Treating Liisa Picone/Extender: Juventino Slovak, Rinka Weeks in Treatment: 8 Wound Status Wound Number: 1 Primary Etiology: Trauma, Other Wound Location: Left Lower Leg Wound Status: Open Wounding Event: Trauma Comorbid History: Glaucoma, Hypertension Date Acquired: 12/28/2022 Weeks Of Treatment: 8 Clustered Wound: No Photos Wound Measurements Length: (cm) 0.2 Width: (cm) 0.2 Depth: (cm) 0.2 Area: (cm) 0.031 Volume: (cm) 0.006 %  Reduction in Area: 97.1% % Reduction in Volume: 94.3% Epithelialization: Small (1-33%) Tunneling: No Undermining: No Wound Description Classification: Full Thickness Without Exposed Support Structures Wound Margin: Distinct, outline attached Exudate Amount: Medium Exudate Type: Serous Exudate Color: amber KYNLIE, HYUN B ([id number]) Wound  Bed Granulation Amount: Large (67-100%) Granulation Quality: Pink, Pale Necrotic Amount: Small (1-33%) Foul Odor After Cleansing: No Slough/Fibrino Yes [phone].pdf Page 6 of 7 Exposed Structure Fascia Exposed: No Fat Layer (Subcutaneous Tissue) Exposed: Yes Tendon Exposed: No Muscle Exposed: No Joint Exposed: No Bone Exposed: No Periwound Skin Texture Texture Color No Abnormalities Noted: Yes No Abnormalities Noted: Yes Moisture Temperature / Pain No Abnormalities Noted: Yes Temperature: No Abnormality Treatment Notes Wound #1 (Lower Leg) Wound Laterality: Left Cleanser Soap and Water Discharge Instruction: May shower and wash wound with dial antibacterial soap and water prior to dressing change. Vashe 5.8 (oz) Discharge Instruction: Cleanse the wound with Vashe prior to applying a clean dressing using gauze sponges, not tissue or cotton balls. Peri-Wound Care Sween Lotion (Moisturizing lotion) Discharge Instruction: As needed-Apply moisturizing lotion as directed Topical Gentamicin Discharge Instruction: As directed by physician Mupirocin Ointment Discharge Instruction: Apply Mupirocin (Bactroban) as instructed Skintegrity Hydrogel 4 (oz) Discharge Instruction: Apply hydrogel as directed Primary Dressing Promogran Prisma Matrix, 4.34 (sq in) (silver collagen) Discharge Instruction: Moisten collagen with saline or hydrogel Secondary Dressing Optifoam Non-Adhesive Dressing, 4x4 in Discharge Instruction: Apply over primary dressing as directed. Woven Gauze Sponge, Non-Sterile 4x4 in Discharge Instruction: Apply over primary dressing as directed. Secured With Compression Wrap Kerlix Roll 4.5x3.1 (in/yd) Discharge Instruction: Apply Kerlix and Coban compression as directed. Coban Self-Adherent Wrap 4x5 (in/yd) Discharge Instruction: Apply over Kerlix as directed. Compression Stockings Add-Ons Electronic Signature(s) Signed: 07/24/2023  3:57:27 PM By: Samuella Bruin Entered By: Samuella Bruin on 07/24/2023 09:30:23 Jessica [name] ([id number]) [id number] Page 7 of 7 -------------------------------------------------------------------------------- Vitals Details Patient Name: Date of Service: Jessica [name] 07/24/2023 9:15 A M Medical Record Number: [id number] Patient Account Number: [id number] Date of Birth/Sex: Treating RN: 03/10/1941 (82 y.o. F) Primary Care Zavon Hyson: Ardean Larsen Other Clinician: Referring Nadelyn Enriques: Treating Nashia Remus/Extender: Juventino Slovak, Rinka Weeks in Treatment: 8 Vital Signs Time Taken: 09:18 Temperature (F): 97.3 Height (in): 60 Pulse (bpm): 65 Weight (lbs): 98 Respiratory Rate (breaths/min): 18 Body Mass Index (BMI): 19.1 Blood Pressure (mmHg): 135/79 Reference Range: 80 - 120 mg / dl Electronic Signature(s) Signed: 07/24/2023 10:21:52 AM By: Dayton Scrape Entered By: Dayton Scrape on 07/24/2023 09:18:21

## 2023-07-24 NOTE — Progress Notes (Addendum)
RASEEL, LINDO [name] ([id number]) [id number] Page 1 of 9 Visit Report for 07/24/2023 Chief Complaint Document Details Patient Name: Date of Service: Jessica [name] 07/24/2023 9:15 A M Medical Record Number: [id number] Patient Account Number: [id number] Date of Birth/Sex: Treating RN: [birth year] (82 y.o. F) Primary Care Provider: Ardean Larsen Other Clinician: Referring Provider: Treating Provider/Extender: Juventino Slovak, Rinka Weeks in Treatment: 8 Information Obtained from: Patient Chief Complaint Patient seen for complaints of Non-Healing Wound. Electronic Signature(s) Signed: 07/24/2023 9:26:59 AM By: Duanne Guess MD FACS Entered By: Duanne Guess on 07/24/2023 09:26:59 -------------------------------------------------------------------------------- Debridement Details Patient Name: Date of Service: Jessica Sos [name]. 07/24/2023 9:15 A M Medical Record Number: [id number] Patient Account Number: [id number] Date of Birth/Sex: Treating RN: 1941-06-02 (82 y.o. Jessica [name] Primary Care Provider: Ardean Larsen Other Clinician: Referring Provider: Treating Provider/Extender: Alfonse Ras in Treatment: 8 Debridement Performed for Assessment: Wound #1 Left Lower Leg Performed By: Physician Duanne Guess, MD The following information was scribed by: Samuella Bruin The information was scribed for: Duanne Guess Debridement Type: Debridement Level of Consciousness (Pre-procedure): Awake and Alert Pre-procedure Verification/Time Out Yes - 09:28 Taken: Start Time: 09:28 Pain Control: Lidocaine 4% T opical Solution Percent of Wound Bed Debrided: 100% T Area Debrided (cm): otal 0.03 Tissue and other material debrided: Non-Viable, Slough, Subcutaneous, Slough Level: Skin/Subcutaneous Tissue Debridement Description: Excisional Instrument: Curette Bleeding: Minimum Hemostasis  Achieved: Pressure Response to Treatment: Procedure was tolerated well Level of Consciousness (Post- Awake and Alert procedure): Post Debridement Measurements of Total Wound Length: (cm) 0.2 Width: (cm) 0.2 Depth: (cm) 0.2 Volume: (cm) 0.006 Character of Wound/Ulcer Post Debridement: Improved Post Procedure Diagnosis Jessica [name], Jessica [name] ([id number]) [id number] Page 2 of 9 Same as Pre-procedure Electronic Signature(s) Signed: 07/24/2023 12:19:03 PM By: Duanne Guess MD FACS Signed: 07/24/2023 3:57:27 PM By: Samuella Bruin Entered By: Samuella Bruin on 07/24/2023 09:47:09 -------------------------------------------------------------------------------- HPI Details Patient Name: Date of Service: Jessica Sos [name]. 07/24/2023 9:15 A M Medical Record Number: [id number] Patient Account Number: [id number] Date of Birth/Sex: Treating RN: 05-Aug-1941 (82 y.o. F) Primary Care Provider: Ardean Larsen Other Clinician: Referring Provider: Treating Provider/Extender: Juventino Slovak, Rinka Weeks in Treatment: 8 History of Present Illness HPI Description: ADMISSION 05/23/2023 ***PATIENT UNABLE TO TOLERATE CUFFS FOR ABI*** This is a relatively healthy 82 year old woman, non-smoker and nondiabetic, with primarily orthopedic issues in her past medical history. She was referred to the clinic today by her primary care doctor to address a left lower leg wound. The wound apparently occurred in May while she was gardening. She is uncertain exactly how it happened but perhaps struck her leg on a stick or similar protruding structure. She completed a course of both clindamycin and Augmentin based upon a wound swab culture that grew methicillin sensitive Staph aureus. She has been applying some triamcinolone cream that she had from a previous wound care admission at the [facility] wound care center. 05/30/2023: The wound measured smaller today. It is  also cleaner, with just a little bit of slough on the surface. No significant periwound erythema. 06/06/2023: The wound is smaller again today. There is minimal slough on the surface. There is some dry eschar around the wound edges. Edema control is excellent. 06/13/2023: The wound is just the slightest bit smaller today. The skin edges do appear to be starting to roll inward. Edema control is good. 06/20/2023: Once again, the wound measured just the slightest bit smaller today. There is a  little bit of slough on the surface. 06/27/2023: The wound measurements are essentially unchanged. The wound surface, however, does appear a bit more robust. 07/04/2023: No significant change to the wound dimensions. The skin edges are rolling inward. There is slough on the surface. Edema control is good. 07/11/2023: The wound measured slightly smaller today. The skin edges continue to roll inward. 07/18/2023: The wound measured smaller today. There is some slough accumulation on the surface. She is also complaining about a spot on the back of her neck. She says that it will periodically swell and then she squeezes it, extruding waxy white material. She says it has been present for several years but it bothers her when it swells up. 07/24/2023: The wound has not changed. The skin edges continue to roll inward and the surface is quite fibrotic. She does have an appointment with general surgery coming up to address her sebaceous cyst. Electronic Signature(s) Signed: 07/24/2023 9:38:09 AM By: Duanne Guess MD FACS Entered By: Duanne Guess on 07/24/2023 09:38:09 -------------------------------------------------------------------------------- Physical Exam Details Patient Name: Date of Service: Jessica Sos [name]. 07/24/2023 9:15 A M Medical Record Number: [id number] Patient Account Number: [id number] Date of Birth/Sex: Treating RN: [birth year] (82 y.o. F) Primary Care Provider: Ardean Larsen Other  Clinician: Referring Provider: Treating Provider/Extender: Juventino Slovak, Rinka Weeks in Treatment: [address], [place] [name] ([id number]) [id number] Page 3 of 9 Constitutional . . . . no acute distress. Respiratory Normal work of breathing on room air.. Notes 07/24/2023: The wound has not changed. The skin edges continue to roll inward and the surface is quite fibrotic. Electronic Signature(s) Signed: 07/24/2023 9:38:40 AM By: Duanne Guess MD FACS Entered By: Duanne Guess on 07/24/2023 09:38:40 -------------------------------------------------------------------------------- Physician Orders Details Patient Name: Date of Service: Jessica Sos [name]. 07/24/2023 9:15 A M Medical Record Number: [id number] Patient Account Number: [id number] Date of Birth/Sex: Treating RN: June 21, 1941 (82 y.o. Jessica [name] Primary Care Provider: Ardean Larsen Other Clinician: Referring Provider: Treating Provider/Extender: Alfonse Ras in Treatment: 8 The following information was scribed by: Samuella Bruin The information was scribed for: Duanne Guess Verbal / Phone Orders: No Diagnosis Coding ICD-10 Coding Code Description [id number] Non-pressure chronic ulcer of other part of left lower leg with fat layer exposed L72.3 Sebaceous cyst Follow-up Appointments ppointment in 2 weeks. - Dr. Lady Gary - room 3 Return A Nurse Visit: - 12/2 at 8:45 room 4 Anesthetic (In clinic) Topical Lidocaine 4% applied to wound bed Bathing/ Shower/ Hygiene May shower and wash wound with soap and water. - Keep Left leg wrap dry. Use a cast protector to keep left leg dry. Cast Protector can be purchased from [employer], [place], Medical supply stores etc. Cost ranges between $17-$30-if leg is wrappedf Additional Orders / Instructions Follow Nutritious Diet - Try and increase protein to 70g-100g per day. Wound Treatment Wound #1 -  Lower Leg Wound Laterality: Left Cleanser: Soap and Water 1 x Per Week/10 Days Discharge Instructions: May shower and wash wound with dial antibacterial soap and water prior to dressing change. Cleanser: Vashe 5.8 (oz) 1 x Per Week/10 Days Discharge Instructions: Cleanse the wound with Vashe prior to applying a clean dressing using gauze sponges, not tissue or cotton balls. Peri-Wound Care: Sween Lotion (Moisturizing lotion) 1 x Per Week/10 Days Discharge Instructions: As needed-Apply moisturizing lotion as directed Topical: Gentamicin 1 x Per Week/10 Days Discharge Instructions: As directed by physician Topical: Mupirocin Ointment 1 x Per Week/10 Days Discharge Instructions: Apply  Mupirocin (Bactroban) as instructed Topical: Skintegrity Hydrogel 4 (oz) 1 x Per Week/10 Days Discharge Instructions: Apply hydrogel as directed Jessica [name], Jessica [name] ([id number]) [id number] Page 4 of 9 Prim Dressing: Promogran Prisma Matrix, 4.34 (sq in) (silver collagen) 1 x Per Week/10 Days ary Discharge Instructions: Moisten collagen with saline or hydrogel Secondary Dressing: Optifoam Non-Adhesive Dressing, 4x4 in 1 x Per Week/10 Days Discharge Instructions: Apply over primary dressing as directed. Secondary Dressing: Woven Gauze Sponge, Non-Sterile 4x4 in 1 x Per Week/10 Days Discharge Instructions: Apply over primary dressing as directed. Compression Wrap: Kerlix Roll 4.5x3.1 (in/yd) 1 x Per Week/10 Days Discharge Instructions: Apply Kerlix and Coban compression as directed. Compression Wrap: Coban Self-Adherent Wrap 4x5 (in/yd) 1 x Per Week/10 Days Discharge Instructions: Apply over Kerlix as directed. Consults Vascular - referral for formal ABIs bilaterally due to nonhealing wound on left lower extremity - lower extremity arterial studies if ABIs indicate - (ICD10 L97.822 - Non-pressure chronic ulcer of other part of left lower leg with fat layer exposed) Patient  Medications llergies: Gluten Protein A Notifications Medication Indication Start End 07/24/2023 lidocaine DOSE topical 4 % cream - cream topical Electronic Signature(s) Signed: 07/24/2023 12:19:03 PM By: Duanne Guess MD FACS Entered By: Duanne Guess on 07/24/2023 09:39:01 -------------------------------------------------------------------------------- Problem List Details Patient Name: Date of Service: Jessica Sos [name]. 07/24/2023 9:15 A M Medical Record Number: [id number] Patient Account Number: [id number] Date of Birth/Sex: Treating RN: 1941-03-13 (82 y.o. F) Primary Care Provider: Ardean Larsen Other Clinician: Referring Provider: Treating Provider/Extender: Juventino Slovak, Rinka Weeks in Treatment: 8 Active Problems ICD-10 Encounter Code Description Active Date MDM Diagnosis L97.822 Non-pressure chronic ulcer of other part of left lower leg with fat layer 05/23/2023 No Yes exposed L72.3 Sebaceous cyst 07/18/2023 No Yes Inactive Problems Resolved Problems Electronic Signature(s) Signed: 07/24/2023 9:26:24 AM By: Duanne Guess MD FACS Entered By: Duanne Guess on 07/24/2023 84:69:62 [name] ([id number]) [id number] Page 5 of 9 -------------------------------------------------------------------------------- Progress Note Details Patient Name: Date of Service: Jessica [name] 07/24/2023 9:15 A M Medical Record Number: [id number] Patient Account Number: [id number] Date of Birth/Sex: Treating RN: [birth year] (82 y.o. F) Primary Care Provider: Ardean Larsen Other Clinician: Referring Provider: Treating Provider/Extender: Juventino Slovak, Rinka Weeks in Treatment: 8 Subjective Chief Complaint Information obtained from Patient Patient seen for complaints of Non-Healing Wound. History of Present Illness (HPI) ADMISSION 05/23/2023 ***PATIENT UNABLE TO TOLERATE CUFFS FOR ABI*** This  is a relatively healthy 82 year old woman, non-smoker and nondiabetic, with primarily orthopedic issues in her past medical history. She was referred to the clinic today by her primary care doctor to address a left lower leg wound. The wound apparently occurred in May while she was gardening. She is uncertain exactly how it happened but perhaps struck her leg on a stick or similar protruding structure. She completed a course of both clindamycin and Augmentin based upon a wound swab culture that grew methicillin sensitive Staph aureus. She has been applying some triamcinolone cream that she had from a previous wound care admission at the [facility] wound care center. 05/30/2023: The wound measured smaller today. It is also cleaner, with just a little bit of slough on the surface. No significant periwound erythema. 06/06/2023: The wound is smaller again today. There is minimal slough on the surface. There is some dry eschar around the wound edges. Edema control is excellent. 06/13/2023: The wound is just the slightest bit smaller today. The skin edges do appear to be starting  to roll inward. Edema control is good. 06/20/2023: Once again, the wound measured just the slightest bit smaller today. There is a little bit of slough on the surface. 06/27/2023: The wound measurements are essentially unchanged. The wound surface, however, does appear a bit more robust. 07/04/2023: No significant change to the wound dimensions. The skin edges are rolling inward. There is slough on the surface. Edema control is good. 07/11/2023: The wound measured slightly smaller today. The skin edges continue to roll inward. 07/18/2023: The wound measured smaller today. There is some slough accumulation on the surface. She is also complaining about a spot on the back of her neck. She says that it will periodically swell and then she squeezes it, extruding waxy white material. She says it has been present for several years but  it bothers her when it swells up. 07/24/2023: The wound has not changed. The skin edges continue to roll inward and the surface is quite fibrotic. She does have an appointment with general surgery coming up to address her sebaceous cyst. Patient History Information obtained from Patient. Family History Unknown History. Social History Former smoker - ended on 08/30/1975, Marital Status - Divorced, Alcohol Use - Rarely - wine, Drug Use - No History, Caffeine Use - Daily - coffee1/1/19977. Medical History Eyes Patient has history of Glaucoma Cardiovascular Patient has history of Hypertension Hospitalization/Surgery History - Left Hip Replacement. - Bilateral feet Bunion and Hammer toe surgeries. - Bilateral Carpal tunnel repair. Medical A Surgical History Notes nd Cardiovascular Hx: TIA; Coronary artery disease with angina pectoris Endocrine Hx: Hypothyroidism Musculoskeletal Hx: Scoliosis; Left foot tendon rupture Oncologic Hx: Basal cell carcinoma ( Right cheek 25 years ago) Jessica [name], Jessica [name] ([id number]) [id number] Page 6 of 9 Objective Constitutional no acute distress. Vitals Time Taken: 9:18 AM, Height: 60 in, Weight: 98 lbs, BMI: 19.1, Temperature: 97.3 F, Pulse: 65 bpm, Respiratory Rate: 18 breaths/min, Blood Pressure: 135/79 mmHg. Respiratory Normal work of breathing on room air.. General Notes: 07/24/2023: The wound has not changed. The skin edges continue to roll inward and the surface is quite fibrotic. Integumentary (Hair, Skin) Wound #1 status is Open. Original cause of wound was Trauma. The date acquired was: 12/28/2022. The wound has been in treatment 8 weeks. The wound is located on the Left Lower Leg. The wound measures 0.2cm length x 0.2cm width x 0.2cm depth; 0.031cm^2 area and 0.006cm^3 volume. There is Fat Layer (Subcutaneous Tissue) exposed. There is no tunneling or undermining noted. There is a medium amount of serous drainage  noted. The wound margin is distinct with the outline attached to the wound base. There is large (67-100%) pink, pale granulation within the wound bed. There is a small (1-33%) amount of necrotic tissue within the wound bed. The periwound skin appearance had no abnormalities noted for texture. The periwound skin appearance had no abnormalities noted for moisture. The periwound skin appearance had no abnormalities noted for color. Periwound temperature was noted as No Abnormality. Assessment Active Problems ICD-10 Non-pressure chronic ulcer of other part of left lower leg with fat layer exposed Sebaceous cyst Procedures Wound #1 Pre-procedure diagnosis of Wound #1 is a Trauma, Other located on the Left Lower Leg . There was a Excisional Skin/Subcutaneous Tissue Debridement with a total area of 0.03 sq cm performed by Duanne Guess, MD. With the following instrument(s): Curette to remove Non-Viable tissue/material. Material removed includes Subcutaneous Tissue and Slough and after achieving pain control using Lidocaine 4% T opical Solution. No specimens were taken. A time out  was conducted at 09:28, prior to the start of the procedure. A Minimum amount of bleeding was controlled with Pressure. The procedure was tolerated well. Post Debridement Measurements: 0.2cm length x 0.2cm width x 0.2cm depth; 0.006cm^3 volume. Character of Wound/Ulcer Post Debridement is improved. Post procedure Diagnosis Wound #1: Same as Pre-Procedure Plan Follow-up Appointments: Return Appointment in 2 weeks. - Dr. Lady Gary - room 3 Nurse Visit: - 12/2 at 8:45 room 4 Anesthetic: (In clinic) Topical Lidocaine 4% applied to wound bed Bathing/ Shower/ Hygiene: May shower and wash wound with soap and water. - Keep Left leg wrap dry. Use a cast protector to keep left leg dry. Cast Protector can be purchased from [employer], [place], Medical supply stores etc. Cost ranges between $17-$30-if leg is wrappedf Additional Orders  / Instructions: Follow Nutritious Diet - Try and increase protein to 70g-100g per day. Consults ordered were: Vascular - referral for formal ABIs bilaterally due to nonhealing wound on left lower extremity - lower extremity arterial studies if ABIs indicate The following medication(s) was prescribed: lidocaine topical 4 % cream cream topical was prescribed at facility WOUND #1: - Lower Leg Wound Laterality: Left Cleanser: Soap and Water 1 x Per Week/10 Days Discharge Instructions: May shower and wash wound with dial antibacterial soap and water prior to dressing change. Cleanser: Vashe 5.8 (oz) 1 x Per Week/10 Days Discharge Instructions: Cleanse the wound with Vashe prior to applying a clean dressing using gauze sponges, not tissue or cotton balls. Peri-Wound Care: Sween Lotion (Moisturizing lotion) 1 x Per Week/10 Days Discharge Instructions: As needed-Apply moisturizing lotion as directed Topical: Gentamicin 1 x Per Week/10 Days Discharge Instructions: As directed by physician Topical: Mupirocin Ointment 1 x Per Week/10 Days Jessica [name], Jessica [name] ([id number]) [id number] Page 7 of 9 Discharge Instructions: Apply Mupirocin (Bactroban) as instructed Topical: Skintegrity Hydrogel 4 (oz) 1 x Per Week/10 Days Discharge Instructions: Apply hydrogel as directed Prim Dressing: Promogran Prisma Matrix, 4.34 (sq in) (silver collagen) 1 x Per Week/10 Days ary Discharge Instructions: Moisten collagen with saline or hydrogel Secondary Dressing: Optifoam Non-Adhesive Dressing, 4x4 in 1 x Per Week/10 Days Discharge Instructions: Apply over primary dressing as directed. Secondary Dressing: Woven Gauze Sponge, Non-Sterile 4x4 in 1 x Per Week/10 Days Discharge Instructions: Apply over primary dressing as directed. Com pression Wrap: Kerlix Roll 4.5x3.1 (in/yd) 1 x Per Week/10 Days Discharge Instructions: Apply Kerlix and Coban compression as directed. Com pression Wrap: Coban  Self-Adherent Wrap 4x5 (in/yd) 1 x Per Week/10 Days Discharge Instructions: Apply over Kerlix as directed. 07/24/2023: The wound has not changed. The skin edges continue to roll inward and the surface is quite fibrotic. She does have an appointment with general surgery coming up to address her sebaceous cyst. I used a curette to debride slough, subcutaneous tissue, and skin from the wound. One of the whenever able to address were her ankle-brachial indices, and she declined having them performed when first admitted to the clinic. I am going to send her to the vascular lab to have this evaluation done with lower extremity arterial studies if the ABIs suggest they are indicated. I am also going to change her wound care protocol due to topical gentamicin, mupirocin, Prisma silver collagen moistened with hydrogel, Optifoam cover to maintain moisture and apply Kerlix and Coban wrap. She will have a nurse visit next week due to clinic scheduling and provider availability and follow-up with me in 2 weeks. Electronic Signature(s) Signed: 07/25/2023 1:35:07 PM By: Shawn Stall RN, BSN Signed: 07/25/2023 1:54:30 PM  By: Duanne Guess MD FACS Previous Signature: 07/24/2023 9:41:07 AM Version By: Duanne Guess MD FACS Entered By: Shawn Stall on 07/25/2023 13:34:09 -------------------------------------------------------------------------------- HxROS Details Patient Name: Date of Service: Jessica Sos [name]. 07/24/2023 9:15 A M Medical Record Number: [id number] Patient Account Number: [id number] Date of Birth/Sex: Treating RN: 04/24/1941 (82 y.o. F) Primary Care Provider: Ardean Larsen Other Clinician: Referring Provider: Treating Provider/Extender: Juventino Slovak, Rinka Weeks in Treatment: 8 Information Obtained From Patient Eyes Medical History: Positive for: Glaucoma Cardiovascular Medical History: Positive for: Hypertension Past Medical History Notes: Hx: TIA;  Coronary artery disease with angina pectoris Endocrine Medical History: Past Medical History Notes: Hx: Hypothyroidism Musculoskeletal Medical History: Past Medical History Notes: Hx: Scoliosis; Left foot tendon rupture Oncologic Medical History: Past Medical History NotesMarland Kitchen Jessica [name], Jessica [name] ([id number]) [id number] Page 8 of 9 Hx: Basal cell carcinoma ( Right cheek 25 years ago) HBO Extended History Items Eyes: Glaucoma Immunizations Pneumococcal Vaccine: Received Pneumococcal Vaccination: No Implantable Devices None Hospitalization / Surgery History Type of Hospitalization/Surgery Left Hip Replacement Bilateral feet Bunion and Hammer toe surgeries Bilateral Carpal tunnel repair Family and Social History Unknown History: Yes; Former smoker - ended on 08/30/1975; Marital Status - Divorced; Alcohol Use: Rarely - wine; Drug Use: No History; Caffeine Use: Daily - coffee1/1/19977; Financial Concerns: No; Food, Clothing or Shelter Needs: No; Support System Lacking: No; Transportation Concerns: No Electronic Signature(s) Signed: 07/24/2023 12:19:03 PM By: Duanne Guess MD FACS Entered By: Duanne Guess on 07/24/2023 09:38:17 -------------------------------------------------------------------------------- SuperBill Details Patient Name: Date of Service: Jessica [name] 07/24/2023 Medical Record Number: [id number] Patient Account Number: [id number] Date of Birth/Sex: Treating RN: [birth year] (82 y.o. F) Primary Care Provider: Ardean Larsen Other Clinician: Referring Provider: Treating Provider/Extender: Juventino Slovak, Rinka Weeks in Treatment: 8 Diagnosis Coding ICD-10 Codes Code Description [phone] Non-pressure chronic ulcer of other part of left lower leg with fat layer exposed L72.3 Sebaceous cyst Facility Procedures : CPT4 Code: [id number] Description: 11042 - DEB SUBQ TISSUE 20 SQ CM/< ICD-10 Diagnosis Description  L97.822 Non-pressure chronic ulcer of other part of left lower leg with fat layer expo Modifier: sed Quantity: 1 Physician Procedures : CPT4 Code Description Modifier [id number] [id number] - WC PHYS LEVEL 4 - EST PT 25 ICD-10 Diagnosis Description L97.822 Non-pressure chronic ulcer of other part of left lower leg with fat layer exposed Quantity: 1 : [id number] 11042 - WC PHYS SUBQ TISS 20 SQ CM ICD-10 Diagnosis Description L97.822 Non-pressure chronic ulcer of other part of left lower leg with fat layer exposed Jessica [name], Jessica [name] ([id number]) [phone].pdf Quantity: 1 Page 9 of 9 Electronic Signature(s) Signed: 07/24/2023 9:41:20 AM By: Duanne Guess MD FACS Entered By: Duanne Guess on 07/24/2023 09:41:20

## 2023-07-31 ENCOUNTER — Encounter (HOSPITAL_BASED_OUTPATIENT_CLINIC_OR_DEPARTMENT_OTHER): Payer: Medicare PPO | Admitting: Internal Medicine

## 2023-07-31 ENCOUNTER — Encounter (HOSPITAL_BASED_OUTPATIENT_CLINIC_OR_DEPARTMENT_OTHER): Payer: Self-pay

## 2023-08-01 DIAGNOSIS — L723 Sebaceous cyst: Secondary | ICD-10-CM | POA: Diagnosis not present

## 2023-08-07 ENCOUNTER — Ambulatory Visit (HOSPITAL_BASED_OUTPATIENT_CLINIC_OR_DEPARTMENT_OTHER): Payer: Medicare PPO | Admitting: Internal Medicine

## 2023-08-07 DIAGNOSIS — S81802A Unspecified open wound, left lower leg, initial encounter: Secondary | ICD-10-CM | POA: Diagnosis not present

## 2023-08-07 DIAGNOSIS — S81809A Unspecified open wound, unspecified lower leg, initial encounter: Secondary | ICD-10-CM | POA: Diagnosis not present

## 2023-08-09 DIAGNOSIS — I10 Essential (primary) hypertension: Secondary | ICD-10-CM | POA: Diagnosis not present

## 2023-08-09 DIAGNOSIS — R7303 Prediabetes: Secondary | ICD-10-CM | POA: Diagnosis not present

## 2023-08-09 DIAGNOSIS — E038 Other specified hypothyroidism: Secondary | ICD-10-CM | POA: Diagnosis not present

## 2023-08-09 DIAGNOSIS — I700 Atherosclerosis of aorta: Secondary | ICD-10-CM | POA: Diagnosis not present

## 2023-08-09 DIAGNOSIS — Z0000 Encounter for general adult medical examination without abnormal findings: Secondary | ICD-10-CM | POA: Diagnosis not present

## 2023-08-09 DIAGNOSIS — R269 Unspecified abnormalities of gait and mobility: Secondary | ICD-10-CM | POA: Diagnosis not present

## 2023-08-09 DIAGNOSIS — Z8673 Personal history of transient ischemic attack (TIA), and cerebral infarction without residual deficits: Secondary | ICD-10-CM | POA: Diagnosis not present

## 2023-08-09 DIAGNOSIS — I2510 Atherosclerotic heart disease of native coronary artery without angina pectoris: Secondary | ICD-10-CM | POA: Diagnosis not present

## 2023-08-09 DIAGNOSIS — E782 Mixed hyperlipidemia: Secondary | ICD-10-CM | POA: Diagnosis not present

## 2023-08-14 ENCOUNTER — Ambulatory Visit (HOSPITAL_COMMUNITY)
Admission: RE | Admit: 2023-08-14 | Discharge: 2023-08-14 | Disposition: A | Discharge status: Home / Self Care | Payer: Medicare PPO | Source: Ambulatory Visit | Attending: Surgery | Admitting: Surgery

## 2023-08-14 ENCOUNTER — Other Ambulatory Visit (HOSPITAL_COMMUNITY): Payer: Self-pay | Admitting: General Surgery

## 2023-08-14 DIAGNOSIS — L97822 Non-pressure chronic ulcer of other part of left lower leg with fat layer exposed: Secondary | ICD-10-CM | POA: Diagnosis not present

## 2023-08-14 DIAGNOSIS — R7303 Prediabetes: Secondary | ICD-10-CM | POA: Diagnosis not present

## 2023-08-14 DIAGNOSIS — I10 Essential (primary) hypertension: Secondary | ICD-10-CM | POA: Diagnosis not present

## 2023-08-14 LAB — VAS US ABI WITH/WO TBI
Left ABI: 1.16
Right ABI: 1.09

## 2023-08-15 DIAGNOSIS — H401212 Low-tension glaucoma, right eye, moderate stage: Secondary | ICD-10-CM | POA: Diagnosis not present

## 2023-08-15 DIAGNOSIS — H4720 Unspecified optic atrophy: Secondary | ICD-10-CM | POA: Diagnosis not present

## 2023-08-15 DIAGNOSIS — H5213 Myopia, bilateral: Secondary | ICD-10-CM | POA: Diagnosis not present

## 2023-08-17 ENCOUNTER — Encounter (HOSPITAL_BASED_OUTPATIENT_CLINIC_OR_DEPARTMENT_OTHER): Payer: Medicare PPO | Attending: General Surgery | Admitting: General Surgery

## 2023-08-17 DIAGNOSIS — S81802A Unspecified open wound, left lower leg, initial encounter: Secondary | ICD-10-CM | POA: Diagnosis not present

## 2023-08-17 DIAGNOSIS — L97822 Non-pressure chronic ulcer of other part of left lower leg with fat layer exposed: Secondary | ICD-10-CM | POA: Insufficient documentation

## 2023-08-17 DIAGNOSIS — L723 Sebaceous cyst: Secondary | ICD-10-CM | POA: Diagnosis not present

## 2023-08-19 NOTE — Progress Notes (Signed)
Jessica [name], Jessica [name] ([id number]) [id number] Page 1 of 7 Visit Report for 08/17/2023 Arrival Information Details Patient Name: Date of Service: Jessica [name] 08/17/2023 7:30 A M Medical Record Number: [id number] Patient Account Number: [id number] Date of Birth/Sex: Treating RN: 21-Apr-1941 (82 y.o. Tommye Standard Primary Care Shelby Anderle: Ardean Larsen Other Clinician: Referring Pinchas Reither: Treating Deklyn Trachtenberg/Extender: Alfonse Ras in Treatment: 12 Visit Information History Since Last Visit Added or deleted any medications: No Patient Arrived: Ambulatory Any new allergies or adverse reactions: No Arrival Time: 07:41 Had a fall or experienced change in No Accompanied By: self activities of daily living that may affect Transfer Assistance: None risk of falls: Patient Identification Verified: Yes Signs or symptoms of abuse/neglect since last visito No Secondary Verification Process Completed: Yes Hospitalized since last visit: No Patient Requires Transmission-Based Precautions: No Implantable device outside of the clinic excluding No Patient Has Alerts: Yes cellular tissue based products placed in the center Patient Alerts: Unable to complete ABI L since last visit: Has Dressing in Place as Prescribed: Yes Has Compression in Place as Prescribed: Yes Pain Present Now: No Electronic Signature(s) Signed: 08/18/2023 12:52:17 PM By: Zenaida Deed RN, BSN Entered By: Zenaida Deed on 08/17/2023 04:43:47 -------------------------------------------------------------------------------- Encounter Discharge Information Details Patient Name: Date of Service: Jessica Sos [name]. 08/17/2023 7:30 A M Medical Record Number: [id number] Patient Account Number: [id number] Date of Birth/Sex: Treating RN: Jul 24, 1941 (82 y.o. Tommye Standard Primary Care Karlos Scadden: Ardean Larsen Other Clinician: Referring Kenniya Westrich: Treating  Mikaelyn Arthurs/Extender: Alfonse Ras in Treatment: 12 Encounter Discharge Information Items Post Procedure Vitals Discharge Condition: Stable Temperature (F): 97.8 Ambulatory Status: Cane Pulse (bpm): 64 Discharge Destination: Home Respiratory Rate (breaths/min): 18 Transportation: Private Auto Blood Pressure (mmHg): 134/70 Accompanied By: self Schedule Follow-up Appointment: Yes Clinical Summary of Care: Patient Declined Electronic Signature(s) Signed: 08/18/2023 12:52:17 PM By: Zenaida Deed RN, BSN Entered By: Zenaida Deed on 08/17/2023 05:20:58 Jessica [name] ([id number]) [id number] Page 2 of 7 -------------------------------------------------------------------------------- Lower Extremity Assessment Details Patient Name: Date of Service: Jessica [name] 08/17/2023 7:30 A M Medical Record Number: [id number] Patient Account Number: [id number] Date of Birth/Sex: Treating RN: 03-21-1941 (82 y.o. Tommye Standard Primary Care Alfa Leibensperger: Ardean Larsen Other Clinician: Referring Zaron Zwiefelhofer: Treating Shubh Chiara/Extender: Juventino Slovak, Rinka Weeks in Treatment: 12 Edema Assessment Assessed: [Left: No] [Right: No] Edema: [Left: N] [Right: o] Calf Left: Right: Point of Measurement: 30 cm From Medial Instep 28.5 cm Ankle Left: Right: Point of Measurement: 9 cm From Medial Instep 17.5 cm Vascular Assessment Extremity colors, hair growth, and conditions: Extremity Color: [Left:Normal] Hair Growth on Extremity: [Left:No] Temperature of Extremity: [Left:Warm] Capillary Refill: [Left:< 3 seconds] Dependent Rubor: [Left:No No] Electronic Signature(s) Signed: 08/18/2023 12:52:17 PM By: Zenaida Deed RN, BSN Entered By: Zenaida Deed on 08/17/2023 04:48:35 -------------------------------------------------------------------------------- Multi Wound Chart Details Patient Name: Date of Service: Jessica Sos [name]. 08/17/2023 7:30 A M Medical Record Number: [id number] Patient Account Number: [id number] Date of Birth/Sex: Treating RN: November 06, 1940 (82 y.o. F) Primary Care Emrys Mceachron: Ardean Larsen Other Clinician: Referring Johnmichael Melhorn: Treating Sherwood Castilla/Extender: Juventino Slovak, Rinka Weeks in Treatment: 12 Vital Signs Height(in): 60 Pulse(bpm): 64 Weight(lbs): 98 Blood Pressure(mmHg): 134/70 Body Mass Index(BMI): 19.1 Temperature(F): 97.8 Respiratory Rate(breaths/min): 18 [1:Photos:] [N/A:N/A] Left Lower Leg N/A N/A Wound Location: Trauma N/A N/A Wounding Event: Trauma, Other N/A N/A Primary Etiology: Glaucoma, Hypertension N/A N/A Comorbid History: 12/28/2022 N/A N/A Date Acquired: 91  N/A N/A Weeks of Treatment: Open N/A N/A Wound Status: No N/A N/A Wound Recurrence: 0.6x0.4x0.1 N/A N/A Measurements L x W x D (cm) 0.188 N/A N/A A (cm) : rea 0.019 N/A N/A Volume (cm) : 82.30% N/A N/A % Reduction in A rea: 82.10% N/A N/A % Reduction in Volume: Full Thickness Without Exposed N/A N/A Classification: Support Structures Small N/A N/A Exudate A mount: Serous N/A N/A Exudate Type: amber N/A N/A Exudate Color: Epibole N/A N/A Wound Margin: Large (67-100%) N/A N/A Granulation A mount: Pink N/A N/A Granulation Quality: Small (1-33%) N/A N/A Necrotic A mount: Fat Layer (Subcutaneous Tissue): Yes N/A N/A Exposed Structures: Fascia: No Tendon: No Muscle: No Joint: No Bone: No None N/A N/A Epithelialization: Debridement - Excisional N/A N/A Debridement: Pre-procedure Verification/Time Out 08:05 N/A N/A Taken: Lidocaine 4% Topical Solution N/A N/A Pain Control: Subcutaneous, Slough N/A N/A Tissue Debrided: Skin/Subcutaneous Tissue N/A N/A Level: 0.19 N/A N/A Debridement A (sq cm): rea Curette N/A N/A Instrument: Minimum N/A N/A Bleeding: Pressure N/A N/A Hemostasis A chieved: 2 N/A N/A Procedural Pain: 1 N/A N/A Post Procedural  Pain: Procedure was tolerated well N/A N/A Debridement Treatment Response: 0.6x0.4x0.1 N/A N/A Post Debridement Measurements L x W x D (cm) 0.019 N/A N/A Post Debridement Volume: (cm) No Abnormalities Noted N/A N/A Periwound Skin Texture: Maceration: No N/A N/A Periwound Skin Moisture: Dry/Scaly: No No Abnormalities Noted N/A N/A Periwound Skin Color: No Abnormality N/A N/A Temperature: Debridement N/A N/A Procedures Performed: Treatment Notes Electronic Signature(s) Signed: 08/17/2023 8:41:18 AM By: Duanne Guess MD FACS Entered By: Duanne Guess on 08/17/2023 05:17:08 -------------------------------------------------------------------------------- Multi-Disciplinary Care Plan Details Patient Name: Date of Service: Jessica Sos [name]. 08/17/2023 7:30 A M Medical Record Number: [id number] Patient Account Number: [id number] Date of Birth/Sex: Treating RN: 1941/03/23 (82 y.o. Tommye Standard Primary Care Jaron Czarnecki: Ardean Larsen Other Clinician: Referring Kery Batzel: Treating Herminio Kniskern/Extender: Alfonse Ras in Treatment: [address], [place] [name] ([id number]) [id number] Page 4 of 7 Multidisciplinary Care Plan reviewed with physician Active Inactive Wound/Skin Impairment Nursing Diagnoses: Impaired tissue integrity Goals: Patient/caregiver will verbalize understanding of skin care regimen Date Initiated: 05/23/2023 Target Resolution Date: 08/23/2023 Goal Status: Active Interventions: Assess ulceration(s) every visit Treatment Activities: Skin care regimen initiated : 05/23/2023 Notes: Electronic Signature(s) Signed: 08/18/2023 12:52:17 PM By: Zenaida Deed RN, BSN Entered By: Zenaida Deed on 08/17/2023 04:54:27 -------------------------------------------------------------------------------- Pain Assessment Details Patient Name: Date of Service: Jessica Sos [name]. 08/17/2023 7:30 A M Medical  Record Number: [id number] Patient Account Number: [id number] Date of Birth/Sex: Treating RN: Apr 19, 1941 (82 y.o. Tommye Standard Primary Care Michaeljames Milnes: Ardean Larsen Other Clinician: Referring Shereta Crothers: Treating Durante Violett/Extender: Alfonse Ras in Treatment: 12 Active Problems Location of Pain Severity and Description of Pain Patient Has Paino No Site Locations Rate the pain. Current Pain Level: 0 Pain Management and Medication Current Pain Management: Electronic Signature(s) Signed: 08/18/2023 12:52:17 PM By: Zenaida Deed RN, BSN Entered By: Zenaida Deed on 08/17/2023 04:44:36 Jessica [name] ([id number]) [id number] Page 5 of 7 -------------------------------------------------------------------------------- Patient/Caregiver Education Details Patient Name: Date of Service: Jessica [name] 12/19/2024andnbsp7:30 A M Medical Record Number: [id number] Patient Account Number: [id number] Date of Birth/Gender: Treating RN: [birth year] (82 y.o. Tommye Standard Primary Care Physician: Ardean Larsen Other Clinician: Referring Physician: Treating Physician/Extender: Alfonse Ras in Treatment: 12 Education Assessment Education Provided To: Patient Education Topics Provided Wound/Skin Impairment: Methods: Explain/Verbal Responses: Reinforcements needed, State content correctly  Electronic Signature(s) Signed: 08/18/2023 12:52:17 PM By: Zenaida Deed RN, BSN Entered By: Zenaida Deed on 08/17/2023 04:55:25 -------------------------------------------------------------------------------- Wound Assessment Details Patient Name: Date of Service: Jessica Sos [name]. 08/17/2023 7:30 A M Medical Record Number: [id number] Patient Account Number: [id number] Date of Birth/Sex: Treating RN: Jan 05, 1941 (82 y.o. Tommye Standard Primary Care Myanna Ziesmer: Ardean Larsen Other  Clinician: Referring Starlyn Droge: Treating Tarus Briski/Extender: Juventino Slovak, Rinka Weeks in Treatment: 12 Wound Status Wound Number: 1 Primary Etiology: Trauma, Other Wound Location: Left Lower Leg Wound Status: Open Wounding Event: Trauma Comorbid History: Glaucoma, Hypertension Date Acquired: 12/28/2022 Weeks Of Treatment: 12 Clustered Wound: No Photos Wound Measurements Length: (cm) 0.6 [place], Havilah [name] ([id number]) Width: (cm) Depth: (cm) Area: (cm) Volume: (cm) % Reduction in Area: 82.3% [id number] Page 6 of 7 0.4 % Reduction in Volume: 82.1% 0.1 Epithelialization: None 0.188 Tunneling: No 0.019 Undermining: No Wound Description Classification: Full Thickness Without Exposed Support Structures Wound Margin: Epibole Exudate Amount: Small Exudate Type: Serous Exudate Color: amber Foul Odor After Cleansing: No Slough/Fibrino Yes Wound Bed Granulation Amount: Large (67-100%) Exposed Structure Granulation Quality: Pink Fascia Exposed: No Necrotic Amount: Small (1-33%) Fat Layer (Subcutaneous Tissue) Exposed: Yes Necrotic Quality: Adherent Slough Tendon Exposed: No Muscle Exposed: No Joint Exposed: No Bone Exposed: No Periwound Skin Texture Texture Color No Abnormalities Noted: Yes No Abnormalities Noted: Yes Moisture Temperature / Pain No Abnormalities Noted: Yes Temperature: No Abnormality Treatment Notes Wound #1 (Lower Leg) Wound Laterality: Left Cleanser Soap and Water Discharge Instruction: May shower and wash wound with dial antibacterial soap and water prior to dressing change. Vashe 5.8 (oz) Discharge Instruction: Cleanse the wound with Vashe prior to applying a clean dressing using gauze sponges, not tissue or cotton balls. Peri-Wound Care Sween Lotion (Moisturizing lotion) Discharge Instruction: As needed-Apply moisturizing lotion as directed Topical Gentamicin Discharge Instruction: As directed by  physician Mupirocin Ointment Discharge Instruction: Apply Mupirocin (Bactroban) as instructed Skintegrity Hydrogel 4 (oz) Discharge Instruction: Apply hydrogel as directed Primary Dressing Promogran Prisma Matrix, 4.34 (sq in) (silver collagen) Discharge Instruction: Moisten collagen with saline or hydrogel Secondary Dressing Zetuvit Plus Silicone Border Dressing 3x3 (in/in) Discharge Instruction: Apply silicone border over primary dressing as directed. Secured With Compression Wrap Compression Stockings Facilities manager) Signed: 08/18/2023 12:52:17 PM By: Zenaida Deed RN, BSN Entered By: Zenaida Deed on 08/17/2023 04:52:38 Jessica [name] ([id number]) [id number] Page 7 of 7 -------------------------------------------------------------------------------- Vitals Details Patient Name: Date of Service: Jessica [name] 08/17/2023 7:30 A M Medical Record Number: [id number] Patient Account Number: [id number] Date of Birth/Sex: Treating RN: 1940-11-14 (82 y.o. Tommye Standard Primary Care Ethel Veronica: Ardean Larsen Other Clinician: Referring Ryu Cerreta: Treating Resha Filippone/Extender: Juventino Slovak, Rinka Weeks in Treatment: 12 Vital Signs Time Taken: 07:43 Temperature (F): 97.8 Height (in): 60 Pulse (bpm): 64 Weight (lbs): 98 Respiratory Rate (breaths/min): 18 Body Mass Index (BMI): 19.1 Blood Pressure (mmHg): 134/70 Reference Range: 80 - 120 mg / dl Electronic Signature(s) Signed: 08/18/2023 12:52:17 PM By: Zenaida Deed RN, BSN Entered By: Zenaida Deed on 08/17/2023 04:44:22

## 2023-08-19 NOTE — Progress Notes (Signed)
FASHIONETTE, DEGRACE ([id number]) [id number] Page 1 of 7 Visit Report for 08/17/2023 Chief Complaint Document Details Patient Name: Date of Service: Jessica [name] 08/17/2023 7:30 A M Medical Record Number: [id number] Patient Account Number: [id number] Date of Birth/Sex: Treating RN: [birth year] (82 y.o. F) Primary Care Provider: Ardean Larsen Other Clinician: Referring Provider: Treating Provider/Extender: Juventino Slovak, Rinka Weeks in Treatment: 12 Information Obtained from: Patient Chief Complaint Patient seen for complaints of Non-Healing Wound. Electronic Signature(s) Signed: 08/17/2023 8:41:18 AM By: Duanne Guess MD FACS Entered By: Duanne Guess on 08/17/2023 05:17:14 -------------------------------------------------------------------------------- Debridement Details Patient Name: Date of Service: Jessica Sos [name]. 08/17/2023 7:30 A M Medical Record Number: [id number] Patient Account Number: [id number] Date of Birth/Sex: Treating RN: 02/10/1941 (82 y.o. Tommye Standard Primary Care Provider: Ardean Larsen Other Clinician: Referring Provider: Treating Provider/Extender: Alfonse Ras in Treatment: 12 Debridement Performed for Assessment: Wound #1 Left Lower Leg Performed By: Physician Duanne Guess, MD The following information was scribed by: Zenaida Deed The information was scribed for: Duanne Guess Debridement Type: Debridement Level of Consciousness (Pre-procedure): Awake and Alert Pre-procedure Verification/Time Out Yes - 08:05 Taken: Start Time: 08:07 Pain Control: Lidocaine 4% T opical Solution Percent of Wound Bed Debrided: 100% T Area Debrided (cm): otal 0.19 Tissue and other material debrided: Viable, Non-Viable, Slough, Subcutaneous, Slough Level: Skin/Subcutaneous Tissue Debridement Description: Excisional Instrument: Curette Bleeding:  Minimum Hemostasis Achieved: Pressure Procedural Pain: 2 Post Procedural Pain: 1 Response to Treatment: Procedure was tolerated well Level of Consciousness (Post- Awake and Alert procedure): Post Debridement Measurements of Total Wound Length: (cm) 0.6 Width: (cm) 0.4 Depth: (cm) 0.1 Volume: (cm) 0.019 Character of Wound/Ulcer Post Debridement: Improved Jessica [name], Jessica [name] ([id number]) [id number] Page 2 of 7 Post Procedure Diagnosis Same as Pre-procedure Electronic Signature(s) Signed: 08/17/2023 8:41:18 AM By: Duanne Guess MD FACS Signed: 08/18/2023 12:52:17 PM By: Zenaida Deed RN, BSN Entered By: Zenaida Deed on 08/17/2023 05:10:32 -------------------------------------------------------------------------------- HPI Details Patient Name: Date of Service: Jessica Sos [name]. 08/17/2023 7:30 A M Medical Record Number: [id number] Patient Account Number: [id number] Date of Birth/Sex: Treating RN: 09-Mar-1941 (82 y.o. F) Primary Care Provider: Ardean Larsen Other Clinician: Referring Provider: Treating Provider/Extender: Juventino Slovak, Rinka Weeks in Treatment: 12 History of Present Illness HPI Description: ADMISSION 05/23/2023 ***PATIENT UNABLE TO TOLERATE CUFFS FOR ABI*** This is a relatively healthy 82 year old woman, non-smoker and nondiabetic, with primarily orthopedic issues in her past medical history. She was referred to the clinic today by her primary care doctor to address a left lower leg wound. The wound apparently occurred in May while she was gardening. She is uncertain exactly how it happened but perhaps struck her leg on a stick or similar protruding structure. She completed a course of both clindamycin and Augmentin based upon a wound swab culture that grew methicillin sensitive Staph aureus. She has been applying some triamcinolone cream that she had from a previous wound care admission at the [facility] wound  care center. 05/30/2023: The wound measured smaller today. It is also cleaner, with just a little bit of slough on the surface. No significant periwound erythema. 06/06/2023: The wound is smaller again today. There is minimal slough on the surface. There is some dry eschar around the wound edges. Edema control is excellent. 06/13/2023: The wound is just the slightest bit smaller today. The skin edges do appear to be starting to roll inward. Edema control is good. 06/20/2023: Once again, the wound  measured just the slightest bit smaller today. There is a little bit of slough on the surface. 06/27/2023: The wound measurements are essentially unchanged. The wound surface, however, does appear a bit more robust. 07/04/2023: No significant change to the wound dimensions. The skin edges are rolling inward. There is slough on the surface. Edema control is good. 07/11/2023: The wound measured slightly smaller today. The skin edges continue to roll inward. 07/18/2023: The wound measured smaller today. There is some slough accumulation on the surface. She is also complaining about a spot on the back of her neck. She says that it will periodically swell and then she squeezes it, extruding waxy white material. She says it has been present for several years but it bothers her when it swells up. 07/24/2023: The wound has not changed. The skin edges continue to roll inward and the surface is quite fibrotic. She does have an appointment with general surgery coming up to address her sebaceous cyst. 08/17/2023: The wound is about the same. The skin edges are scrolled, but perhaps not as much as in the past. There is slough on the wound surface. She indicated today that she declined to permit me to address the skin edges when I debrided, but would allow me to debride the slough. She also declines any sort of compressive wrap. Electronic Signature(s) Signed: 08/17/2023 8:41:18 AM By: Duanne Guess MD FACS Entered By:  Duanne Guess on 08/17/2023 05:19:01 -------------------------------------------------------------------------------- Physical Exam Details Patient Name: Date of Service: Jessica Sos [name]. 08/17/2023 7:30 A M Medical Record Number: [id number] Patient Account Number: [id number] Jessica [name], Jessica [name] ([id number]) [id number] Page 3 of 7 Date of Birth/Sex: Treating RN: September 25, 1940 (82 y.o. F) Primary Care Provider: Other Clinician: Ardean Larsen Referring Provider: Treating Provider/Extender: Juventino Slovak, Rinka Weeks in Treatment: 12 Constitutional . . . . no acute distress. Respiratory Normal work of breathing on room air.. Notes 08/17/2023: The wound is about the same. The skin edges are scrolled, but perhaps not as much as in the past. There is slough on the wound surface. Electronic Signature(s) Signed: 08/17/2023 8:41:18 AM By: Duanne Guess MD FACS Entered By: Duanne Guess on 08/17/2023 05:19:30 -------------------------------------------------------------------------------- Physician Orders Details Patient Name: Date of Service: Jessica Sos [name]. 08/17/2023 7:30 A M Medical Record Number: [id number] Patient Account Number: [id number] Date of Birth/Sex: Treating RN: 27-Aug-1941 (82 y.o. Tommye Standard Primary Care Provider: Ardean Larsen Other Clinician: Referring Provider: Treating Provider/Extender: Alfonse Ras in Treatment: 12 The following information was scribed by: Zenaida Deed The information was scribed for: Duanne Guess Verbal / Phone Orders: No Diagnosis Coding ICD-10 Coding Code Description [phone] Non-pressure chronic ulcer of other part of left lower leg with fat layer exposed Follow-up Appointments ppointment in 1 week. - Dr. Lady Gary Return A 12/26 @ 08:30 am Anesthetic (In clinic) Topical Lidocaine 4% applied to wound bed Bathing/ Shower/ Hygiene May  shower and wash wound with soap and water. Additional Orders / Instructions Follow Nutritious Diet - Try and increase protein to 70g-100g per day. Wound Treatment Wound #1 - Lower Leg Wound Laterality: Left Cleanser: Soap and Water Every Other Day/30 Days Discharge Instructions: May shower and wash wound with dial antibacterial soap and water prior to dressing change. Cleanser: Vashe 5.8 (oz) Every Other Day/30 Days Discharge Instructions: Cleanse the wound with Vashe prior to applying a clean dressing using gauze sponges, not tissue or cotton balls. Peri-Wound Care: Sween Lotion (Moisturizing lotion) Every Other Day/30  Days Discharge Instructions: As needed-Apply moisturizing lotion as directed Topical: Skintegrity Hydrogel 4 (oz) Every Other Day/30 Days Discharge Instructions: Apply hydrogel as directed Prim Dressing: Promogran Prisma Matrix, 4.34 (sq in) (silver collagen) Every Other Day/30 Days ary Discharge Instructions: Moisten collagen with saline or hydrogel Jessica [name], Jessica [name] ([id number]) [id number] Page 4 of 7 Secondary Dressing: Zetuvit Plus Silicone Border Dressing 3x3 (in/in) Every Other Day/30 Days Discharge Instructions: Apply silicone border over primary dressing as directed. Electronic Signature(s) Signed: 08/17/2023 8:41:18 AM By: Duanne Guess MD FACS Entered By: Duanne Guess on 08/17/2023 05:25:05 -------------------------------------------------------------------------------- Problem List Details Patient Name: Date of Service: Jessica Sos [name]. 08/17/2023 7:30 A M Medical Record Number: [id number] Patient Account Number: [id number] Date of Birth/Sex: Treating RN: 12/10/1940 (82 y.o. 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 08/17/2023 7:30 A M Medical Record Number: [id number] Patient Account Number: [id number] Date of Birth/Sex: Treating RN: [birth year] (82 y.o. F) Primary Care Provider: Ardean Larsen Other Clinician: Referring Provider: Treating Provider/Extender: Juventino Slovak, Rinka Weeks in Treatment: 12 Subjective Chief Complaint Information obtained from Patient Patient seen for complaints of Non-Healing Wound. Jessica [name], Jessica [name] ([id number]) [id number] Page 5 of 7 History of Present Illness (HPI) ADMISSION 05/23/2023 ***PATIENT UNABLE TO TOLERATE CUFFS FOR ABI*** This is a relatively healthy 82 year old woman, non-smoker and nondiabetic, with primarily orthopedic issues in her past medical history. She was referred to the clinic today by her primary care doctor to address a left lower leg wound. The wound apparently occurred in May while she was gardening. She is uncertain exactly how it happened but perhaps struck her leg on a stick or similar protruding structure. She completed a course of both clindamycin and Augmentin based upon a wound swab culture that grew methicillin sensitive Staph aureus. She has been applying some triamcinolone cream that she had from a previous wound care admission at the [facility] wound care center. 05/30/2023: The wound measured smaller today. It is also cleaner, with just a little bit of  slough on the surface. No significant periwound erythema. 06/06/2023: The wound is smaller again today. There is minimal slough on the surface. There is some dry eschar around the wound edges. Edema control is excellent. 06/13/2023: The wound is just the slightest bit smaller today. The skin edges do appear to be starting to roll inward. Edema control is good. 06/20/2023: Once again, the wound measured just the slightest bit smaller today. There is a little bit of slough on the surface. 06/27/2023: The wound measurements are essentially unchanged. The wound surface, however, does appear a bit more robust. 07/04/2023: No significant change to the wound dimensions. The skin edges are rolling inward. There is slough on the surface. Edema control is good. 07/11/2023: The wound measured slightly smaller today. The skin edges continue to roll inward. 07/18/2023: The wound measured smaller today. There is some slough accumulation on the surface. She is also complaining about a spot on the back of her  neck. She says that it will periodically swell and then she squeezes it, extruding waxy white material. She says it has been present for several years but it bothers her when it swells up. 07/24/2023: The wound has not changed. The skin edges continue to roll inward and the surface is quite fibrotic. She does have an appointment with general surgery coming up to address her sebaceous cyst. 08/17/2023: The wound is about the same. The skin edges are scrolled, but perhaps not as much as in the past. There is slough on the wound surface. She indicated today that she declined to permit me to address the skin edges when I debrided, but would allow me to debride the slough. She also declines any sort of compressive wrap. Objective Constitutional no acute distress. Vitals Time Taken: 7:43 AM, Height: 60 in, Weight: 98 lbs, BMI: 19.1, Temperature: 97.8 F, Pulse: 64 bpm, Respiratory Rate: 18 breaths/min, Blood  Pressure: 134/70 mmHg. Respiratory Normal work of breathing on room air.. General Notes: 08/17/2023: The wound is about the same. The skin edges are scrolled, but perhaps not as much as in the past. There is slough on the wound surface. Integumentary (Hair, Skin) Wound #1 status is Open. Original cause of wound was Trauma. The date acquired was: 12/28/2022. The wound has been in treatment 12 weeks. The wound is located on the Left Lower Leg. The wound measures 0.6cm length x 0.4cm width x 0.1cm depth; 0.188cm^2 area and 0.019cm^3 volume. There is Fat Layer (Subcutaneous Tissue) exposed. There is no tunneling or undermining noted. There is a small amount of serous drainage noted. The wound margin is epibole. There is large (67-100%) pink granulation within the wound bed. There is a small (1-33%) amount of necrotic tissue within the wound bed including Adherent Slough. The periwound skin appearance had no abnormalities noted for texture. The periwound skin appearance had no abnormalities noted for moisture. The periwound skin appearance had no abnormalities noted for color. Periwound temperature was noted as No Abnormality. Assessment Active Problems ICD-10 Non-pressure chronic ulcer of other part of left lower leg with fat layer exposed Procedures Wound #1 Pre-procedure diagnosis of Wound #1 is a Trauma, Other located on the Left Lower Leg . There was a Excisional Skin/Subcutaneous Tissue Debridement with a Jessica [name], Jessica [name] ([id number]) [id number] Page 6 of 7 total area of 0.19 sq cm performed by Duanne Guess, MD. With the following instrument(s): Curette to remove Viable and Non-Viable tissue/material. Material removed includes Subcutaneous Tissue and Slough and after achieving pain control using Lidocaine 4% T opical Solution. No specimens were taken. A time out was conducted at 08:05, prior to the start of the procedure. A Minimum amount of bleeding was  controlled with Pressure. The procedure was tolerated well with a pain level of 2 throughout and a pain level of 1 following the procedure. Post Debridement Measurements: 0.6cm length x 0.4cm width x 0.1cm depth; 0.019cm^3 volume. Character of Wound/Ulcer Post Debridement is improved. Post procedure Diagnosis Wound #1: Same as Pre-Procedure Plan Follow-up Appointments: Return Appointment in 1 week. - Dr. Lady Gary 12/26 @ 08:30 am Anesthetic: (In clinic) Topical Lidocaine 4% applied to wound bed Bathing/ Shower/ Hygiene: May shower and wash wound with soap and water. Additional Orders / Instructions: Follow Nutritious Diet - Try and increase protein to 70g-100g per day. WOUND #1: - Lower Leg Wound Laterality: Left Cleanser: Soap and Water Every Other Day/30 Days Discharge Instructions: May shower and wash wound with dial antibacterial soap and water prior to dressing  change. Cleanser: Vashe 5.8 (oz) Every Other Day/30 Days Discharge Instructions: Cleanse the wound with Vashe prior to applying a clean dressing using gauze sponges, not tissue or cotton balls. Peri-Wound Care: Sween Lotion (Moisturizing lotion) Every Other Day/30 Days Discharge Instructions: As needed-Apply moisturizing lotion as directed Topical: Skintegrity Hydrogel 4 (oz) Every Other Day/30 Days Discharge Instructions: Apply hydrogel as directed Prim Dressing: Promogran Prisma Matrix, 4.34 (sq in) (silver collagen) Every Other Day/30 Days ary Discharge Instructions: Moisten collagen with saline or hydrogel Secondary Dressing: Zetuvit Plus Silicone Border Dressing 3x3 (in/in) Every Other Day/30 Days Discharge Instructions: Apply silicone border over primary dressing as directed. 08/17/2023: The wound is about the same. The skin edges are scrolled, but perhaps not as much as in the past. There is slough on the wound surface. She indicated today that she declined to permit me to address the skin edges when I debrided, but  would allow me to debride the slough. She also declines any sort of compressive wrap. 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 I used a curette to debride slough and subcutaneous tissue from her wound. She said that she would like to change her dressings on her own at home. We will continue with Prisma silver collagen and a foam border dressing. I do not think she requires any topical antibiotics at this time. Follow-up in 1 week. Electronic Signature(s) Signed: 08/17/2023 8:41:18 AM By: Duanne Guess MD FACS Entered By: Duanne Guess on 08/17/2023 05:26:36 -------------------------------------------------------------------------------- SuperBill Details Patient Name: Date of Service: Jessica [name] 08/17/2023 Medical Record Number: [id number] Patient Account Number: [id number] Date of Birth/Sex: Treating RN: 1941/05/07 (82 y.o. F) Primary Care Provider: Ardean Larsen Other Clinician: Referring Provider: Treating Provider/Extender: Juventino Slovak, Rinka Weeks in Treatment: 12 Diagnosis Coding ICD-10 Codes Code Description [phone] Non-pressure chronic ulcer of other part of left lower leg with fat layer exposed Facility Procedures KALESHIA, MUELLER ([id number]): CPT4 Code Description [id number] 11042 - DEB SUBQ TISSUE 20 SQ CM/< ICD-10 Diagnosis Description L97.822 Non-pressure chronic ulcer of other part of left lower leg with fa [id number] Page 7 of 7: Modifier Quantity 1 t layer exposed Physician Procedures : CPT4 Code Description Modifier [id number] 11042 - WC PHYS SUBQ TISS 20 SQ CM ICD-10 Diagnosis Description L97.822 Non-pressure chronic ulcer of other part of left lower leg with fat layer exposed Quantity: 1 Electronic Signature(s) Signed: 08/17/2023  8:41:18 AM By: Duanne Guess MD FACS Entered By: Duanne Guess on 08/17/2023 05:27:55

## 2023-08-24 ENCOUNTER — Encounter (HOSPITAL_BASED_OUTPATIENT_CLINIC_OR_DEPARTMENT_OTHER): Payer: Medicare PPO | Admitting: General Surgery

## 2023-08-24 DIAGNOSIS — S81802A Unspecified open wound, left lower leg, initial encounter: Secondary | ICD-10-CM | POA: Diagnosis not present

## 2023-08-24 DIAGNOSIS — L97822 Non-pressure chronic ulcer of other part of left lower leg with fat layer exposed: Secondary | ICD-10-CM | POA: Diagnosis not present

## 2023-08-24 DIAGNOSIS — L723 Sebaceous cyst: Secondary | ICD-10-CM | POA: Diagnosis not present

## 2023-08-24 NOTE — Progress Notes (Signed)
Jessica, CORTES [name] ([id number]) [id number] Page 1 of 6 Visit Report for 08/24/2023 Arrival Information Details Patient Name: Date of Service: Jessica [name] 08/24/2023 8:30 A M Medical Record Number: [id number] Patient Account Number: [id number] Date of Birth/Sex: Treating RN: [birth year] (82 y.o. Jessica [name] Primary Care Lonnell Chaput: Ardean Larsen Other Clinician: Referring Malary Aylesworth: Treating Moustapha Tooker/Extender: Alfonse Ras in Treatment: 13 Visit Information History Since Last Visit Added or deleted any medications: No Patient Arrived: Cane Any new allergies or adverse reactions: No Arrival Time: 08:39 Had a fall or experienced change in No Accompanied By: self activities of daily living that may affect Transfer Assistance: None risk of falls: Patient Identification Verified: Yes Signs or symptoms of abuse/neglect since last visito No Secondary Verification Process Completed: Yes Hospitalized since last visit: No Patient Requires Transmission-Based Precautions: No Implantable device outside of the clinic excluding No Patient Has Alerts: Yes cellular tissue based products placed in the center Patient Alerts: Unable to complete ABI L since last visit: Has Dressing in Place as Prescribed: Yes Pain Present Now: No Electronic Signature(s) Signed: 08/24/2023 3:45:54 PM By: Redmond Pulling RN, BSN Entered By: Redmond Pulling on 08/24/2023 05:39:56 -------------------------------------------------------------------------------- Lower Extremity Assessment Details Patient Name: Date of Service: Jessica [name]. 08/24/2023 8:30 A M Medical Record Number: [id number] Patient Account Number: [id number] Date of Birth/Sex: Treating RN: 30-May-1941 (82 y.o. Jessica [name] Primary Care Delrico Minehart: Ardean Larsen Other Clinician: Referring Satara Virella: Treating Oreoluwa Gilmer/Extender: Juventino Slovak, Rinka Weeks in  Treatment: 13 Edema Assessment Assessed: [Left: No] [Right: No] Edema: [Left: N] [Right: o] Calf Left: Right: Point of Measurement: 30 cm From Medial Instep 29 cm Ankle Left: Right: Point of Measurement: 9 cm From Medial Instep 16.8 cm Vascular Assessment Pulses: Dorsalis Pedis Palpable: [Left:Yes] Extremity colors, hair growth, and conditions: Jessica, JAKEWAY [name] ([id number]) [Right:[id number] Page 2 of 6] Extremity Color: [Left:Normal] Hair Growth on Extremity: [Left:No] Temperature of Extremity: [Left:Warm] Capillary Refill: [Left:< 3 seconds] Dependent Rubor: [Left:No No] Electronic Signature(s) Signed: 08/24/2023 3:45:54 PM By: Redmond Pulling RN, BSN Entered By: Redmond Pulling on 08/24/2023 05:39:21 -------------------------------------------------------------------------------- Multi Wound Chart Details Patient Name: Date of Service: Jessica [name]. 08/24/2023 8:30 A M Medical Record Number: [id number] Patient Account Number: [id number] Date of Birth/Sex: Treating RN: 24-Jan-1941 (82 y.o. F) Primary Care Shonda Mandarino: Ardean Larsen Other Clinician: Referring Piera Downs: Treating Abdulkadir Emmanuel/Extender: Juventino Slovak, Rinka Weeks in Treatment: 13 Vital Signs Height(in): 60 Pulse(bpm): 62 Weight(lbs): 98 Blood Pressure(mmHg): 112/68 Body Mass Index(BMI): 19.1 Temperature(F): 97.3 Respiratory Rate(breaths/min): 18 [1:Photos:] [N/A:N/A] Left Lower Leg N/A N/A Wound Location: Trauma N/A N/A Wounding Event: Trauma, Other N/A N/A Primary Etiology: Glaucoma, Hypertension N/A N/A Comorbid History: 12/28/2022 N/A N/A Date Acquired: 13 N/A N/A Weeks of Treatment: Open N/A N/A Wound Status: No N/A N/A Wound Recurrence: 0.6x0.4x0.2 N/A N/A Measurements L x W x D (cm) 0.188 N/A N/A A (cm) : rea 0.038 N/A N/A Volume (cm) : 82.30% N/A N/A % Reduction in A rea: 64.20% N/A N/A % Reduction in Volume: Full Thickness Without  Exposed N/A N/A Classification: Support Structures Small N/A N/A Exudate A mount: Serous N/A N/A Exudate Type: amber N/A N/A Exudate Color: Epibole N/A N/A Wound Margin: Large (67-100%) N/A N/A Granulation A mount: Pink N/A N/A Granulation Quality: Small (1-33%) N/A N/A Necrotic A mount: Fat Layer (Subcutaneous Tissue): Yes N/A N/A Exposed Structures: Fascia: No Tendon: No Muscle: No Joint: No Bone: No Large (67-100%) N/A N/A  Epithelialization: Debridement - Excisional N/A N/A Debridement: Pre-procedure Verification/Time Out 08:45 N/A N/A Taken: Jessica, [name] ([id number]) [id number] Page 3 of 6 Lidocaine 5% topical ointment N/A N/A Pain Control: Subcutaneous, Slough N/A N/A Tissue Debrided: Skin/Subcutaneous Tissue N/A N/A Level: 0.19 N/A N/A Debridement A (sq cm): rea Curette N/A N/A Instrument: Minimum N/A N/A Bleeding: Pressure N/A N/A Hemostasis A chieved: Procedure was tolerated well N/A N/A Debridement Treatment Response: 0.6x0.4x0.2 N/A N/A Post Debridement Measurements L x W x D (cm) 0.038 N/A N/A Post Debridement Volume: (cm) No Abnormalities Noted N/A N/A Periwound Skin Texture: Maceration: No N/A N/A Periwound Skin Moisture: Dry/Scaly: No No Abnormalities Noted N/A N/A Periwound Skin Color: No Abnormality N/A N/A Temperature: Debridement N/A N/A Procedures Performed: Treatment Notes Electronic Signature(s) Signed: 08/24/2023 9:34:15 AM By: Duanne Guess MD FACS Entered By: Duanne Guess on 08/24/2023 06:04:15 -------------------------------------------------------------------------------- Multi-Disciplinary Care Plan Details Patient Name: Date of Service: Jessica [name]. 08/24/2023 8:30 A M Medical Record Number: [id number] Patient Account Number: [id number] Date of Birth/Sex: Treating RN: 01/10/1941 (82 y.o. Jessica [name] Primary Care Kirstina Leinweber: Ardean Larsen Other  Clinician: Referring Tory Septer: Treating Euriah Matlack/Extender: Alfonse Ras in Treatment: 13 Multidisciplinary Care Plan reviewed with physician Active Inactive Wound/Skin Impairment Nursing Diagnoses: Impaired tissue integrity Goals: Patient/caregiver will verbalize understanding of skin care regimen Date Initiated: 05/23/2023 Target Resolution Date: 09/28/2023 Goal Status: Active Interventions: Assess ulceration(s) every visit Treatment Activities: Skin care regimen initiated : 05/23/2023 Notes: Electronic Signature(s) Signed: 08/24/2023 3:45:54 PM By: Redmond Pulling RN, BSN Entered By: Redmond Pulling on 08/24/2023 05:47:47 Jessica [name] ([id number]) [id number] Page 4 of 6 -------------------------------------------------------------------------------- Pain Assessment Details Patient Name: Date of Service: Jessica [name]. 08/24/2023 8:30 A M Medical Record Number: [id number] Patient Account Number: [id number] Date of Birth/Sex: Treating RN: 08-22-1941 (82 y.o. Jessica [name] Primary Care Jaevian Shean: Ardean Larsen Other Clinician: Referring Dameion Briles: Treating Caridad Silveira/Extender: Juventino Slovak, Rinka Weeks in Treatment: 84 Active Problems Location of Pain Severity and Description of Pain Patient Has Paino No Site Locations Pain Management and Medication Current Pain Management: Electronic Signature(s) Signed: 08/24/2023 3:45:54 PM By: Redmond Pulling RN, BSN Entered By: Redmond Pulling on 08/24/2023 05:40:05 -------------------------------------------------------------------------------- Patient/Caregiver Education Details Patient Name: Date of Service: Jessica [name] 12/26/2024andnbsp8:30 A M Medical Record Number: [id number] Patient Account Number: [id number] Date of Birth/Gender: Treating RN: Jul 08, 1941 (82 y.o. Jessica [name] Primary Care Physician: Ardean Larsen Other  Clinician: Referring Physician: Treating Physician/Extender: Alfonse Ras in Treatment: 13 Education Assessment Education Provided To: Patient Education Topics Provided Wound/Skin Impairment: Methods: Explain/Verbal Responses: State content correctly [employer]) Signed: 08/24/2023 3:45:54 PM By: Redmond Pulling RN, BSN Entered By: Redmond Pulling on 08/24/2023 05:48:00 Jessica [name] ([id number]) [id number] Page 5 of 6 -------------------------------------------------------------------------------- Wound Assessment Details Patient Name: Date of Service: Jessica [name]. 08/24/2023 8:30 A M Medical Record Number: [id number] Patient Account Number: [id number] Date of Birth/Sex: Treating RN: 1940/11/26 (82 y.o. Jessica [name] Primary Care Ashdon Gillson: Ardean Larsen Other Clinician: Referring Evangaline Jou: Treating Febe Champa/Extender: Juventino Slovak, Rinka Weeks in Treatment: 13 Wound Status Wound Number: 1 Primary Etiology: Trauma, Other Wound Location: Left Lower Leg Wound Status: Open Wounding Event: Trauma Comorbid History: Glaucoma, Hypertension Date Acquired: 12/28/2022 Weeks Of Treatment: 13 Clustered Wound: No Photos Wound Measurements Length: (cm) 0.6 Width: (cm) 0.4 Depth: (cm) 0.2 Area: (cm) 0.188 Volume: (cm) 0.038 % Reduction in Area: 82.3% % Reduction  in Volume: 64.2% Epithelialization: Large (67-100%) Tunneling: No Undermining: No Wound Description Classification: Full Thickness Without Exposed Suppor Wound Margin: Epibole Exudate Amount: Small Exudate Type: Serous Exudate Color: amber t Structures Foul Odor After Cleansing: No Slough/Fibrino Yes Wound Bed Granulation Amount: Large (67-100%) Exposed Structure Granulation Quality: Pink Fascia Exposed: No Necrotic Amount: Small (1-33%) Fat Layer (Subcutaneous Tissue) Exposed: Yes Necrotic Quality: Adherent  Slough Tendon Exposed: No Muscle Exposed: No Joint Exposed: No Bone Exposed: No Periwound Skin Texture Texture Color No Abnormalities Noted: Yes No Abnormalities Noted: Yes Moisture Temperature / Pain No Abnormalities Noted: Yes Temperature: No Abnormality Electronic Signature(s) Signed: 08/24/2023 3:45:54 PM By: Redmond Pulling RN, BSN Entered By: Redmond Pulling on 08/24/2023 05:41:59 Jessica [name] ([id number]) [id number] Page 6 of 6 -------------------------------------------------------------------------------- Vitals Details Patient Name: Date of Service: Jessica [name] 08/24/2023 8:30 A M Medical Record Number: [id number] Patient Account Number: [id number] Date of Birth/Sex: Treating RN: [birth year] (82 y.o. Jessica [name] Primary Care Jahnyla Parrillo: Ardean Larsen Other Clinician: Referring Faiga Stones: Treating Kunio Cummiskey/Extender: Juventino Slovak, Rinka Weeks in Treatment: 13 Vital Signs Time Taken: 08:44 Temperature (F): 97.3 Height (in): 60 Pulse (bpm): 62 Weight (lbs): 98 Respiratory Rate (breaths/min): 18 Body Mass Index (BMI): 19.1 Blood Pressure (mmHg): 112/68 Reference Range: 80 - 120 mg / dl Electronic Signature(s) Signed: 08/24/2023 3:45:54 PM By: Redmond Pulling RN, BSN Entered By: Redmond Pulling on 08/24/2023 05:44:57

## 2023-08-24 NOTE — Progress Notes (Signed)
TASHYIA, BOWERMAN B ([id number]) [id number] Page 1 of 7 Visit Report for 08/24/2023 Chief Complaint Document Details Patient Name: Date of Service: Jessica [name] 08/24/2023 8:30 A M Medical Record Number: [id number] Patient Account Number: [id number] Date of Birth/Sex: Treating RN: Mar 04, 1941 (82 y.o. F) Primary Care Provider: Ardean Larsen Other Clinician: Referring Provider: Treating Provider/Extender: Juventino Slovak, Rinka Weeks in Treatment: 13 Information Obtained from: Patient Chief Complaint Patient seen for complaints of Non-Healing Wound. Electronic Signature(s) Signed: 08/24/2023 9:34:15 AM By: Duanne Guess MD FACS Entered By: Duanne Guess on 08/24/2023 06:05:01 -------------------------------------------------------------------------------- Debridement Details Patient Name: Date of Service: [name] 08/24/2023 8:30 A M Medical Record Number: [id number] Patient Account Number: [id number] Date of Birth/Sex: Treating RN: [birth year] (82 y.o. Orville Govern Primary Care Provider: Ardean Larsen Other Clinician: Referring Provider: Treating Provider/Extender: Alfonse Ras in Treatment: 13 Debridement Performed for Assessment: Wound #1 Left Lower Leg Performed By: Physician Duanne Guess, MD The following information was scribed by: Redmond Pulling The information was scribed for: Duanne Guess Debridement Type: Debridement Level of Consciousness (Pre-procedure): Awake and Alert Pre-procedure Verification/Time Out Yes - 08:45 Taken: Start Time: 08:45 Pain Control: Lidocaine 5% topical ointment Percent of Wound Bed Debrided: 100% T Area Debrided (cm): otal 0.19 Tissue and other material debrided: Non-Viable, Slough, Subcutaneous, Slough Level: Skin/Subcutaneous Tissue Debridement Description: Excisional Instrument: Curette Bleeding: Minimum Hemostasis Achieved:  Pressure Response to Treatment: Procedure was tolerated well Level of Consciousness (Post- Awake and Alert procedure): Post Debridement Measurements of Total Wound Length: (cm) 0.6 Width: (cm) 0.4 Depth: (cm) 0.2 Volume: (cm) 0.038 Character of Wound/Ulcer Post Debridement: Improved Post Procedure Diagnosis OTHA, CARLYLE B ([id number]) [id number] Page 2 of 7 Same as Pre-procedure Electronic Signature(s) Signed: 08/24/2023 9:34:15 AM By: Duanne Guess MD FACS Signed: 08/24/2023 3:45:54 PM By: Redmond Pulling RN, BSN Entered By: Redmond Pulling on 08/24/2023 06:03:34 -------------------------------------------------------------------------------- HPI Details Patient Name: Date of Service: [name] 08/24/2023 8:30 A M Medical Record Number: [id number] Patient Account Number: [id number] Date of Birth/Sex: Treating RN: January 30, 1941 (82 y.o. F) Primary Care Provider: Ardean Larsen Other Clinician: Referring Provider: Treating Provider/Extender: Juventino Slovak, Rinka Weeks in Treatment: 13 History of Present Illness HPI Description: ADMISSION 05/23/2023 ***PATIENT UNABLE TO TOLERATE CUFFS FOR ABI*** This is a relatively healthy 82 year old woman, non-smoker and nondiabetic, with primarily orthopedic issues in her past medical history. She was referred to the clinic today by her primary care doctor to address a left lower leg wound. The wound apparently occurred in May while she was gardening. She is uncertain exactly how it happened but perhaps struck her leg on a stick or similar protruding structure. She completed a course of both clindamycin and Augmentin based upon a wound swab culture that grew methicillin sensitive Staph aureus. She has been applying some triamcinolone cream that she had from a previous wound care admission at the [facility] wound care center. 05/30/2023: The wound measured smaller today. It is also cleaner,  with just a little bit of slough on the surface. No significant periwound erythema. 06/06/2023: The wound is smaller again today. There is minimal slough on the surface. There is some dry eschar around the wound edges. Edema control is excellent. 06/13/2023: The wound is just the slightest bit smaller today. The skin edges do appear to be starting to roll inward. Edema control is good. 06/20/2023: Once again, the wound measured just the slightest bit smaller today. There is  a little bit of slough on the surface. 06/27/2023: The wound measurements are essentially unchanged. The wound surface, however, does appear a bit more robust. 07/04/2023: No significant change to the wound dimensions. The skin edges are rolling inward. There is slough on the surface. Edema control is good. 07/11/2023: The wound measured slightly smaller today. The skin edges continue to roll inward. 07/18/2023: The wound measured smaller today. There is some slough accumulation on the surface. She is also complaining about a spot on the back of her neck. She says that it will periodically swell and then she squeezes it, extruding waxy white material. She says it has been present for several years but it bothers her when it swells up. 07/24/2023: The wound has not changed. The skin edges continue to roll inward and the surface is quite fibrotic. She does have an appointment with general surgery coming up to address her sebaceous cyst. 08/17/2023: The wound is about the same. The skin edges are scrolled, but perhaps not as much as in the past. There is slough on the wound surface. She indicated today that she declined to permit me to address the skin edges when I debrided, but would allow me to debride the slough. She also declines any sort of compressive wrap. 08/24/2023: No real change to the wound. There is some slough on the surface. The underlying tissue is fibrotic, without any significant granulation  tissue formation. Electronic Signature(s) Signed: 08/24/2023 9:34:15 AM By: Duanne Guess MD FACS Entered By: Duanne Guess on 08/24/2023 06:06:00 -------------------------------------------------------------------------------- Physical Exam Details Patient Name: Date of Service: Jessica [name] 08/24/2023 8:30 A ALEXSUS, WEHRLE B ([id number]) [id number] Page 3 of 7 Medical Record Number: [id number] Patient Account Number: [id number] Date of Birth/Sex: Treating RN: Nov 12, 1940 (82 y.o. F) Primary Care Provider: Ardean Larsen Other Clinician: Referring Provider: Treating Provider/Extender: Juventino Slovak, Rinka Weeks in Treatment: 13 Constitutional . . . . no acute distress. Respiratory Normal work of breathing on room air.. Notes 08/24/2023: No real change to the wound. There is some slough on the surface. The underlying tissue is fibrotic, without any significant granulation tissue formation. Electronic Signature(s) Signed: 08/24/2023 9:34:15 AM By: Duanne Guess MD FACS Entered By: Duanne Guess on 08/24/2023 06:06:25 -------------------------------------------------------------------------------- Physician Orders Details Patient Name: Date of Service: [name] 08/24/2023 8:30 A M Medical Record Number: [id number] Patient Account Number: [id number] Date of Birth/Sex: Treating RN: 04-Oct-1940 (82 y.o. Orville Govern Primary Care Provider: Ardean Larsen Other Clinician: Referring Provider: Treating Provider/Extender: Alfonse Ras in Treatment: 76 Verbal / Phone Orders: No Diagnosis Coding ICD-10 Coding Code Description [phone] Non-pressure chronic ulcer of other part of left lower leg with fat layer exposed Follow-up Appointments ppointment in 1 week. - Dr. Lady Gary Return A 1/3 @ 09:30 am Other: - May use KY jelly in place of hydrogel Anesthetic (In clinic)  Topical Lidocaine 4% applied to wound bed Bathing/ Shower/ Hygiene May shower and wash wound with soap and water. Additional Orders / Instructions Follow Nutritious Diet - Try and increase protein to 70g-100g per day. Wound Treatment Wound #1 - Lower Leg Wound Laterality: Left Cleanser: Soap and Water Every Other Day/30 Days Discharge Instructions: May shower and wash wound with dial antibacterial soap and water prior to dressing change. Cleanser: Vashe 5.8 (oz) Every Other Day/30 Days Discharge Instructions: Cleanse the wound with Vashe prior to applying a clean dressing using gauze sponges, not tissue or cotton balls. Peri-Wound  Care: Sween Lotion (Moisturizing lotion) Every Other Day/30 Days Discharge Instructions: As needed-Apply moisturizing lotion as directed Topical: Skintegrity Hydrogel 4 (oz) Every Other Day/30 Days Discharge Instructions: Apply hydrogel as directed Prim Dressing: Promogran Prisma Matrix, 4.34 (sq in) (silver collagen) Every Other Day/30 Days ary Discharge Instructions: Moisten collagen with saline or hydrogel PRISCILLA, SOIFER B ([id number]) [id number].pdf Page 4 of 7 Secondary Dressing: Zetuvit Plus Silicone Border Dressing 3x3 (in/in) Every Other Day/30 Days Discharge Instructions: Apply silicone border over primary dressing as directed. Patient Medications llergies: Gluten Protein A Notifications Medication Indication Start End 08/24/2023 lidocaine DOSE topical 5 % ointment - ointment topical once daily Electronic Signature(s) Signed: 08/24/2023 9:34:15 AM By: Duanne Guess MD FACS Entered By: Duanne Guess on 08/24/2023 06:07:42 -------------------------------------------------------------------------------- Problem List Details Patient Name: Date of Service: [name] 08/24/2023 8:30 A M Medical Record Number: [id number] Patient Account Number: [id number] Date of Birth/Sex: Treating RN: [birth year] (82  y.o. F) Primary Care Provider: Ardean Larsen Other Clinician: Referring Provider: Treating Provider/Extender: Juventino Slovak, Rinka Weeks in Treatment: 8 Active Problems ICD-10 Encounter Code Description Active Date MDM Diagnosis L97.822 Non-pressure chronic ulcer of other part of left lower leg with fat layer exposed9/24/2024 No Yes Inactive Problems ICD-10 Code Description Active Date Inactive Date L72.3 Sebaceous cyst 07/18/2023 07/18/2023 Resolved Problems Electronic Signature(s) Signed: 08/24/2023 9:34:15 AM By: Duanne Guess MD FACS Entered By: Duanne Guess on 08/24/2023 06:03:59 -------------------------------------------------------------------------------- Progress Note Details Patient Name: Date of Service: [name] 08/24/2023 8:30 A M Medical Record Number: [id number] Patient Account Number: [id number] Date of Birth/Sex: Treating RN: August 14, 1941 (82 y.o. F) Primary Care Provider: Ardean Larsen Other Clinician: SEBASTIANA, SHAFRAN ([id number]) [id number] Page 5 of 7 Referring Provider: Treating Provider/Extender: Juventino Slovak, Rinka Weeks in Treatment: 13 Subjective Chief Complaint Information obtained from Patient Patient seen for complaints of Non-Healing Wound. History of Present Illness (HPI) ADMISSION 05/23/2023 ***PATIENT UNABLE TO TOLERATE CUFFS FOR ABI*** This is a relatively healthy 82 year old woman, non-smoker and nondiabetic, with primarily orthopedic issues in her past medical history. She was referred to the clinic today by her primary care doctor to address a left lower leg wound. The wound apparently occurred in May while she was gardening. She is uncertain exactly how it happened but perhaps struck her leg on a stick or similar protruding structure. She completed a course of both clindamycin and Augmentin based upon a wound swab culture that grew methicillin sensitive Staph  aureus. She has been applying some triamcinolone cream that she had from a previous wound care admission at the [facility] wound care center. 05/30/2023: The wound measured smaller today. It is also cleaner, with just a little bit of slough on the surface. No significant periwound erythema. 06/06/2023: The wound is smaller again today. There is minimal slough on the surface. There is some dry eschar around the wound edges. Edema control is excellent. 06/13/2023: The wound is just the slightest bit smaller today. The skin edges do appear to be starting to roll inward. Edema control is good. 06/20/2023: Once again, the wound measured just the slightest bit smaller today. There is a little bit of slough on the surface. 06/27/2023: The wound measurements are essentially unchanged. The wound surface, however, does appear a bit more robust. 07/04/2023: No significant change to the wound dimensions. The skin edges are rolling inward. There is slough on the surface. Edema control is good. 07/11/2023: The wound measured slightly smaller today. The skin edges continue  to roll inward. 07/18/2023: The wound measured smaller today. There is some slough accumulation on the surface. She is also complaining about a spot on the back of her neck. She says that it will periodically swell and then she squeezes it, extruding waxy white material. She says it has been present for several years but it bothers her when it swells up. 07/24/2023: The wound has not changed. The skin edges continue to roll inward and the surface is quite fibrotic. She does have an appointment with general surgery coming up to address her sebaceous cyst. 08/17/2023: The wound is about the same. The skin edges are scrolled, but perhaps not as much as in the past. There is slough on the wound surface. She indicated today that she declined to permit me to address the skin edges when I debrided, but would allow me to debride the slough. She also  declines any sort of compressive wrap. 08/24/2023: No real change to the wound. There is some slough on the surface. The underlying tissue is fibrotic, without any significant granulation tissue formation. Objective Constitutional no acute distress. Vitals Time Taken: 8:44 AM, Height: 60 in, Weight: 98 lbs, BMI: 19.1, Temperature: 97.3 F, Pulse: 62 bpm, Respiratory Rate: 18 breaths/min, Blood Pressure: 112/68 mmHg. Respiratory Normal work of breathing on room air.. General Notes: 08/24/2023: No real change to the wound. There is some slough on the surface. The underlying tissue is fibrotic, without any significant granulation tissue formation. Integumentary (Hair, Skin) Wound #1 status is Open. Original cause of wound was Trauma. The date acquired was: 12/28/2022. The wound has been in treatment 13 weeks. The wound is located on the Left Lower Leg. The wound measures 0.6cm length x 0.4cm width x 0.2cm depth; 0.188cm^2 area and 0.038cm^3 volume. There is Fat Layer (Subcutaneous Tissue) exposed. There is no tunneling or undermining noted. There is a small amount of serous drainage noted. The wound margin is epibole. There is large (67-100%) pink granulation within the wound bed. There is a small (1-33%) amount of necrotic tissue within the wound bed including Adherent Slough. The periwound skin appearance had no abnormalities noted for texture. The periwound skin appearance had no abnormalities noted for moisture. The periwound skin appearance had no abnormalities noted for color. Periwound temperature was noted as No Abnormality. Assessment Active Problems LEXANI, HARER B ([id number]) [id number] Page 6 of 7 ICD-10 Non-pressure chronic ulcer of other part of left lower leg with fat layer exposed Procedures Wound #1 Pre-procedure diagnosis of Wound #1 is a Trauma, Other located on the Left Lower Leg . There was a Excisional Skin/Subcutaneous Tissue Debridement  with a total area of 0.19 sq cm performed by Duanne Guess, MD. With the following instrument(s): Curette to remove Non-Viable tissue/material. Material removed includes Subcutaneous Tissue and Slough and after achieving pain control using Lidocaine 5% topical ointment. No specimens were taken. A time out was conducted at 08:45, prior to the start of the procedure. A Minimum amount of bleeding was controlled with Pressure. The procedure was tolerated well. Post Debridement Measurements: 0.6cm length x 0.4cm width x 0.2cm depth; 0.038cm^3 volume. Character of Wound/Ulcer Post Debridement is improved. Post procedure Diagnosis Wound #1: Same as Pre-Procedure Plan Follow-up Appointments: Return Appointment in 1 week. - Dr. Lady Gary 1/3 @ 09:30 am Other: - May use KY jelly in place of hydrogel Anesthetic: (In clinic) Topical Lidocaine 4% applied to wound bed Bathing/ Shower/ Hygiene: May shower and wash wound with soap and water. Additional Orders / Instructions: Follow Nutritious  Diet - Try and increase protein to 70g-100g per day. The following medication(s) was prescribed: lidocaine topical 5 % ointment ointment topical once daily was prescribed at facility WOUND #1: - Lower Leg Wound Laterality: Left Cleanser: Soap and Water Every Other Day/30 Days Discharge Instructions: May shower and wash wound with dial antibacterial soap and water prior to dressing change. Cleanser: Vashe 5.8 (oz) Every Other Day/30 Days Discharge Instructions: Cleanse the wound with Vashe prior to applying a clean dressing using gauze sponges, not tissue or cotton balls. Peri-Wound Care: Sween Lotion (Moisturizing lotion) Every Other Day/30 Days Discharge Instructions: As needed-Apply moisturizing lotion as directed Topical: Skintegrity Hydrogel 4 (oz) Every Other Day/30 Days Discharge Instructions: Apply hydrogel as directed Prim Dressing: Promogran Prisma Matrix, 4.34 (sq in) (silver collagen) Every Other Day/30  Days ary Discharge Instructions: Moisten collagen with saline or hydrogel Secondary Dressing: Zetuvit Plus Silicone Border Dressing 3x3 (in/in) Every Other Day/30 Days Discharge Instructions: Apply silicone border over primary dressing as directed. 08/24/2023: No real change to the wound. There is some slough on the surface. The underlying tissue is fibrotic, without any significant granulation tissue formation. I used a curette to debride slough and subcutaneous tissue from the wound. Apparently, she has not been moistening the collagen when she has been changing her dressing; I am going to have her moisten it with hydrogel to see if we can improve the moisture balance and hopefully encourage ingrowth of granulation tissue. She declines compression wraps. She will follow-up in 1 week. Electronic Signature(s) Signed: 08/24/2023 9:34:15 AM By: Duanne Guess MD FACS Entered By: Duanne Guess on 08/24/2023 06:08:41 -------------------------------------------------------------------------------- SuperBill Details Patient Name: Date of Service: Jessica [name] 08/24/2023 Medical Record Number: [id number] Patient Account Number: [id number] Date of Birth/Sex: Treating RN: [birth year] (82 y.o. 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

## 2023-09-01 ENCOUNTER — Encounter (HOSPITAL_BASED_OUTPATIENT_CLINIC_OR_DEPARTMENT_OTHER): Payer: Medicare PPO | Attending: General Surgery | Admitting: General Surgery

## 2023-09-01 DIAGNOSIS — L97822 Non-pressure chronic ulcer of other part of left lower leg with fat layer exposed: Secondary | ICD-10-CM | POA: Diagnosis not present

## 2023-09-01 DIAGNOSIS — S81802A Unspecified open wound, left lower leg, initial encounter: Secondary | ICD-10-CM | POA: Diagnosis not present

## 2023-09-01 NOTE — Progress Notes (Signed)
 January, [place] Claudie  [name] ([id number]) [id number] Page 1 of 9 Visit Report for 09/01/2023 Arrival Information Details Patient Name: Date of Service: Jessica [name] Jessica [name] 09/01/2023 9:30 A [name] Medical Record Number: [id number] Patient Account Number: [id number] Date of Birth/Sex: Treating RN: 1940/11/21 (83 y.o. F) Primary Care Jessica [name]: Jessica [name] Other Clinician: Referring Jessica [name]: Treating Jessica [name]/Extender: Jessica [name], Rinka Weeks in Treatment: 14 Visit Information History Since Last Visit Added or deleted any medications: No Patient Arrived: Cane Any new allergies or adverse reactions: No Arrival Time: 09:36 Had a fall or experienced change in No Accompanied By: self activities of daily living that may affect Transfer Assistance: None risk of falls: Patient Identification Verified: Yes Signs or symptoms of abuse/neglect since last visito No Secondary Verification Process Completed: Yes Hospitalized since last visit: No Patient Requires Transmission-Based Precautions: No Implantable device outside of the clinic excluding No Patient Has Alerts: Yes cellular tissue based products placed in the center Patient Alerts: Unable to complete ABI L since last visit: Pain Present Now: No Electronic Signature(s) Signed: 09/01/2023 9:43:08 AM By: Jessica [name] Entered By: Jessica [name] on 09/01/2023 09:37:31 -------------------------------------------------------------------------------- Clinic Level of Care Assessment Details Patient Name: Date of Service: Jessica [name] Jessica [name] 09/01/2023 9:30 A [name] Medical Record Number: [id number] Patient Account Number: [id number] Date of Birth/Sex: Treating RN: 02/25/1941 (83 y.o. Jessica [name] Primary Care Jessica [name]: Jessica [name] Other Clinician: Referring Jessica [name]: Treating Jessica [name]/Extender: Jessica [name] [name] Devra in Treatment: 14 Clinic Level of Care Assessment Items TOOL 4 Quantity  Score []  - 0 Use when only an EandM is performed on FOLLOW-UP visit ASSESSMENTS - Nursing Assessment / Reassessment X- 1 10 Reassessment of Co-morbidities (includes updates in patient status) X- 1 5 Reassessment of Adherence to Treatment Plan ASSESSMENTS - Wound and Skin A ssessment / Reassessment X - Simple Wound Assessment / Reassessment - one wound 1 5 []  - 0 Complex Wound Assessment / Reassessment - multiple wounds []  - 0 Dermatologic / Skin Assessment (not related to wound area) ASSESSMENTS - Focused Assessment []  - 0 Circumferential Edema Measurements - multi extremities []  - 0 Nutritional Assessment / Counseling / Intervention X- 1 5 Lower Extremity Assessment (monofilament, tuning fork, pulses) [name], Jessica  [name] ([id number]) [id number] Page 2 of 9 []  - 0 Peripheral Arterial Disease Assessment (using hand held doppler) ASSESSMENTS - Ostomy and/or Continence Assessment and Care []  - 0 Incontinence Assessment and Management []  - 0 Ostomy Care Assessment and Management (repouching, etc.) PROCESS - Coordination of Care X - Simple Patient / Family Education for ongoing care 1 15 []  - 0 Complex (extensive) Patient / Family Education for ongoing care X- 1 10 Staff obtains Chiropractor, Records, T Results / Process Orders est []  - 0 Staff telephones HHA, Nursing Homes / Clarify orders / etc []  - 0 Routine Transfer to another Facility (non-emergent condition) []  - 0 Routine Hospital Admission (non-emergent condition) []  - 0 New Admissions / Manufacturing Engineer / Ordering NPWT Apligraf, etc. , []  - 0 Emergency Hospital Admission (emergent condition) X- 1 10 Simple Discharge Coordination []  - 0 Complex (extensive) Discharge Coordination PROCESS - Special Needs []  - 0 Pediatric / Minor Patient Management []  - 0 Isolation Patient Management []  - 0 Hearing / Language / Visual special needs []  - 0 Assessment of Community assistance  (transportation, D/C planning, etc.) []  - 0 Additional assistance / Altered mentation []  - 0 Support Surface(s) Assessment (bed, cushion, seat, etc.) INTERVENTIONS - Wound Cleansing / Measurement X -  Simple Wound Cleansing - one wound 1 5 []  - 0 Complex Wound Cleansing - multiple wounds X- 1 5 Wound Imaging (photographs - any number of wounds) []  - 0 Wound Tracing (instead of photographs) X- 1 5 Simple Wound Measurement - one wound []  - 0 Complex Wound Measurement - multiple wounds INTERVENTIONS - Wound Dressings X - Small Wound Dressing one or multiple wounds 1 10 []  - 0 Medium Wound Dressing one or multiple wounds []  - 0 Large Wound Dressing one or multiple wounds []  - 0 Application of Medications - topical []  - 0 Application of Medications - injection INTERVENTIONS - Miscellaneous []  - 0 External ear exam []  - 0 Specimen Collection (cultures, biopsies, blood, body fluids, etc.) []  - 0 Specimen(s) / Culture(s) sent or taken to Lab for analysis []  - 0 Patient Transfer (multiple staff / Nurse, Adult / Similar devices) []  - 0 Simple Staple / Suture removal (25 or less) []  - 0 Complex Staple / Suture removal (26 or more) []  - 0 Hypo / Hyperglycemic Management (close monitor of Blood Glucose) []  - 0 Ankle / Brachial Index (ABI) - do not check if billed separately [name], Jessica  [name] ([id number]) [id number] Page 3 of 9 X- 1 5 Vital Signs Has the patient been seen at the hospital within the last three years: Yes Total Score: 90 Level Of Care: New/Established - Level 3 Electronic Signature(s) Signed: 09/01/2023 12:47:24 PM By: Merleen Handing RN, BSN Entered By: Merleen [name] on 09/01/2023 10:22:20 -------------------------------------------------------------------------------- Encounter Discharge Information Details Patient Name: Date of Service: Jessica [name] Jessica Jessica ROWAN [name]. 09/01/2023 9:30 A [name] Medical Record Number: [id number] Patient Account  Number: [id number] Date of Birth/Sex: Treating RN: 01/27/1941 (83 y.o. Jessica [name] Primary Care Jessica [name]: Jessica [name] Other Clinician: Referring Dyllan Kats: Treating Arvie Villarruel/Extender: Jessica [name] [name] Devra in Treatment: 14 Encounter Discharge Information Items Discharge Condition: Stable Ambulatory Status: Cane Discharge Destination: Home Transportation: Private Auto Accompanied By: self Schedule Follow-up Appointment: Yes Clinical Summary of Care: Patient Declined Electronic Signature(s) Signed: 09/01/2023 12:47:24 PM By: Merleen Handing RN, BSN Entered By: Merleen [name] on 09/01/2023 10:23:32 -------------------------------------------------------------------------------- Lower Extremity Assessment Details Patient Name: Date of Service: Jessica [name] Jessica Jessica ROWAN [name]. 09/01/2023 9:30 A [name] Medical Record Number: [id number] Patient Account Number: [id number] Date of Birth/Sex: Treating RN: 30-May-1941 (83 y.o. Jessica Drury Nestle Primary Care Amiria Orrison: Jessica [name] Other Clinician: Referring Deaunna Olarte: Treating Renatta Shrieves/Extender: Jessica [name], Rinka Weeks in Treatment: 14 Edema Assessment Assessed: [Left: Yes] [Right: No] Edema: [Left: N] [Right: o] Calf Left: Right: Point of Measurement: 30 cm From Medial Instep 29 cm Ankle Left: Right: Point of Measurement: 9 cm From Medial Instep 17 cm Vascular Assessment Pulses: Mooradian, Demiana  [name] ([id number]) [Right:[id number] Page 4 of 9] Dorsalis Pedis Palpable: [Left:Yes] Extremity colors, hair growth, and conditions: Extremity Color: [Left:Normal] Hair Growth on Extremity: [Left:No] Temperature of Extremity: [Left:Warm] Capillary Refill: [Left:< 3 seconds] Dependent Rubor: [Left:No] Blanched when Elevated: [Left:No No] Toe Nail Assessment Left: Right: Thick: No Discolored: No Deformed: No Improper Length and Hygiene: No Electronic Signature(s) Signed: 09/01/2023  12:56:04 PM By: Drury Nestle RN, BSN Entered By: Drury Nestle on 09/01/2023 09:44:57 -------------------------------------------------------------------------------- Multi Wound Chart Details Patient Name: Date of Service: Jessica [name] Jessica Jessica ROWAN [name]. 09/01/2023 9:30 A [name] Medical Record Number: [id number] Patient Account Number: [id number] Date of Birth/Sex: Treating RN: 12-28-1940 (83 y.o. F) Primary Care Kaegan Hettich: Jessica [name] Other Clinician: Referring Terriana Barreras: Treating Natalie Mceuen/Extender: Jessica [name], Rinka Weeks in Treatment: 14 Vital  Signs Height(in): 60 Pulse(bpm): 69 Weight(lbs): 98 Blood Pressure(mmHg): 136/72 Body Mass Index(BMI): 19.1 Temperature(F): 97.5 Respiratory Rate(breaths/min): 18 [1:Photos:] [N/A:N/A] Left Lower Leg N/A N/A Wound Location: Trauma N/A N/A Wounding Event: Trauma, Other N/A N/A Primary Etiology: Glaucoma, Hypertension N/A N/A Comorbid History: 12/28/2022 N/A N/A Date Acquired: 14 N/A N/A Weeks of Treatment: Open N/A N/A Wound Status: No N/A N/A Wound Recurrence: 0.3x0.2x0.2 N/A N/A Measurements L x W x D (cm) 0.047 N/A N/A A (cm) : rea 0.009 N/A N/A Volume (cm) : 95.60% N/A N/A % Reduction in A rea: 91.50% N/A N/A % Reduction in Volume: Full Thickness Without Exposed N/A N/A Classification: Support Structures Small N/A N/A Exudate Amount: Serous N/A N/A Exudate Type: amber N/A N/A Exudate Color: Epibole N/A N/A Wound Margin: Stotts, Bernadetta  [name] ([id number]) [id number] Page 5 of 9 Large (67-100%) N/A N/A Granulation Amount: Pink N/A N/A Granulation Quality: None Present (0%) N/A N/A Necrotic Amount: Fat Layer (Subcutaneous Tissue): Yes N/A N/A Exposed Structures: Fascia: No Tendon: No Muscle: No Joint: No Bone: No Large (67-100%) N/A N/A Epithelialization: No Abnormalities Noted N/A N/A Periwound Skin Texture: Maceration: No N/A N/A Periwound Skin Moisture: Dry/Scaly:  No No Abnormalities Noted N/A N/A Periwound Skin Color: No Abnormality N/A N/A Temperature: Treatment Notes Wound #1 (Lower Leg) Wound Laterality: Left Cleanser Soap and Water  Discharge Instruction: May shower and wash wound with dial antibacterial soap and water  prior to dressing change. Peri-Wound Care Topical Skintegrity Hydrogel 4 (oz) Discharge Instruction: Apply hydrogel as directed Primary Dressing Promogran Prisma Matrix, 4.34 (sq in) (silver collagen) Discharge Instruction: Moisten collagen with saline or hydrogel Secondary Dressing Zetuvit Plus Silicone Border Dressing 3x3 (in/in) Discharge Instruction: Apply silicone border over primary dressing as directed. Secured With Compression Wrap Compression Stockings Facilities Manager) Signed: 09/01/2023 10:28:51 AM By: Jessica Nest MD FACS Entered By: Jessica Nest on 09/01/2023 10:28:51 -------------------------------------------------------------------------------- Multi-Disciplinary Care Plan Details Patient Name: Date of Service: Jessica [name] Jessica Jessica ROWAN [name]. 09/01/2023 9:30 A [name] Medical Record Number: [id number] Patient Account Number: [id number] Date of Birth/Sex: Treating RN: [birth year] (83 y.o. Jessica [name] Primary Care Trigger Frasier: Jessica [name] Other Clinician: Referring Latwan Luchsinger: Treating Cameryn Schum/Extender: Jessica Nest Jessica [name] Devra in Treatment: 14 Multidisciplinary Care Plan reviewed with physician Active Inactive Wound/Skin Impairment Nursing Diagnoses: Impaired tissue integrity Villela, Kenyonna  [name] ([id number]) [phone].pdf Page 6 of 9 Goals: Patient/caregiver will verbalize understanding of skin care regimen Date Initiated: 05/23/2023 Target Resolution Date: 09/28/2023 Goal Status: Active Interventions: Assess ulceration(s) every visit Treatment Activities: Skin care regimen initiated : 05/23/2023 Notes: Electronic Signature(s) Signed: 09/01/2023  12:47:24 PM By: Merleen Handing RN, BSN Entered By: Merleen [name] on 09/01/2023 10:15:58 -------------------------------------------------------------------------------- Pain Assessment Details Patient Name: Date of Service: Jessica [name] Jessica Jessica ROWAN [name]. 09/01/2023 9:30 A [name] Medical Record Number: [id number] Patient Account Number: [id number] Date of Birth/Sex: Treating RN: 1941/03/05 (83 y.o. F) Primary Care Cuinn Westerhold: Jessica [name] Other Clinician: Referring Calob Baskette: Treating Samiyah Stupka/Extender: Jessica Nest [name], Rinka Weeks in Treatment: 14 Active Problems Location of Pain Severity and Description of Pain Patient Has Paino No Site Locations Pain Management and Medication Current Pain Management: Electronic Signature(s) Signed: 09/01/2023 9:43:08 AM By: Jessica [name] Entered By: Jessica [name] on 09/01/2023 09:38:13 Betten, Terez  [name] ([id number]) [id number] Page 7 of 9 -------------------------------------------------------------------------------- Patient/Caregiver Education Details Patient Name: Date of Service: Jessica [name] Medical Record Number: [id number] Patient Account Number: [id number] Date of Birth/Gender: Treating RN: 1941-04-09 (83 y.o. Jessica [name] Primary Care Physician:  Pahwani, Rinka Other Clinician: Referring Physician: Treating Physician/Extender: Jessica [name] Velna Devra in Treatment: 14 Education Assessment Education Provided To: Patient Education Topics Provided Wound/Skin Impairment: Methods: Explain/Verbal Responses: Reinforcements needed, State content correctly Electronic Signature(s) Signed: 09/01/2023 12:47:24 PM By: Merleen Handing RN, BSN Entered By: Merleen [name] on 09/01/2023 10:16:48 -------------------------------------------------------------------------------- Wound Assessment Details Patient Name: Date of Service: Jessica [name] Jessica Jessica ROWAN [name]. 09/01/2023 9:30 A  [name] Medical Record Number: [id number] Patient Account Number: [id number] Date of Birth/Sex: Treating RN: 12/05/1940 (83 y.o. F) Primary Care Kilani Joffe: [name] Velna Other Clinician: Referring Nafisa Olds: Treating Sarajean Dessert/Extender: Jessica [name], Rinka Weeks in Treatment: 14 Wound Status Wound Number: 1 Primary Etiology: Trauma, Other Wound Location: Left Lower Leg Wound Status: Open Wounding Event: Trauma Comorbid History: Glaucoma, Hypertension Date Acquired: 12/28/2022 Weeks Of Treatment: 14 Clustered Wound: No Photos Wound Measurements Length: (cm) 0.3 Width: (cm) 0.2 Depth: (cm) 0.2 Area: (cm) 0.047 Volume: (cm) 0.009 % Reduction in Area: 95.6% % Reduction in Volume: 91.5% Epithelialization: Large (67-100%) Tunneling: No Undermining: No Wound Description Culton, Jaquelynn  [name] ([id number]) Classification: Full Thickness Without Exposed Support Structures Wound Margin: Epibole Exudate Amount: Small Exudate Type: Serous Exudate Color: amber [phone].pdf Page 8 of 9 Foul Odor After Cleansing: No Slough/Fibrino No Wound Bed Granulation Amount: Large (67-100%) Exposed Structure Granulation Quality: Pink Fascia Exposed: No Necrotic Amount: None Present (0%) Fat Layer (Subcutaneous Tissue) Exposed: Yes Tendon Exposed: No Muscle Exposed: No Joint Exposed: No Bone Exposed: No Periwound Skin Texture Texture Color No Abnormalities Noted: Yes No Abnormalities Noted: Yes Moisture Temperature / Pain No Abnormalities Noted: Yes Temperature: No Abnormality Treatment Notes Wound #1 (Lower Leg) Wound Laterality: Left Cleanser Soap and Water  Discharge Instruction: May shower and wash wound with dial antibacterial soap and water  prior to dressing change. Peri-Wound Care Topical Skintegrity Hydrogel 4 (oz) Discharge Instruction: Apply hydrogel as directed Primary Dressing Promogran Prisma Matrix, 4.34 (sq in) (silver collagen) Discharge  Instruction: Moisten collagen with saline or hydrogel Secondary Dressing Zetuvit Plus Silicone Border Dressing 3x3 (in/in) Discharge Instruction: Apply silicone border over primary dressing as directed. Secured With Compression Wrap Compression Stockings Facilities Manager) Signed: 09/01/2023 12:56:04 PM By: Drury Nestle RN, BSN Entered By: Drury Nestle on 09/01/2023 09:45:08 -------------------------------------------------------------------------------- Vitals Details Patient Name: Date of Service: Jessica [name] Jessica Jessica ROWAN [name]. 09/01/2023 9:30 A [name] Medical Record Number: [id number] Patient Account Number: [id number] Date of Birth/Sex: Treating RN: 12/20/1940 (83 y.o. F) Primary Care Walid Haig: [name] Velna Other Clinician: Referring Kenslee Achorn: Treating Vannie Hochstetler/Extender: Jessica [name], Rinka Weeks in Treatment: 14 Vital Signs Time Taken: 09:37 Temperature (F): 97.5 Sites, Jillyn  [name] ([id number]) [phone].pdf Page 9 of 9 Height (in): 60 Pulse (bpm): 69 Weight (lbs): 98 Respiratory Rate (breaths/min): 18 Body Mass Index (BMI): 19.1 Blood Pressure (mmHg): 136/72 Reference Range: 80 - 120 mg / dl Electronic Signature(s) Signed: 09/01/2023 9:43:08 AM By: Jessica [name] Entered By: Jessica [name] on 09/01/2023 09:38:08

## 2023-09-01 NOTE — Progress Notes (Signed)
 Blyss, Lugar Faigy  B ([id number]) [id number] Page 1 of 6 Visit Report for 09/01/2023 Chief Complaint Document Details Patient Name: Date of Service: Jessica [name] Jessica [name] Jessica [name] Jessica [name] 09/01/2023 9:30 A M Medical Record Number: [id number] Patient Account Number: [id number] Date of Birth/Sex: Treating RN: 1941-05-27 (83 y.o. F) Primary Care Provider: Vernon [name] Other Clinician: Referring Provider: Treating Provider/Extender: Jessica [name] Jessica, Jessica [name] in Treatment: 14 Information Obtained from: Patient Chief Complaint Patient seen for complaints of Non-Healing Wound. Electronic Signature(s) Signed: 09/01/2023 10:28:57 AM By: Jessica Delon MD FACS Entered By: Jessica [name] on 09/01/2023 07:28:57 -------------------------------------------------------------------------------- HPI Details Patient Name: Date of Service: Jessica [name] Jessica [name] Jessica [name] B. 09/01/2023 9:30 A M Medical Record Number: [id number] Patient Account Number: [id number] Date of Birth/Sex: Treating RN: [birth year] (83 y.o. F) Primary Care Provider: Vernon [name] Other Clinician: Referring Provider: Treating Provider/Extender: Jessica [name] Jessica, Jessica [name] in Treatment: 14 History of Present Illness HPI Description: ADMISSION 05/23/2023 ***PATIENT UNABLE TO TOLERATE CUFFS FOR ABI*** This is a relatively healthy 83 year old woman, non-smoker and nondiabetic, with primarily orthopedic issues in her past medical history. She was referred to the clinic today by her primary care doctor to address a left lower leg wound. The wound apparently occurred in May while she was gardening. She is uncertain exactly how it happened but perhaps struck her leg on a stick or similar protruding structure. She completed a course of both clindamycin and Augmentin based upon a wound swab culture that grew methicillin sensitive Staph aureus. She has been applying some triamcinolone cream that she had from a  previous wound care admission at the [facility] wound care center. 05/30/2023: The wound measured smaller today. It is also cleaner, with just a little bit of slough on the surface. No significant periwound erythema. 06/06/2023: The wound is smaller again today. There is minimal slough on the surface. There is some dry eschar around the wound edges. Edema control is excellent. 06/13/2023: The wound is just the slightest bit smaller today. The skin edges do appear to be starting to roll inward. Edema control is good. 06/20/2023: Once again, the wound measured just the slightest bit smaller today. There is a little bit of slough on the surface. 06/27/2023: The wound measurements are essentially unchanged. The wound surface, however, does appear a bit more robust. 07/04/2023: No significant change to the wound dimensions. The skin edges are rolling inward. There is slough on the surface. Edema control is good. 07/11/2023: The wound measured slightly smaller today. The skin edges continue to roll inward. 07/18/2023: The wound measured smaller today. There is some slough accumulation on the surface. She is also complaining about a spot on the back of her neck. She says that it will periodically swell and then she squeezes it, extruding waxy white material. She says it has been present for several years but it bothers her when it swells up. 07/24/2023: The wound has not changed. The skin edges continue to roll inward and the surface is quite fibrotic. She does have an appointment with general surgery coming up to address her sebaceous cyst. Jessica [name]  B ([id number]) [phone].pdf Page 2 of 6 08/17/2023: The wound is about the same. The skin edges are scrolled, but perhaps not as much as in the past. There is slough on the wound surface. She indicated today that she declined to permit me to address the skin edges when I debrided, but would allow me to debride the slough. She  also declines any sort of compressive wrap. 08/24/2023:  No real change to the wound. There is some slough on the surface. The underlying tissue is fibrotic, without any significant granulation tissue formation. 09/01/2023: The wound is smaller today. It still has some depth and a little bit of slough in the base. She declines debridement today. Electronic Signature(s) Signed: 09/01/2023 10:29:23 AM By: Jessica Nest MD FACS Entered By: Jessica [name] on 09/01/2023 07:29:22 -------------------------------------------------------------------------------- Physical Exam Details Patient Name: Date of Service: Jessica [name] Jessica [name] Jessica [name] B. 09/01/2023 9:30 A M Medical Record Number: [id number] Patient Account Number: [id number] Date of Birth/Sex: Treating RN: Dec 08, 1940 (83 y.o. F) Primary Care Provider: Vernon [name] Other Clinician: Referring Provider: Treating Provider/Extender: Jessica [name] Jessica, Jessica [name] in Treatment: 14 Constitutional . . . . no acute distress. Respiratory Normal work of breathing on room air.. Notes 09/01/2023: The wound is smaller today. It still has some depth and a little bit of slough in the base. She declines debridement today. Electronic Signature(s) Signed: 09/01/2023 10:35:28 AM By: Jessica Nest MD FACS Previous Signature: 09/01/2023 10:29:41 AM Version By: Jessica Nest MD FACS Entered By: Jessica [name] on 09/01/2023 07:35:28 -------------------------------------------------------------------------------- Physician Orders Details Patient Name: Date of Service: Jessica [name] Jessica [name] Jessica [name] B. 09/01/2023 9:30 A M Medical Record Number: [id number] Patient Account Number: [id number] Date of Birth/Sex: Treating RN: July 08, 1941 (83 y.o. [name] Merleen Handing Primary Care Provider: Vernon [name] Other Clinician: Referring Provider: Treating Provider/Extender: Jessica [name] Jessica [name] Jessica [name] in Treatment: 6 The following information was scribed by:  Merleen Handing The information was scribed for: Jessica [name] Verbal / Phone Orders: No Diagnosis Coding ICD-10 Coding Code Description [phone] Non-pressure chronic ulcer of other part of left lower leg with fat layer exposed Follow-up Appointments ppointment in 2 [name]. - Dr. Marolyn Return A 1/17 @ 08:45 am Other: - May use KY jelly in place of hydrogel [place], Shaneque  B ([id number]) [id number].pdf Page 3 of 6 Anesthetic (In clinic) Topical Lidocaine  4% applied to wound bed Bathing/ Shower/ Hygiene May shower and wash wound with soap and water . Additional Orders / Instructions Follow Nutritious Diet - Try and increase protein to 70g-100g per day. Wound Treatment Wound #1 - Lower Leg Wound Laterality: Left Cleanser: Soap and Water  Every Other Day/30 Days Discharge Instructions: May shower and wash wound with dial antibacterial soap and water  prior to dressing change. Topical: Skintegrity Hydrogel 4 (oz) Every Other Day/30 Days Discharge Instructions: Apply hydrogel as directed Prim Dressing: Promogran Prisma Matrix, 4.34 (sq in) (silver collagen) Every Other Day/30 Days ary Discharge Instructions: Moisten collagen with saline or hydrogel Secondary Dressing: Zetuvit Plus Silicone Border Dressing 3x3 (in/in) Every Other Day/30 Days Discharge Instructions: Apply silicone border over primary dressing as directed. Electronic Signature(s) Signed: 09/01/2023 12:14:34 PM By: Jessica Nest MD FACS Entered By: Jessica [name] on 09/01/2023 07:38:04 -------------------------------------------------------------------------------- Problem List Details Patient Name: Date of Service: Jessica [name] Jessica [name] Jessica [name] B. 09/01/2023 9:30 A M Medical Record Number: [id number] Patient Account Number: [id number] Date of Birth/Sex: Treating RN: 08/10/1941 (83 y.o. [name] Merleen Handing Primary Care Provider: Vernon [name] Other Clinician: Referring Provider: Treating  Provider/Extender: Jessica [name] Jessica [name] Jessica [name] in Treatment: 35 Active Problems ICD-10 Encounter Code Description Active Date MDM Diagnosis L97.822 Non-pressure chronic ulcer of other part of left lower leg with fat layer exposed9/24/2024 No Yes Inactive Problems ICD-10 Code Description Active Date Inactive Date L72.3 Sebaceous cyst 07/18/2023 07/18/2023 Resolved Problems Electronic Signature(s) Signed: 09/01/2023 10:28:44 AM By: Jessica Nest MD FACS Entered By: Jessica [name] on 09/01/2023 07:28:44 [place], Rashena   B ([id number]) [id number] Page 4 of 6 -------------------------------------------------------------------------------- Progress Note Details Patient Name: Date of Service: Jessica [name] Jessica [name] Jessica [name] Jessica [name] 09/01/2023 9:30 A M Medical Record Number: [id number] Patient Account Number: [id number] Date of Birth/Sex: Treating RN: 1941/02/11 (83 y.o. F) Primary Care Provider: Vernon [name] Other Clinician: Referring Provider: Treating Provider/Extender: Jessica [name] Jessica, Jessica [name] in Treatment: 14 Subjective Chief Complaint Information obtained from Patient Patient seen for complaints of Non-Healing Wound. History of Present Illness (HPI) ADMISSION 05/23/2023 ***PATIENT UNABLE TO TOLERATE CUFFS FOR ABI*** This is a relatively healthy 83 year old woman, non-smoker and nondiabetic, with primarily orthopedic issues in her past medical history. She was referred to the clinic today by her primary care doctor to address a left lower leg wound. The wound apparently occurred in May while she was gardening. She is uncertain exactly how it happened but perhaps struck her leg on a stick or similar protruding structure. She completed a course of both clindamycin and Augmentin based upon a wound swab culture that grew methicillin sensitive Staph aureus. She has been applying some triamcinolone cream that she had from a previous wound care  admission at the [facility] wound [facility] center. 05/30/2023: The wound measured smaller today. It is also cleaner, with just a little bit of slough on the surface. No significant periwound erythema. 06/06/2023: The wound is smaller again today. There is minimal slough on the surface. There is some dry eschar around the wound edges. Edema control is excellent. 06/13/2023: The wound is just the slightest bit smaller today. The skin edges do appear to be starting to roll inward. Edema control is good. 06/20/2023: Once again, the wound measured just the slightest bit smaller today. There is a little bit of slough on the surface. 06/27/2023: The wound measurements are essentially unchanged. The wound surface, however, does appear a bit more robust. 07/04/2023: No significant change to the wound dimensions. The skin edges are rolling inward. There is slough on the surface. Edema control is good. 07/11/2023: The wound measured slightly smaller today. The skin edges continue to roll inward. 07/18/2023: The wound measured smaller today. There is some slough accumulation on the surface. She is also complaining about a spot on the back of her neck. She says that it will periodically swell and then she squeezes it, extruding waxy white material. She says it has been present for several years but it bothers her when it swells up. 07/24/2023: The wound has not changed. The skin edges continue to roll inward and the surface is quite fibrotic. She does have an appointment with general surgery coming up to address her sebaceous cyst. 08/17/2023: The wound is about the same. The skin edges are scrolled, but perhaps not as much as in the past. There is slough on the wound surface. She indicated today that she declined to permit me to address the skin edges when I debrided, but would allow me to debride the slough. She also declines any sort of compressive wrap. 08/24/2023: No real change to the wound. There is some slough  on the surface. The underlying tissue is fibrotic, without any significant granulation tissue formation. 09/01/2023: The wound is smaller today. It still has some depth and a little bit of slough in the base. She declines debridement today. Objective Constitutional no acute distress. Vitals Time Taken: 9:37 AM, Height: 60 in, Weight: 98 lbs, BMI: 19.1, Temperature: 97.5 F, Pulse: 69 bpm, Respiratory Rate: 18 breaths/min, Blood Pressure: 136/72 mmHg. Respiratory Normal work of breathing on room air.SABRA  General Notes: 09/01/2023: The wound is smaller today. It still has some depth and a little bit of slough in the base. She declines debridement today. Integumentary (Hair, Skin) Tieu, Amamda  B ([id number]) [phone].pdf Page 5 of 6 Wound #1 status is Open. Original cause of wound was Trauma. The date acquired was: 12/28/2022. The wound has been in treatment 14 [name]. The wound is located on the Left Lower Leg. The wound measures 0.3cm length x 0.2cm width x 0.2cm depth; 0.047cm^2 area and 0.009cm^3 volume. There is Fat Layer (Subcutaneous Tissue) exposed. There is no tunneling or undermining noted. There is a small amount of serous drainage noted. The wound margin is epibole. There is large (67-100%) pink granulation within the wound bed. There is no necrotic tissue within the wound bed. The periwound skin appearance had no abnormalities noted for texture. The periwound skin appearance had no abnormalities noted for moisture. The periwound skin appearance had no abnormalities noted for color. Periwound temperature was noted as No Abnormality. Assessment Active Problems ICD-10 Non-pressure chronic ulcer of other part of left lower leg with fat layer exposed Plan Follow-up Appointments: Return Appointment in 2 [name]. - Dr. Marolyn 1/17 @ 08:45 am Other: - May use KY jelly in place of hydrogel Anesthetic: (In clinic) Topical Lidocaine  4% applied to wound bed Bathing/  Shower/ Hygiene: May shower and wash wound with soap and water . Additional Orders / Instructions: Follow Nutritious Diet - Try and increase protein to 70g-100g per day. WOUND #1: - Lower Leg Wound Laterality: Left Cleanser: Soap and Water  Every Other Day/30 Days Discharge Instructions: May shower and wash wound with dial antibacterial soap and water  prior to dressing change. Topical: Skintegrity Hydrogel 4 (oz) Every Other Day/30 Days Discharge Instructions: Apply hydrogel as directed Prim Dressing: Promogran Prisma Matrix, 4.34 (sq in) (silver collagen) Every Other Day/30 Days ary Discharge Instructions: Moisten collagen with saline or hydrogel Secondary Dressing: Zetuvit Plus Silicone Border Dressing 3x3 (in/in) Every Other Day/30 Days Discharge Instructions: Apply silicone border over primary dressing as directed. 09/01/2023: The wound is smaller today. It still has some depth and a little bit of slough in the base. She declines debridement today. I was able to use a Q-tip and some gauze to remove the slough from the wound without resorting to sharp debridement. We will continue Prisma silver collagen moistened with hydrogel with a foam border dressing. We are going to extend her visit interval to 2 [name]; I will see her then. Electronic Signature(s) Signed: 09/01/2023 10:38:46 AM By: Jessica Nest MD FACS Entered By: Jessica [name] on 09/01/2023 07:38:46 -------------------------------------------------------------------------------- SuperBill Details Patient Name: Date of Service: Jessica [name] Jessica [name] Jessica [name] Jessica [name] 09/01/2023 Medical Record Number: [id number] Patient Account Number: [id number] Date of Birth/Sex: Treating RN: 09-26-1940 (83 y.o. [name] Merleen Handing Primary Care Provider: Vernon [name] Other Clinician: Referring Provider: Treating Provider/Extender: Jessica [name] Jessica, Jessica [name] in Treatment: 14 Diagnosis Coding ICD-10 Codes Code Description [phone] Non-pressure  chronic ulcer of other part of left lower leg with fat layer exposed Morrone, Jeannifer  B ([id number]) [phone].pdf Page 6 of 6 Facility Procedures : CPT4 Code: [id number] Description: 99213 - WOUND CARE VISIT-LEV 3 EST PT Modifier: Quantity: 1 Physician Procedures : CPT4 Code Description Modifier [id number] - WC PHYS LEVEL 3 - EST PT ICD-10 Diagnosis Description L97.822 Non-pressure chronic ulcer of other part of left lower leg with fat layer exposed Quantity: 1 Electronic Signature(s) Signed: 09/01/2023 10:38:55 AM By: Jessica Nest MD FACS Entered By: Jessica [name] on 09/01/2023 07:38:55

## 2023-09-08 ENCOUNTER — Ambulatory Visit (HOSPITAL_BASED_OUTPATIENT_CLINIC_OR_DEPARTMENT_OTHER): Payer: Medicare PPO | Admitting: General Surgery

## 2023-09-15 ENCOUNTER — Encounter (HOSPITAL_BASED_OUTPATIENT_CLINIC_OR_DEPARTMENT_OTHER): Payer: Medicare PPO | Admitting: General Surgery

## 2023-09-15 DIAGNOSIS — S81802A Unspecified open wound, left lower leg, initial encounter: Secondary | ICD-10-CM | POA: Diagnosis not present

## 2023-09-15 DIAGNOSIS — L97822 Non-pressure chronic ulcer of other part of left lower leg with fat layer exposed: Secondary | ICD-10-CM | POA: Diagnosis not present

## 2023-09-15 NOTE — Progress Notes (Signed)
CHANE, GERRITS B ([id number]) [id number] Page 1 of 7 Visit Report for 09/15/2023 Chief Complaint Document Details Patient Name: Date of Service: Jessica [name] 09/15/2023 8:45 A M Medical Record Number: [id number] Patient Account Number: [id number] Date of Birth/Sex: Treating RN: 09-Jun-1941 (83 y.o. F) Primary Care Provider: Ardean Larsen Other Clinician: Referring Provider: Treating Provider/Extender: Juventino Slovak, Rinka Weeks in Treatment: 16 Information Obtained from: Patient Chief Complaint Patient seen for complaints of Non-Healing Wound. Electronic Signature(s) Signed: 09/15/2023 9:25:41 AM By: Duanne Guess MD FACS Entered By: Duanne Guess on 09/15/2023 09:25:41 -------------------------------------------------------------------------------- Debridement Details Patient Name: Date of Service: Jessica Sos B. 09/15/2023 8:45 A M Medical Record Number: [id number] Patient Account Number: [id number] Date of Birth/Sex: Treating RN: 1941/08/07 (83 y.o. F) Primary Care Provider: Ardean Larsen Other Clinician: Referring Provider: Treating Provider/Extender: Juventino Slovak, Rinka Weeks in Treatment: 16 Debridement Performed for Assessment: Wound #1 Left Lower Leg Performed By: Physician Duanne Guess, MD Debridement Type: Debridement Level of Consciousness (Pre-procedure): Awake and Alert Pre-procedure Verification/Time Out Yes - 09:22 Taken: Start Time: 09:24 Pain Control: Lidocaine Percent of Wound Bed Debrided: 100% T Area Debrided (cm): otal 0.07 Tissue and other material debrided: Non-Viable, Slough, Biofilm, Slough Level: Non-Viable Tissue Debridement Description: Selective/Open Wound Instrument: Curette Bleeding: None End Time: 09:26 Procedural Pain: 3 Post Procedural Pain: 1 Response to Treatment: Procedure was tolerated well Level of Consciousness (Post- Awake and  Alert procedure): Post Debridement Measurements of Total Wound Length: (cm) 0.3 Width: (cm) 0.3 Depth: (cm) 0.2 Volume: (cm) 0.014 Character of Wound/Ulcer Post Debridement: Improved Post Procedure Diagnosis JOZETTE, [name] ([id number]) [id number] Page 2 of 7 Same as Pre-procedure Electronic Signature(s) Signed: 09/15/2023 9:25:36 AM By: Duanne Guess MD FACS Entered By: Duanne Guess on 09/15/2023 09:25:36 -------------------------------------------------------------------------------- HPI Details Patient Name: Date of Service: Jessica Sos B. 09/15/2023 8:45 A M Medical Record Number: [id number] Patient Account Number: [id number] Date of Birth/Sex: Treating RN: 1941/02/17 (83 y.o. F) Primary Care Provider: Ardean Larsen Other Clinician: Referring Provider: Treating Provider/Extender: Juventino Slovak, Rinka Weeks in Treatment: 16 History of Present Illness HPI Description: ADMISSION 05/23/2023 ***PATIENT UNABLE TO TOLERATE CUFFS FOR ABI*** This is a relatively healthy 83 year old woman, non-smoker and nondiabetic, with primarily orthopedic issues in her past medical history. She was referred to the clinic today by her primary care doctor to address a left lower leg wound. The wound apparently occurred in May while she was gardening. She is uncertain exactly how it happened but perhaps struck her leg on a stick or similar protruding structure. She completed a course of both clindamycin and Augmentin based upon a wound swab culture that grew methicillin sensitive Staph aureus. She has been applying some triamcinolone cream that she had from a previous wound care admission at the [facility] wound care center. 05/30/2023: The wound measured smaller today. It is also cleaner, with just a little bit of slough on the surface. No significant periwound erythema. 06/06/2023: The wound is smaller again today. There is minimal slough on the  surface. There is some dry eschar around the wound edges. Edema control is excellent. 06/13/2023: The wound is just the slightest bit smaller today. The skin edges do appear to be starting to roll inward. Edema control is good. 06/20/2023: Once again, the wound measured just the slightest bit smaller today. There is a little bit of slough on the surface. 06/27/2023: The wound measurements are essentially unchanged. The wound surface, however, does appear  a bit more robust. 07/04/2023: No significant change to the wound dimensions. The skin edges are rolling inward. There is slough on the surface. Edema control is good. 07/11/2023: The wound measured slightly smaller today. The skin edges continue to roll inward. 07/18/2023: The wound measured smaller today. There is some slough accumulation on the surface. She is also complaining about a spot on the back of her neck. She says that it will periodically swell and then she squeezes it, extruding waxy white material. She says it has been present for several years but it bothers her when it swells up. 07/24/2023: The wound has not changed. The skin edges continue to roll inward and the surface is quite fibrotic. She does have an appointment with general surgery coming up to address her sebaceous cyst. 08/17/2023: The wound is about the same. The skin edges are scrolled, but perhaps not as much as in the past. There is slough on the wound surface. She indicated today that she declined to permit me to address the skin edges when I debrided, but would allow me to debride the slough. She also declines any sort of compressive wrap. 08/24/2023: No real change to the wound. There is some slough on the surface. The underlying tissue is fibrotic, without any significant granulation tissue formation. 09/01/2023: The wound is smaller today. It still has some depth and a little bit of slough in the base. She declines debridement today. 09/15/2023: The wound is a little  bit smaller. The skin edges continue to roll inward. There is some slough at the base. Electronic Signature(s) Signed: 09/15/2023 9:26:23 AM By: Duanne Guess MD FACS Entered By: Duanne Guess on 09/15/2023 16:10:96 Jessica [name] ([id number]) [id number] Page 3 of 7 -------------------------------------------------------------------------------- Physical Exam Details Patient Name: Date of Service: Jessica [name] 09/15/2023 8:45 A M Medical Record Number: [id number] Patient Account Number: [id number] Date of Birth/Sex: Treating RN: 02-21-1941 (83 y.o. F) Primary Care Provider: Ardean Larsen Other Clinician: Referring Provider: Treating Provider/Extender: Juventino Slovak, Rinka Weeks in Treatment: 16 Constitutional Slightly hypertensive. . . . no acute distress. Respiratory Normal work of breathing on room air.. Notes 09/15/2023: The wound is a little bit smaller. The skin edges continue to roll inward. There is some slough at the base. Electronic Signature(s) Signed: 09/15/2023 9:27:05 AM By: Duanne Guess MD FACS Entered By: Duanne Guess on 09/15/2023 09:27:05 -------------------------------------------------------------------------------- Physician Orders Details Patient Name: Date of Service: Jessica Sos B. 09/15/2023 8:45 A M Medical Record Number: [id number] Patient Account Number: [id number] Date of Birth/Sex: Treating RN: July 03, 1941 (83 y.o. Tommye Standard Primary Care Provider: Ardean Larsen Other Clinician: Referring Provider: Treating Provider/Extender: Alfonse Ras in Treatment: 67 The following information was scribed by: Zenaida Deed The information was scribed for: Duanne Guess Verbal / Phone Orders: No Diagnosis Coding ICD-10 Coding Code Description [phone] Non-pressure chronic ulcer of other part of left lower leg with fat layer exposed Follow-up  Appointments ppointment in 2 weeks. - Dr. Lady Gary Return A Other: - May use KY jelly in place of hydrogel Anesthetic (In clinic) Topical Lidocaine 4% applied to wound bed Bathing/ Shower/ Hygiene May shower and wash wound with soap and water. Additional Orders / Instructions Follow Nutritious Diet - Try and increase protein to 70g-100g per day. Wound Treatment Wound #1 - Lower Leg Wound Laterality: Left Cleanser: Soap and Water Every Other Day/30 Days Discharge Instructions: May shower and wash wound with dial antibacterial soap and water  prior to dressing change. Topical: Skintegrity Hydrogel 4 (oz) Every Other Day/30 Days Discharge Instructions: Apply hydrogel as directed Prim Dressing: Promogran Prisma Matrix, 4.34 (sq in) (silver collagen) Every Other Day/30 Days ary Discharge Instructions: Moisten collagen with saline or hydrogel Secondary Dressing: Zetuvit Plus Silicone Border Dressing 3x3 (in/in) Every Other Day/30 Days Discharge Instructions: Apply silicone border over primary dressing as directed. ELEONORA, PERUZZI B ([id number]) [id number] Page 4 of 7 Electronic Signature(s) Signed: 09/15/2023 9:32:11 AM By: Duanne Guess MD FACS Entered By: Duanne Guess on 09/15/2023 09:27:20 -------------------------------------------------------------------------------- Problem List Details Patient Name: Date of Service: Jessica Sos B. 09/15/2023 8:45 A M Medical Record Number: [id number] Patient Account Number: [id number] Date of Birth/Sex: Treating RN: Sep 06, 1940 (83 y.o. Tommye Standard Primary Care Provider: Ardean Larsen Other Clinician: Referring Provider: Treating Provider/Extender: Juventino Slovak, Tacey Ruiz in Treatment: 59 Active Problems ICD-10 Encounter Code Description Active Date MDM Diagnosis L97.822 Non-pressure chronic ulcer of other part of left lower leg with fat layer exposed9/24/2024 No Yes Inactive  Problems ICD-10 Code Description Active Date Inactive Date L72.3 Sebaceous cyst 07/18/2023 07/18/2023 Resolved Problems Electronic Signature(s) Signed: 09/15/2023 9:23:31 AM By: Duanne Guess MD FACS Entered By: Duanne Guess on 09/15/2023 09:23:31 -------------------------------------------------------------------------------- Progress Note Details Patient Name: Date of Service: Jessica Sos B. 09/15/2023 8:45 A M Medical Record Number: [id number] Patient Account Number: [id number] Date of Birth/Sex: Treating RN: Feb 18, 1941 (83 y.o. F) Primary Care Provider: Ardean Larsen Other Clinician: Referring Provider: Treating Provider/Extender: Juventino Slovak, Rinka Weeks in Treatment: 16 Subjective Chief Complaint Information obtained from Patient Patient seen for complaints of Non-Healing Wound. History of Present Illness (HPI) ADMISSION MERIT, ALMEDA B ([id number]) [id number] Page 5 of 7 05/23/2023 ***PATIENT UNABLE TO TOLERATE CUFFS FOR ABI*** This is a relatively healthy 83 year old woman, non-smoker and nondiabetic, with primarily orthopedic issues in her past medical history. She was referred to the clinic today by her primary care doctor to address a left lower leg wound. The wound apparently occurred in May while she was gardening. She is uncertain exactly how it happened but perhaps struck her leg on a stick or similar protruding structure. She completed a course of both clindamycin and Augmentin based upon a wound swab culture that grew methicillin sensitive Staph aureus. She has been applying some triamcinolone cream that she had from a previous wound care admission at the [facility] wound care center. 05/30/2023: The wound measured smaller today. It is also cleaner, with just a little bit of slough on the surface. No significant periwound erythema. 06/06/2023: The wound is smaller again today. There is minimal slough on the  surface. There is some dry eschar around the wound edges. Edema control is excellent. 06/13/2023: The wound is just the slightest bit smaller today. The skin edges do appear to be starting to roll inward. Edema control is good. 06/20/2023: Once again, the wound measured just the slightest bit smaller today. There is a little bit of slough on the surface. 06/27/2023: The wound measurements are essentially unchanged. The wound surface, however, does appear a bit more robust. 07/04/2023: No significant change to the wound dimensions. The skin edges are rolling inward. There is slough on the surface. Edema control is good. 07/11/2023: The wound measured slightly smaller today. The skin edges continue to roll inward. 07/18/2023: The wound measured smaller today. There is some slough accumulation on the surface. She is also complaining about a spot on the back of her neck. She says that it  will periodically swell and then she squeezes it, extruding waxy white material. She says it has been present for several years but it bothers her when it swells up. 07/24/2023: The wound has not changed. The skin edges continue to roll inward and the surface is quite fibrotic. She does have an appointment with general surgery coming up to address her sebaceous cyst. 08/17/2023: The wound is about the same. The skin edges are scrolled, but perhaps not as much as in the past. There is slough on the wound surface. She indicated today that she declined to permit me to address the skin edges when I debrided, but would allow me to debride the slough. She also declines any sort of compressive wrap. 08/24/2023: No real change to the wound. There is some slough on the surface. The underlying tissue is fibrotic, without any significant granulation tissue formation. 09/01/2023: The wound is smaller today. It still has some depth and a little bit of slough in the base. She declines debridement today. 09/15/2023: The wound is a little  bit smaller. The skin edges continue to roll inward. There is some slough at the base. Objective Constitutional Slightly hypertensive. no acute distress. Vitals Time Taken: 8:38 AM, Height: 60 in, Weight: 98 lbs, BMI: 19.1, Temperature: 97.5 F, Pulse: 69 bpm, Respiratory Rate: 18 breaths/min, Blood Pressure: 142/86 mmHg. Respiratory Normal work of breathing on room air.. General Notes: 09/15/2023: The wound is a little bit smaller. The skin edges continue to roll inward. There is some slough at the base. Integumentary (Hair, Skin) Wound #1 status is Open. Original cause of wound was Trauma. The date acquired was: 12/28/2022. The wound has been in treatment 16 weeks. The wound is located on the Left Lower Leg. The wound measures 0.3cm length x 0.2cm width x 0.2cm depth; 0.047cm^2 area and 0.009cm^3 volume. There is Fat Layer (Subcutaneous Tissue) exposed. There is no tunneling or undermining noted. There is a small amount of serous drainage noted. The wound margin is epibole. There is small (1-33%) pink granulation within the wound bed. There is a large (67-100%) amount of necrotic tissue within the wound bed including Adherent Slough. The periwound skin appearance had no abnormalities noted for texture. The periwound skin appearance had no abnormalities noted for moisture. The periwound skin appearance had no abnormalities noted for color. Periwound temperature was noted as No Abnormality. Assessment Active Problems ICD-10 Non-pressure chronic ulcer of other part of left lower leg with fat layer exposed Procedures XYLA, KNITTER B ([id number]) [id number] Page 6 of 7 Wound #1 Pre-procedure diagnosis of Wound #1 is a Trauma, Other located on the Left Lower Leg . There was a Selective/Open Wound Non-Viable Tissue Debridement with a total area of 0.07 sq cm performed by Duanne Guess, MD. With the following instrument(s): Curette to remove Non-Viable  tissue/material. Material removed includes Slough and Biofilm and after achieving pain control using Lidocaine. No specimens were taken. A time out was conducted at 09:22, prior to the start of the procedure. There was no bleeding. The procedure was tolerated well with a pain level of 3 throughout and a pain level of 1 following the procedure. Post Debridement Measurements: 0.3cm length x 0.3cm width x 0.2cm depth; 0.014cm^3 volume. Character of Wound/Ulcer Post Debridement is improved. Post procedure Diagnosis Wound #1: Same as Pre-Procedure Plan Follow-up Appointments: Return Appointment in 2 weeks. - Dr. Lady Gary Other: - May use KY jelly in place of hydrogel Anesthetic: (In clinic) Topical Lidocaine 4% applied to wound bed Bathing/ Shower/ Hygiene:  May shower and wash wound with soap and water. Additional Orders / Instructions: Follow Nutritious Diet - Try and increase protein to 70g-100g per day. WOUND #1: - Lower Leg Wound Laterality: Left Cleanser: Soap and Water Every Other Day/30 Days Discharge Instructions: May shower and wash wound with dial antibacterial soap and water prior to dressing change. Topical: Skintegrity Hydrogel 4 (oz) Every Other Day/30 Days Discharge Instructions: Apply hydrogel as directed Prim Dressing: Promogran Prisma Matrix, 4.34 (sq in) (silver collagen) Every Other Day/30 Days ary Discharge Instructions: Moisten collagen with saline or hydrogel Secondary Dressing: Zetuvit Plus Silicone Border Dressing 3x3 (in/in) Every Other Day/30 Days Discharge Instructions: Apply silicone border over primary dressing as directed. 09/15/2023: The wound is a little bit smaller. The skin edges continue to roll inward. There is some slough at the base. I made a makeshift curette, as the wound is too small for even a #3 curette, and used it to debride the slough off of the wound surface. The patient declined any further debridement. We will continue Prisma silver collagen  moistened with hydrogel with a foam border dressing. She will follow-up in 2 weeks. Electronic Signature(s) Signed: 09/15/2023 9:28:28 AM By: Duanne Guess MD FACS Entered By: Duanne Guess on 09/15/2023 01:02:72 -------------------------------------------------------------------------------- SuperBill Details Patient Name: Date of Service: Jessica [name] 09/15/2023 Medical Record Number: [id number] Patient Account Number: [id number] Date of Birth/Sex: Treating RN: 1940-10-23 (82 y.o. F) Primary Care Provider: Ardean Larsen Other Clinician: Referring Provider: Treating Provider/Extender: Juventino Slovak, Rinka Weeks in Treatment: 16 Diagnosis Coding ICD-10 Codes Code Description [phone] Non-pressure chronic ulcer of other part of left lower leg with fat layer exposed Facility Procedures Physician Procedures : CPT4 Code Description Modifier [id number] - WC PHYS DEBR WO ANESTH 20 SQ CM ICD-10 Diagnosis Description L97.822 Non-pressure chronic ulcer of other part of left lower leg with fat layer exposed Quantity: 1 Electronic Signature(s) Signed: 09/15/2023 9:28:51 AM By: Duanne Guess MD FACS Entered By: Duanne Guess on 09/15/2023 09:28:51

## 2023-09-15 NOTE — Progress Notes (Signed)
MARISLEYSIS, ROGGENKAMP B ([id number]) [id number] Page 1 of 8 Visit Report for 09/15/2023 Arrival Information Details Patient Name: Date of Service: Jessica [name] 09/15/2023 8:45 A M Medical Record Number: [id number] Patient Account Number: [id number] Date of Birth/Sex: Treating RN: 1940/09/27 (83 y.o. F) Primary Care Tanee Henery: Ardean Larsen Other Clinician: Referring Lesslie Mossa: Treating Nalayah Hitt/Extender: Juventino Slovak, Rinka Weeks in Treatment: 16 Visit Information History Since Last Visit Added or deleted any medications: No Patient Arrived: Cane Any new allergies or adverse reactions: No Arrival Time: 08:38 Had a fall or experienced change in No Accompanied By: self activities of daily living that may affect Transfer Assistance: None risk of falls: Patient Identification Verified: Yes Signs or symptoms of abuse/neglect since last visito No Secondary Verification Process Completed: Yes Hospitalized since last visit: No Patient Requires Transmission-Based Precautions: No Implantable device outside of the clinic excluding No Patient Has Alerts: Yes cellular tissue based products placed in the center Patient Alerts: Unable to complete ABI L since last visit: Has Dressing in Place as Prescribed: Yes Pain Present Now: No Electronic Signature(s) Signed: 09/15/2023 1:37:16 PM By: Zenaida Deed RN, BSN Entered By: Zenaida Deed on 09/15/2023 09:11:17 -------------------------------------------------------------------------------- Clinic Level of Care Assessment Details Patient Name: Date of Service: Jessica Sos B. 09/15/2023 8:45 A M Medical Record Number: [id number] Patient Account Number: [id number] Date of Birth/Sex: Treating RN: 11-13-1940 (83 y.o. Tommye Standard Primary Care Fawzi Melman: Ardean Larsen Other Clinician: Referring Ryott Rafferty: Treating Bevelyn Arriola/Extender: Alfonse Ras in Treatment:  16 Clinic Level of Care Assessment Items TOOL 4 Quantity Score []  - 0 Use when only an EandM is performed on FOLLOW-UP visit ASSESSMENTS - Nursing Assessment / Reassessment X- 1 10 Reassessment of Co-morbidities (includes updates in patient status) X- 1 5 Reassessment of Adherence to Treatment Plan ASSESSMENTS - Wound and Skin A ssessment / Reassessment X - Simple Wound Assessment / Reassessment - one wound 1 5 []  - 0 Complex Wound Assessment / Reassessment - multiple wounds []  - 0 Dermatologic / Skin Assessment (not related to wound area) ASSESSMENTS - Focused Assessment []  - 0 Circumferential Edema Measurements - multi extremities []  - 0 Nutritional Assessment / Counseling / Intervention AMIRE, ABRAM B ([id number]) [phone] Page 2 of 8 X- 1 5 Lower Extremity Assessment (monofilament, tuning fork, pulses) []  - 0 Peripheral Arterial Disease Assessment (using hand held doppler) ASSESSMENTS - Ostomy and/or Continence Assessment and Care []  - 0 Incontinence Assessment and Management []  - 0 Ostomy Care Assessment and Management (repouching, etc.) PROCESS - Coordination of Care X - Simple Patient / Family Education for ongoing care 1 15 []  - 0 Complex (extensive) Patient / Family Education for ongoing care X- 1 10 Staff obtains Chiropractor, Records, T Results / Process Orders est []  - 0 Staff telephones HHA, Nursing Homes / Clarify orders / etc []  - 0 Routine Transfer to another Facility (non-emergent condition) []  - 0 Routine Hospital Admission (non-emergent condition) []  - 0 New Admissions / Manufacturing engineer / Ordering NPWT Apligraf, etc. , []  - 0 Emergency Hospital Admission (emergent condition) X- 1 10 Simple Discharge Coordination []  - 0 Complex (extensive) Discharge Coordination PROCESS - Special Needs []  - 0 Pediatric / Minor Patient Management []  - 0 Isolation Patient Management []  - 0 Hearing / Language / Visual  special needs []  - 0 Assessment of Community assistance (transportation, D/C planning, etc.) []  - 0 Additional assistance / Altered mentation []  - 0 Support Surface(s) Assessment (bed, cushion,  seat, etc.) INTERVENTIONS - Wound Cleansing / Measurement X - Simple Wound Cleansing - one wound 1 5 []  - 0 Complex Wound Cleansing - multiple wounds X- 1 5 Wound Imaging (photographs - any number of wounds) []  - 0 Wound Tracing (instead of photographs) X- 1 5 Simple Wound Measurement - one wound []  - 0 Complex Wound Measurement - multiple wounds INTERVENTIONS - Wound Dressings X - Small Wound Dressing one or multiple wounds 1 10 []  - 0 Medium Wound Dressing one or multiple wounds []  - 0 Large Wound Dressing one or multiple wounds []  - 0 Application of Medications - topical []  - 0 Application of Medications - injection INTERVENTIONS - Miscellaneous []  - 0 External ear exam []  - 0 Specimen Collection (cultures, biopsies, blood, body fluids, etc.) []  - 0 Specimen(s) / Culture(s) sent or taken to Lab for analysis []  - 0 Patient Transfer (multiple staff / Nurse, adult / Similar devices) []  - 0 Simple Staple / Suture removal (25 or less) []  - 0 Complex Staple / Suture removal (26 or more) []  - 0 Hypo / Hyperglycemic Management (close monitor of Blood Glucose) TANNISHA, MENDES B ([id number]) [id number] Page 3 of 8 []  - 0 Ankle / Brachial Index (ABI) - do not check if billed separately X- 1 5 Vital Signs Has the patient been seen at the hospital within the last three years: Yes Total Score: 90 Level Of Care: New/Established - Level 3 Electronic Signature(s) Signed: 09/15/2023 1:37:16 PM By: Zenaida Deed RN, BSN Entered By: Zenaida Deed on 09/15/2023 01:60:10 -------------------------------------------------------------------------------- Encounter Discharge Information Details Patient Name: Date of Service: Jessica Sos B. 09/15/2023  8:45 A M Medical Record Number: [id number] Patient Account Number: [id number] Date of Birth/Sex: Treating RN: 1940-09-04 (83 y.o. Tommye Standard Primary Care Mansour Balboa: Ardean Larsen Other Clinician: Referring Hortencia Martire: Treating Angad Nabers/Extender: Alfonse Ras in Treatment: 52 Encounter Discharge Information Items Post Procedure Vitals Discharge Condition: Stable Temperature (F): 97.5 Ambulatory Status: Cane Pulse (bpm): 69 Discharge Destination: Home Respiratory Rate (breaths/min): 18 Transportation: Private Auto Blood Pressure (mmHg): 142/86 Accompanied By: self Schedule Follow-up Appointment: Yes Clinical Summary of Care: Patient Declined Electronic Signature(s) Signed: 09/15/2023 1:37:16 PM By: Zenaida Deed RN, BSN Entered By: Zenaida Deed on 09/15/2023 09:30:04 -------------------------------------------------------------------------------- Lower Extremity Assessment Details Patient Name: Date of Service: Jessica Sos B. 09/15/2023 8:45 A M Medical Record Number: [id number] Patient Account Number: [id number] Date of Birth/Sex: Treating RN: June 27, 1941 (83 y.o. Tommye Standard Primary Care Amit Leece: Ardean Larsen Other Clinician: Referring Ermalee Mealy: Treating Myleen Brailsford/Extender: Juventino Slovak, Rinka Weeks in Treatment: 16 Edema Assessment Assessed: [Left: No] [Right: No] Edema: [Left: N] [Right: o] Calf Left: Right: Point of Measurement: 30 cm From Medial Instep 29 cm Ankle Left: Right: Point of Measurement: 9 cm From Medial Instep 17 cm Vascular Assessment YVAINE, SCHRADE B ([id number]) [Right:[id number] Page 4 of 8] Pulses: Dorsalis Pedis Palpable: [Left:Yes] Extremity colors, hair growth, and conditions: Extremity Color: [Left:Normal] Hair Growth on Extremity: [Left:No] Temperature of Extremity: [Left:Warm] Capillary Refill: [Left:< 3 seconds] Dependent Rubor: [Left:No  No] Electronic Signature(s) Signed: 09/15/2023 1:37:16 PM By: Zenaida Deed RN, BSN Entered By: Zenaida Deed on 09/15/2023 09:14:11 -------------------------------------------------------------------------------- Multi Wound Chart Details Patient Name: Date of Service: Jessica Sos B. 09/15/2023 8:45 A M Medical Record Number: [id number] Patient Account Number: [id number] Date of Birth/Sex: Treating RN: [birth year] (83 y.o. F) Primary Care Loella Hickle: Ardean Larsen Other Clinician: Referring Tersea Aulds: Treating Jannelly Bergren/Extender: Duanne Guess  Pahwani, Rinka Weeks in Treatment: 16 Vital Signs Height(in): 60 Pulse(bpm): 69 Weight(lbs): 98 Blood Pressure(mmHg): 142/86 Body Mass Index(BMI): 19.1 Temperature(F): 97.5 Respiratory Rate(breaths/min): 18 [1:Photos:] [N/A:N/A] Left Lower Leg N/A N/A Wound Location: Trauma N/A N/A Wounding Event: Trauma, Other N/A N/A Primary Etiology: Glaucoma, Hypertension N/A N/A Comorbid History: 12/28/2022 N/A N/A Date Acquired: 69 N/A N/A Weeks of Treatment: Open N/A N/A Wound Status: No N/A N/A Wound Recurrence: 0.3x0.2x0.2 N/A N/A Measurements L x W x D (cm) 0.047 N/A N/A A (cm) : rea 0.009 N/A N/A Volume (cm) : 95.60% N/A N/A % Reduction in A rea: 91.50% N/A N/A % Reduction in Volume: Full Thickness Without Exposed N/A N/A Classification: Support Structures Small N/A N/A Exudate Amount: Serous N/A N/A Exudate Type: amber N/A N/A Exudate Color: Epibole N/A N/A Wound Margin: Small (1-33%) N/A N/A Granulation Amount: Pink N/A N/A Granulation Quality: Large (67-100%) N/A N/A Necrotic Amount: Fat Layer (Subcutaneous Tissue): Yes N/A N/A Exposed Structures: Fascia: No Tendon: No Muscle: No Joint: No XYLIA, KARPOWICH B ([id number]) [id number] Page 5 of 8 Bone: No Small (1-33%) N/A N/A Epithelialization: No Abnormalities Noted N/A N/A Periwound Skin Texture: Maceration: No  N/A N/A Periwound Skin Moisture: Dry/Scaly: No No Abnormalities Noted N/A N/A Periwound Skin Color: No Abnormality N/A N/A Temperature: Treatment Notes Electronic Signature(s) Signed: 09/15/2023 9:23:44 AM By: Duanne Guess MD FACS Entered By: Duanne Guess on 09/15/2023 09:23:44 -------------------------------------------------------------------------------- Multi-Disciplinary Care Plan Details Patient Name: Date of Service: Jessica Sos B. 09/15/2023 8:45 A M Medical Record Number: [id number] Patient Account Number: [id number] Date of Birth/Sex: Treating RN: Apr 12, 1941 (83 y.o. Tommye Standard Primary Care Aasia Peavler: Ardean Larsen Other Clinician: Referring Thamar Holik: Treating Korynne Dols/Extender: Alfonse Ras in Treatment: 16 Multidisciplinary Care Plan reviewed with physician Active Inactive Wound/Skin Impairment Nursing Diagnoses: Impaired tissue integrity Goals: Patient/caregiver will verbalize understanding of skin care regimen Date Initiated: 05/23/2023 Target Resolution Date: 09/28/2023 Goal Status: Active Interventions: Assess ulceration(s) every visit Treatment Activities: Skin care regimen initiated : 05/23/2023 Notes: Electronic Signature(s) Signed: 09/15/2023 1:37:16 PM By: Zenaida Deed RN, BSN Entered By: Zenaida Deed on 09/15/2023 09:15:25 -------------------------------------------------------------------------------- Pain Assessment Details Patient Name: Date of Service: Jessica Sos B. 09/15/2023 8:45 A M Medical Record Number: [id number] Patient Account Number: [id number] Date of Birth/Sex: Treating RN: Jun 21, 1941 (83 y.o. F) Primary Care Lucelia Lacey: Ardean Larsen Other Clinician: Referring Finnick Orosz: Treating Delena Casebeer/Extender: Juventino Slovak, Rinka Weeks in Treatment: [address], [place] B ([id number]) [id number] Page 6 of 8 Active Problems Location of Pain  Severity and Description of Pain Patient Has Paino No Site Locations Rate the pain. Current Pain Level: 0 Pain Management and Medication Current Pain Management: Electronic Signature(s) Signed: 09/15/2023 1:37:16 PM By: Zenaida Deed RN, BSN Entered By: Zenaida Deed on 09/15/2023 09:11:35 -------------------------------------------------------------------------------- Patient/Caregiver Education Details Patient Name: Date of Service: Jessica [name] 1/17/2025andnbsp8:45 A M Medical Record Number: [id number] Patient Account Number: [id number] Date of Birth/Gender: Treating RN: 09/14/1940 (83 y.o. Tommye Standard Primary Care Physician: Ardean Larsen Other Clinician: Referring Physician: Treating Physician/Extender: Alfonse Ras in Treatment: 16 Education Assessment Education Provided To: Patient Education Topics Provided Wound/Skin Impairment: Methods: Explain/Verbal Responses: Reinforcements needed, State content correctly [employer]) Signed: 09/15/2023 1:37:16 PM By: Zenaida Deed RN, BSN Entered By: Zenaida Deed on 09/15/2023 09:15:42 [name] ([id number]) [id number] Page 7 of 8 -------------------------------------------------------------------------------- Wound Assessment Details Patient Name: Date of Service: Jessica [name], Jessica IRGINIA B. 09/15/2023  8:45 A M Medical Record Number: [id number] Patient Account Number: [id number] Date of Birth/Sex: Treating RN: February 26, 1941 (83 y.o. Tommye Standard Primary Care Azra Abrell: Ardean Larsen Other Clinician: Referring Matisha Termine: Treating Maura Braaten/Extender: Juventino Slovak, Rinka Weeks in Treatment: 16 Wound Status Wound Number: 1 Primary Etiology: Trauma, Other Wound Location: Left Lower Leg Wound Status: Open Wounding Event: Trauma Comorbid History: Glaucoma, Hypertension Date Acquired: 12/28/2022 Weeks Of Treatment:  16 Clustered Wound: No Photos Wound Measurements Length: (cm) 0.3 Width: (cm) 0.2 Depth: (cm) 0.2 Area: (cm) 0.047 Volume: (cm) 0.009 % Reduction in Area: 95.6% % Reduction in Volume: 91.5% Epithelialization: Small (1-33%) Tunneling: No Undermining: No Wound Description Classification: Full Thickness Without Exposed Support Structures Wound Margin: Epibole Exudate Amount: Small Exudate Type: Serous Exudate Color: amber Foul Odor After Cleansing: No Slough/Fibrino No Wound Bed Granulation Amount: Small (1-33%) Exposed Structure Granulation Quality: Pink Fascia Exposed: No Necrotic Amount: Large (67-100%) Fat Layer (Subcutaneous Tissue) Exposed: Yes Necrotic Quality: Adherent Slough Tendon Exposed: No Muscle Exposed: No Joint Exposed: No Bone Exposed: No Periwound Skin Texture Texture Color No Abnormalities Noted: Yes No Abnormalities Noted: Yes Moisture Temperature / Pain No Abnormalities Noted: Yes Temperature: No Abnormality Treatment Notes Wound #1 (Lower Leg) Wound Laterality: Left Cleanser Soap and Water Discharge Instruction: May shower and wash wound with dial antibacterial soap and water prior to dressing change. Peri-Wound Care Topical Skintegrity Hydrogel 4 (oz) Discharge Instruction: Apply hydrogel as directed DEDREA, DECOURCY B ([id number]) [id number] Page 8 of 8 Primary Dressing Promogran Prisma Matrix, 4.34 (sq in) (silver collagen) Discharge Instruction: Moisten collagen with saline or hydrogel Secondary Dressing Zetuvit Plus Silicone Border Dressing 3x3 (in/in) Discharge Instruction: Apply silicone border over primary dressing as directed. Secured With Compression Wrap Compression Stockings Facilities manager) Signed: 09/15/2023 1:37:16 PM By: Zenaida Deed RN, BSN Entered By: Zenaida Deed on 09/15/2023  09:15:08 -------------------------------------------------------------------------------- Vitals Details Patient Name: Date of Service: Jessica Sos B. 09/15/2023 8:45 A M Medical Record Number: [id number] Patient Account Number: [id number] Date of Birth/Sex: Treating RN: 24-Jan-1941 (83 y.o. F) Primary Care Lazette Estala: Ardean Larsen Other Clinician: Referring Ceazia Harb: Treating Murdock Jellison/Extender: Juventino Slovak, Rinka Weeks in Treatment: 16 Vital Signs Time Taken: 08:38 Temperature (F): 97.5 Height (in): 60 Pulse (bpm): 69 Weight (lbs): 98 Respiratory Rate (breaths/min): 18 Body Mass Index (BMI): 19.1 Blood Pressure (mmHg): 142/86 Reference Range: 80 - 120 mg / dl Electronic Signature(s) Signed: 09/15/2023 1:37:16 PM By: Zenaida Deed RN, BSN Entered By: Zenaida Deed on 09/15/2023 09:11:29

## 2023-09-26 DIAGNOSIS — E038 Other specified hypothyroidism: Secondary | ICD-10-CM | POA: Diagnosis not present

## 2023-09-29 ENCOUNTER — Encounter (HOSPITAL_BASED_OUTPATIENT_CLINIC_OR_DEPARTMENT_OTHER): Payer: Medicare PPO | Admitting: General Surgery

## 2023-09-29 DIAGNOSIS — S81802A Unspecified open wound, left lower leg, initial encounter: Secondary | ICD-10-CM | POA: Diagnosis not present

## 2023-09-29 DIAGNOSIS — L97822 Non-pressure chronic ulcer of other part of left lower leg with fat layer exposed: Secondary | ICD-10-CM | POA: Diagnosis not present

## 2023-11-03 ENCOUNTER — Telehealth: Payer: Self-pay | Admitting: *Deleted

## 2023-11-03 NOTE — Telephone Encounter (Signed)
Ortho bundle 1 year call completed. ?

## 2024-01-31 DIAGNOSIS — H532 Diplopia: Secondary | ICD-10-CM | POA: Diagnosis not present

## 2024-01-31 DIAGNOSIS — H5213 Myopia, bilateral: Secondary | ICD-10-CM | POA: Diagnosis not present

## 2024-01-31 DIAGNOSIS — H401231 Low-tension glaucoma, bilateral, mild stage: Secondary | ICD-10-CM | POA: Diagnosis not present

## 2024-01-31 DIAGNOSIS — H52203 Unspecified astigmatism, bilateral: Secondary | ICD-10-CM | POA: Diagnosis not present

## 2024-02-06 ENCOUNTER — Ambulatory Visit: Admitting: Sports Medicine

## 2024-02-06 ENCOUNTER — Encounter: Payer: Self-pay | Admitting: Sports Medicine

## 2024-02-06 VITALS — BP 114/76 | Ht <= 58 in | Wt 100.0 lb

## 2024-02-06 DIAGNOSIS — R269 Unspecified abnormalities of gait and mobility: Secondary | ICD-10-CM

## 2024-02-06 DIAGNOSIS — Z96642 Presence of left artificial hip joint: Secondary | ICD-10-CM

## 2024-02-06 DIAGNOSIS — R29898 Other symptoms and signs involving the musculoskeletal system: Secondary | ICD-10-CM

## 2024-02-06 DIAGNOSIS — M2170 Unequal limb length (acquired), unspecified site: Secondary | ICD-10-CM

## 2024-02-06 NOTE — Assessment & Plan Note (Signed)
-   The patient has full strength of hip flexion on testing but is not activating her hip flexors on the left when walking.  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  She also has some weakness bilaterally in her hip abduction likely adding to the instability. - Will get her started with formal physical therapy for gait training and strengthening of these muscle groups. - Will follow-up in 6 weeks.

## 2024-02-06 NOTE — Progress Notes (Unsigned)
 Analiza  Jessica [name] - 83 y.o. female MRN [id number]  Date of birth: 1940/09/21  PCP: Elester Grim, MD  Subjective:  No chief complaint on file.  Gait instability  HPI: Past Medical, Surgical, Social, and Family History Reviewed & Updated per EMR.   Patient is a 83 y.o. female here for evaluation of instability in her gait that has caused her to swing her left leg when walking. She has not had any new injuries and has minimal discomfort in the lumbar area and gluts. She denies any numbness, weakness, LOBB.   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     aspirin  81 MG chewable tablet Chew 1 tablet (81 mg total) by mouth 2 (two) times daily. 30 tablet 0   budesonide (ENTOCORT EC) 3 MG 24 hr capsule Take 3-9 mg by mouth See admin instructions. Take 9 mg daily for 6 weeks, then 6 mg daily for 2 weeks, then 3 mg daily for 2 weeks as needed for diarrhea     ezetimibe  (ZETIA ) 10 MG tablet Take 1 tablet (10 mg total) by mouth daily. 90 tablet 3   HYDROcodone -acetaminophen  (NORCO) 5-325 MG tablet Take 1-2 tablets by mouth every 6 (six) hours as needed for moderate pain. 30 tablet 0   hydrocortisone cream 1 % Apply 1 Application topically 2 (two) times daily as needed for itching.     latanoprost  (XALATAN ) 0.005 % ophthalmic solution Place 1 drop into both eyes at bedtime.     levothyroxine  (SYNTHROID ) 112 MCG tablet Take 112 mcg by mouth daily before breakfast.     methocarbamol  (ROBAXIN ) 500 MG tablet Take 1 tablet (500 mg total) by mouth every 6 (six)  hours as needed for muscle spasms. 30 tablet 1   metoprolol  succinate (TOPROL -XL) 25 MG 24 hr tablet Take 25 mg by mouth every evening.  2   ramipril  (ALTACE ) 10 MG capsule Take 10 mg by mouth 2 (two) times daily.     rosuvastatin  (CRESTOR ) 40 MG tablet Take 40 mg by mouth every evening.     No current facility-administered medications on file prior to visit.    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 No signs of distress Skin: No rashes, abrasions, or ecchymosis MSK:  Left Hip: ROM: full Strength Flexion: 5/5, Abduction: 3/5, Adduction: 5/5 Trendelenburg sign positive bilaterally Greater trochanter NT Posterior Hip: FABER neg for SI pain, FADIR neg for anterior hip pain, log roll negative Neuro: no sensory deficits Gait: there is decreased hip flexion and stride length on the left with an outward rotation swing rather than flexion.  Linear scar over ant/lat left hip   Assessment & Plan:   Gait abnormality - The patient has full strength of hip flexion on testing but is not activating her hip flexors on the left when walking.  I believe this is due to proprioceptive change after her hip  replacement.  She also has some weakness bilaterally in her hip abduction likely adding to the instability. - Will get her started with formal physical therapy for gait training and strengthening of these muscle groups. - Will follow-up in 6 weeks.    Berneda Bridges MD [facility] Sports Medicine Fellow

## 2024-02-07 DIAGNOSIS — E782 Mixed hyperlipidemia: Secondary | ICD-10-CM | POA: Diagnosis not present

## 2024-02-07 DIAGNOSIS — E038 Other specified hypothyroidism: Secondary | ICD-10-CM | POA: Diagnosis not present

## 2024-02-07 DIAGNOSIS — I2510 Atherosclerotic heart disease of native coronary artery without angina pectoris: Secondary | ICD-10-CM | POA: Diagnosis not present

## 2024-02-07 DIAGNOSIS — H4089 Other specified glaucoma: Secondary | ICD-10-CM | POA: Diagnosis not present

## 2024-02-07 DIAGNOSIS — R7303 Prediabetes: Secondary | ICD-10-CM | POA: Diagnosis not present

## 2024-02-07 DIAGNOSIS — I700 Atherosclerosis of aorta: Secondary | ICD-10-CM | POA: Diagnosis not present

## 2024-02-07 DIAGNOSIS — I10 Essential (primary) hypertension: Secondary | ICD-10-CM | POA: Diagnosis not present

## 2024-02-08 DIAGNOSIS — M546 Pain in thoracic spine: Secondary | ICD-10-CM | POA: Diagnosis not present

## 2024-02-08 DIAGNOSIS — M542 Cervicalgia: Secondary | ICD-10-CM | POA: Diagnosis not present

## 2024-02-08 DIAGNOSIS — M25559 Pain in unspecified hip: Secondary | ICD-10-CM | POA: Diagnosis not present

## 2024-02-26 DIAGNOSIS — M542 Cervicalgia: Secondary | ICD-10-CM | POA: Diagnosis not present

## 2024-02-26 DIAGNOSIS — M25559 Pain in unspecified hip: Secondary | ICD-10-CM | POA: Diagnosis not present

## 2024-02-26 DIAGNOSIS — M546 Pain in thoracic spine: Secondary | ICD-10-CM | POA: Diagnosis not present

## 2024-02-28 DIAGNOSIS — M542 Cervicalgia: Secondary | ICD-10-CM | POA: Diagnosis not present

## 2024-02-28 DIAGNOSIS — M546 Pain in thoracic spine: Secondary | ICD-10-CM | POA: Diagnosis not present

## 2024-02-28 DIAGNOSIS — M25559 Pain in unspecified hip: Secondary | ICD-10-CM | POA: Diagnosis not present

## 2024-03-02 DIAGNOSIS — N390 Urinary tract infection, site not specified: Secondary | ICD-10-CM | POA: Diagnosis not present

## 2024-03-02 DIAGNOSIS — R3915 Urgency of urination: Secondary | ICD-10-CM | POA: Diagnosis not present

## 2024-03-04 DIAGNOSIS — I10 Essential (primary) hypertension: Secondary | ICD-10-CM | POA: Diagnosis not present

## 2024-03-04 DIAGNOSIS — R1110 Vomiting, unspecified: Secondary | ICD-10-CM | POA: Diagnosis not present

## 2024-03-04 DIAGNOSIS — R197 Diarrhea, unspecified: Secondary | ICD-10-CM | POA: Diagnosis not present

## 2024-03-04 DIAGNOSIS — N390 Urinary tract infection, site not specified: Secondary | ICD-10-CM | POA: Diagnosis not present

## 2024-03-10 DIAGNOSIS — M546 Pain in thoracic spine: Secondary | ICD-10-CM | POA: Diagnosis not present

## 2024-03-10 DIAGNOSIS — M25559 Pain in unspecified hip: Secondary | ICD-10-CM | POA: Diagnosis not present

## 2024-03-10 DIAGNOSIS — M542 Cervicalgia: Secondary | ICD-10-CM | POA: Diagnosis not present

## 2024-03-13 DIAGNOSIS — M542 Cervicalgia: Secondary | ICD-10-CM | POA: Diagnosis not present

## 2024-03-13 DIAGNOSIS — M546 Pain in thoracic spine: Secondary | ICD-10-CM | POA: Diagnosis not present

## 2024-03-13 DIAGNOSIS — M25559 Pain in unspecified hip: Secondary | ICD-10-CM | POA: Diagnosis not present

## 2024-03-26 DIAGNOSIS — M546 Pain in thoracic spine: Secondary | ICD-10-CM | POA: Diagnosis not present

## 2024-03-26 DIAGNOSIS — M542 Cervicalgia: Secondary | ICD-10-CM | POA: Diagnosis not present

## 2024-03-26 DIAGNOSIS — M25559 Pain in unspecified hip: Secondary | ICD-10-CM | POA: Diagnosis not present

## 2024-04-09 DIAGNOSIS — M542 Cervicalgia: Secondary | ICD-10-CM | POA: Diagnosis not present

## 2024-04-09 DIAGNOSIS — M25559 Pain in unspecified hip: Secondary | ICD-10-CM | POA: Diagnosis not present

## 2024-04-09 DIAGNOSIS — M546 Pain in thoracic spine: Secondary | ICD-10-CM | POA: Diagnosis not present

## 2024-04-16 DIAGNOSIS — M25559 Pain in unspecified hip: Secondary | ICD-10-CM | POA: Diagnosis not present

## 2024-04-16 DIAGNOSIS — M546 Pain in thoracic spine: Secondary | ICD-10-CM | POA: Diagnosis not present

## 2024-04-16 DIAGNOSIS — M542 Cervicalgia: Secondary | ICD-10-CM | POA: Diagnosis not present

## 2024-05-14 DIAGNOSIS — M25559 Pain in unspecified hip: Secondary | ICD-10-CM | POA: Diagnosis not present

## 2024-05-14 DIAGNOSIS — M546 Pain in thoracic spine: Secondary | ICD-10-CM | POA: Diagnosis not present

## 2024-05-14 DIAGNOSIS — M542 Cervicalgia: Secondary | ICD-10-CM | POA: Diagnosis not present

## 2024-07-01 ENCOUNTER — Encounter: Payer: Self-pay | Admitting: Radiology

## 2024-07-30 DIAGNOSIS — H401131 Primary open-angle glaucoma, bilateral, mild stage: Secondary | ICD-10-CM | POA: Diagnosis not present

## 2024-07-30 DIAGNOSIS — H532 Diplopia: Secondary | ICD-10-CM | POA: Diagnosis not present
